# Patient Record
Sex: Male | Born: 1950 | Race: White | Hispanic: No | Marital: Married | Smoking: Never smoker
Health system: Southern US, Community
[De-identification: ages and names within clinical notes are randomized; demographics above are authoritative.]

## PROBLEM LIST (undated history)

## (undated) DIAGNOSIS — I214 Non-ST elevation (NSTEMI) myocardial infarction: Secondary | ICD-10-CM

## (undated) DIAGNOSIS — I639 Cerebral infarction, unspecified: Secondary | ICD-10-CM

## (undated) DIAGNOSIS — I10 Essential (primary) hypertension: Secondary | ICD-10-CM

## (undated) DIAGNOSIS — K8020 Calculus of gallbladder without cholecystitis without obstruction: Secondary | ICD-10-CM

## (undated) DIAGNOSIS — I2510 Atherosclerotic heart disease of native coronary artery without angina pectoris: Secondary | ICD-10-CM

## (undated) DIAGNOSIS — Z8711 Personal history of peptic ulcer disease: Secondary | ICD-10-CM

## (undated) DIAGNOSIS — E669 Obesity, unspecified: Secondary | ICD-10-CM

## (undated) DIAGNOSIS — Z951 Presence of aortocoronary bypass graft: Secondary | ICD-10-CM

## (undated) DIAGNOSIS — B351 Tinea unguium: Secondary | ICD-10-CM

## (undated) DIAGNOSIS — R609 Edema, unspecified: Secondary | ICD-10-CM

## (undated) DIAGNOSIS — E7800 Pure hypercholesterolemia, unspecified: Secondary | ICD-10-CM

## (undated) DIAGNOSIS — Z8719 Personal history of other diseases of the digestive system: Secondary | ICD-10-CM

## (undated) DIAGNOSIS — E119 Type 2 diabetes mellitus without complications: Secondary | ICD-10-CM

## (undated) DIAGNOSIS — N184 Chronic kidney disease, stage 4 (severe): Secondary | ICD-10-CM

## (undated) DIAGNOSIS — I509 Heart failure, unspecified: Secondary | ICD-10-CM

## (undated) DIAGNOSIS — M1990 Unspecified osteoarthritis, unspecified site: Secondary | ICD-10-CM

## (undated) HISTORY — PX: COLONOSCOPY: SHX174

## (undated) HISTORY — DX: Obesity, unspecified: E66.9

## (undated) HISTORY — PX: JOINT REPLACEMENT: SHX530

## (undated) HISTORY — DX: Pure hypercholesterolemia, unspecified: E78.00

## (undated) HISTORY — DX: Essential (primary) hypertension: I10

## (undated) HISTORY — PX: CATARACT EXTRACTION W/ INTRAOCULAR LENS  IMPLANT, BILATERAL: SHX1307

## (undated) HISTORY — PX: ESOPHAGOGASTRODUODENOSCOPY: SHX1529

## (undated) HISTORY — DX: Calculus of gallbladder without cholecystitis without obstruction: K80.20

## (undated) HISTORY — PX: FRACTURE SURGERY: SHX138

---

## 1980-02-06 HISTORY — PX: ANKLE FRACTURE SURGERY: SHX122

## 1993-02-05 HISTORY — PX: VASECTOMY: SHX75

## 1998-03-25 ENCOUNTER — Encounter: Admission: RE | Admit: 1998-03-25 | Discharge: 1998-06-23 | Payer: Self-pay | Admitting: Family Medicine

## 1998-11-19 ENCOUNTER — Encounter: Payer: Self-pay | Admitting: Emergency Medicine

## 1998-11-19 ENCOUNTER — Inpatient Hospital Stay: Admission: EM | Admit: 1998-11-19 | Discharge: 1998-11-23 | Payer: Self-pay | Admitting: Emergency Medicine

## 1998-11-20 ENCOUNTER — Encounter: Payer: Self-pay | Admitting: General Surgery

## 1998-11-21 ENCOUNTER — Encounter: Payer: Self-pay | Admitting: General Surgery

## 1998-11-21 ENCOUNTER — Encounter: Payer: Self-pay | Admitting: Orthopedic Surgery

## 1998-11-22 ENCOUNTER — Encounter: Payer: Self-pay | Admitting: Orthopedic Surgery

## 1998-12-07 HISTORY — PX: PATELLA FRACTURE SURGERY: SHX735

## 1999-02-06 DIAGNOSIS — Z951 Presence of aortocoronary bypass graft: Secondary | ICD-10-CM

## 1999-02-06 DIAGNOSIS — I2510 Atherosclerotic heart disease of native coronary artery without angina pectoris: Secondary | ICD-10-CM

## 1999-02-06 HISTORY — DX: Presence of aortocoronary bypass graft: Z95.1

## 1999-02-06 HISTORY — DX: Atherosclerotic heart disease of native coronary artery without angina pectoris: I25.10

## 1999-02-06 HISTORY — PX: CORONARY ARTERY BYPASS GRAFT: SHX141

## 1999-02-09 ENCOUNTER — Ambulatory Visit (HOSPITAL_COMMUNITY): Admission: RE | Admit: 1999-02-09 | Discharge: 1999-02-09 | Payer: Self-pay | Admitting: Cardiology

## 1999-02-09 HISTORY — PX: CARDIAC CATHETERIZATION: SHX172

## 1999-02-17 ENCOUNTER — Encounter: Payer: Self-pay | Admitting: Cardiothoracic Surgery

## 1999-02-20 ENCOUNTER — Encounter: Payer: Self-pay | Admitting: Cardiothoracic Surgery

## 1999-02-21 ENCOUNTER — Inpatient Hospital Stay (HOSPITAL_COMMUNITY): Admission: RE | Admit: 1999-02-21 | Discharge: 1999-02-26 | Payer: Self-pay | Admitting: Cardiothoracic Surgery

## 1999-02-21 ENCOUNTER — Encounter: Payer: Self-pay | Admitting: Thoracic Surgery (Cardiothoracic Vascular Surgery)

## 1999-02-22 ENCOUNTER — Encounter: Payer: Self-pay | Admitting: Thoracic Surgery (Cardiothoracic Vascular Surgery)

## 1999-02-23 ENCOUNTER — Encounter: Payer: Self-pay | Admitting: Cardiothoracic Surgery

## 1999-02-24 ENCOUNTER — Encounter: Payer: Self-pay | Admitting: Cardiothoracic Surgery

## 1999-02-26 ENCOUNTER — Encounter: Payer: Self-pay | Admitting: Surgery

## 1999-04-17 ENCOUNTER — Encounter: Payer: Self-pay | Admitting: Orthopedic Surgery

## 1999-04-20 ENCOUNTER — Ambulatory Visit (HOSPITAL_COMMUNITY): Admission: RE | Admit: 1999-04-20 | Discharge: 1999-04-21 | Payer: Self-pay | Admitting: Orthopedic Surgery

## 2003-08-06 HISTORY — PX: INCISION AND DRAINAGE ABSCESS: SHX5864

## 2003-08-18 ENCOUNTER — Inpatient Hospital Stay (HOSPITAL_COMMUNITY): Admission: AD | Admit: 2003-08-18 | Discharge: 2003-08-20 | Payer: Self-pay

## 2003-10-16 ENCOUNTER — Inpatient Hospital Stay (HOSPITAL_COMMUNITY): Admission: EM | Admit: 2003-10-16 | Discharge: 2003-10-19 | Payer: Self-pay | Admitting: Emergency Medicine

## 2003-10-18 ENCOUNTER — Encounter (INDEPENDENT_AMBULATORY_CARE_PROVIDER_SITE_OTHER): Payer: Self-pay | Admitting: *Deleted

## 2004-02-06 DIAGNOSIS — I639 Cerebral infarction, unspecified: Secondary | ICD-10-CM

## 2004-02-06 HISTORY — DX: Cerebral infarction, unspecified: I63.9

## 2004-04-20 ENCOUNTER — Ambulatory Visit (HOSPITAL_COMMUNITY): Admission: RE | Admit: 2004-04-20 | Discharge: 2004-04-20 | Payer: Self-pay | Admitting: Gastroenterology

## 2007-06-02 ENCOUNTER — Ambulatory Visit (HOSPITAL_COMMUNITY): Admission: RE | Admit: 2007-06-02 | Discharge: 2007-06-02 | Payer: Self-pay | Admitting: Ophthalmology

## 2007-09-22 ENCOUNTER — Ambulatory Visit (HOSPITAL_COMMUNITY): Admission: RE | Admit: 2007-09-22 | Discharge: 2007-09-22 | Payer: Self-pay | Admitting: Ophthalmology

## 2008-06-05 HISTORY — PX: CARDIOVASCULAR STRESS TEST: SHX262

## 2009-12-07 ENCOUNTER — Ambulatory Visit: Payer: Self-pay | Admitting: Cardiology

## 2009-12-14 ENCOUNTER — Ambulatory Visit: Payer: Self-pay

## 2009-12-14 ENCOUNTER — Encounter: Payer: Self-pay | Admitting: Cardiology

## 2009-12-14 ENCOUNTER — Ambulatory Visit: Payer: Self-pay | Admitting: Cardiology

## 2009-12-14 ENCOUNTER — Ambulatory Visit (HOSPITAL_COMMUNITY): Admission: RE | Admit: 2009-12-14 | Discharge: 2009-12-14 | Payer: Self-pay | Admitting: Cardiology

## 2009-12-15 ENCOUNTER — Encounter: Payer: Self-pay | Admitting: Cardiology

## 2010-06-20 NOTE — Op Note (Signed)
NAME:  Justin Bartlett, Justin Bartlett                ACCOUNT NO.:  [id number]   MEDICAL RECORD NO.:  [id number]          PATIENT TYPE:  AMB   LOCATION:  SDS                          FACILITY:  [facility]   PHYSICIAN:  Alford Highland. Rankin, M.D.   DATE OF BIRTH:  12/24/1950   DATE OF PROCEDURE:  06/02/2007  DATE OF DISCHARGE:                               OPERATIVE REPORT   PREOPERATIVE DIAGNOSES:  1. Nuclear sclerotic cataract of the left eye.  2. Posterior subcapsular cataract left eye.   POSTOPERATIVE DIAGNOSES:  1. Nuclear sclerotic cataract of the left eye.  2. Posterior subcapsular cataract left eye.   PROCEDURE:  Phacoemulsification extracapsular cataract extraction with  posterior chamber intraocular lens placed within the bag, left eye.  Model is Alcon SN60WF, power +21.0.   SURGEON:  Alford Highland. Rankin, MD   ANESTHESIA:  Local retrobulbar, monitored anesthesia control.   INDICATION FOR PROCEDURE:  The patient is a 60 year old man who has  impactive visual loss from dense posterior subcapsular cataract and  cataract symptoms that affect his activities of daily living as well as  monitoring of his posterior fundus diabetic retinopathy of the left eye.  The patient understands this is an attempt to remove the cataract and  place the new lens in place.  He understands the risks of anesthesia  including rare occurrence of death, loss of the eye, including but not  limited to hemorrhage, infection, scarring, need for further surgery, no  change in vision, loss of vision, or progressive disease despite  intervention.   Appropriate signed consent was obtained, the patient was taken to the  operating room.  In the operating room, appropriate monitors followed by  mild sedation. Xylocaine 2% injected, 5 mL retrobulbar with initial 3 mL  injected laterally in a fashion of modified Darel Hong.  The left  periocular region was sterilely prepped and draped in the usual  ophthalmic fashion.  Lid speculum applied.   Biplanar clear corneal  incision was then made with a diamond knife.  The anterior chamber was  deepened with Viscoat.  A bent cystotome needle was used to begin the  capsulorrhexis.  The capsulorrhexis needed to be redirected a couple of  times, but it was joined in the capsule and the anterior capsule was  removed without difficulty.  At this time, hydrodissection and  hydrodelineation in the bag was carried out.  Phaco fragmentation was  then carried out on the bag without difficulty and the nucleus was  rotated, so as to make the lens nucleus into a plate.  The plate was  then brought forward and was removed without difficulty. I/A cleanup was  then carried out without difficulty.  No complications occurred.  Lens  was inserted and rotated in horizontal position with excellent  centration and excellent snap support.  Most of the Viscoat, which had  been used deepen the anterior chamber prior to insertion of the lens,  was just now removed with an I/A.  A solitary 10-0 nylon suture was then  placed for security of the corneal wound.   At this time, subconjunctival  Decadron applied inferonasally.  Sterile  patch and Fox shield applied.  The patient taken to the short-stay area,  to be discharged home as an outpatient.      Alford Highland Rankin, M.D.  Electronically Signed     GAR/MEDQ  D:  06/02/2007  T:  06/03/2007  Job:  [id number]

## 2010-06-20 NOTE — Op Note (Signed)
NAME:  Justin Bartlett, Justin Bartlett                ACCOUNT NO.:  [id number]   MEDICAL RECORD NO.:  [id number]          PATIENT TYPE:  AMB   LOCATION:  SDS                          FACILITY:  [facility]   PHYSICIAN:  Alford Highland. Rankin, M.D.   DATE OF BIRTH:  11/20/1950   DATE OF PROCEDURE:  DATE OF DISCHARGE:  09/22/2007                               OPERATIVE REPORT   PREOPERATIVE DIAGNOSES:  1. Clinically significant macular edema, left eye, progressive.  2. Cystoid macular edema, left eye.  3. Progressive proliferative diabetic retinopathy, left eye.  4. Epiretinal membrane, left eye - internal limiting membrane striae.   POSTOPERATIVE DIAGNOSES:  1. Clinically significant macular edema, left eye, progressive.  2. Cystoid macular edema, left eye.  3. Progressive proliferative diabetic retinopathy, left eye.  4. Epiretinal membrane, left eye - internal limiting membrane striae.   PROCEDURE:  Posterior vitrectomy, membrane peel - internal limiting  membrane - 25 gauge.   SURGEON:  Alford Highland. Rankin, MD.   ANESTHESIA:  Local retrobulbar, monitored anesthesia control.   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  The patient __________ attempt to remove the  vitreous carefully as well as the vitreous detachment to the macular  region and any underlying epiretinal membranes so as to allow for  enhanced oxygenation and perfusion to macular region and potentially  visual field stabilization if not improvement.  He understands the risk  of anesthesia, including rate occurrence of death, loss of the eye  including, but not limited to hemorrhage, infection, scarring, need for  another surgery, no change in vision, loss of vision, and progressive  disease  despite intervention.  Appropriate signed consent was obtained.  The patient was taken to the operating room.  In the operating room,  appropriate monitors followed by mild sedation.  2 % Xylocaine and 5 mL  was injected retrobulbar to the left eye, with additional 5 mL laterally  in the fashion of modified Darel Hong.  A left periocular region was  sterilely prepped and draped in the usual sterile fashion.  Lid speculum  applied.  A 25-gauge trocar was placed on the inferotemporal quadrant.  A superior trocar was applied.  Core traction was then begun.  Vitreoretinal detachment was induced by the physician with suction nasal  to the optic nerve.  At this time, the vitreous skirt was then elevated  to 360 degrees to vitreous base.  Endolaser photocoagulation was placed  on the periphery.   At this time, the internal limiting membrane forceps were then used to  grasp the internal limiting membrane.  This was removed off the macular  region in the left eye without difficulty.   No complications occurred.  Instruments were removed from the eye.  The  superior trocar was removed.  The infusion was removed.  Subconjunctival  Decadron applied.  Sterile patch and Caryn Section  shield were applied.  The  patient tolerated the procedure well without complication.      Alford Highland Rankin, M.D.  Electronically Signed     GAR/MEDQ  D:  09/22/2007  T:  09/23/2007  Job:  [id number]

## 2010-06-23 NOTE — Procedures (Signed)
This is a 60 year old gentleman who has sustained a left brain stroke.  He  has had intermittent episodes of aphasia.  The patient is being evaluated  for possible seizure events.  This is a routine EEG.  No skull defects are  noted.  Medications include glipizide, metoprolol, Lasix, Altace, Lipitor  and heparin.   ELECTROENCEPHALOGRAPHY CLASSIFICATION:  Normal awake.   DESCRIPTION:  According to the background rhythm, this recording consists of  a fairly well-modulated, medium amplitude alpha rhythm of 9 Hz.  It is  reactive to eye opening and closure.  As the record progresses, the patient  appears to remain in a awaken state throughout the entirety of the  recording.  Photic stimulation is performed resulting in a bilateral and  symmetric photic drive response.  Hyperventilation is also performed  resulting in minimal build up of the background rhythm activity without  significant slowing seen.  At no time during the recording does there appear  to be evidence of spike, spike wave discharges or evidence of focal slowing.  The EKG monitor shows no evidence of cardiac arrhythmias with a heart rate  of 72.   IMPRESSION:  This is a normal EEG recording in the awaken state.  No  evidence of ictal or intraictal discharges were seen.    Marlan Palau, M.D.   KYH:CWCB  D:  10/18/2003 09:21:05  T:  10/18/2003 10:06:00  Job #:  [id number]

## 2010-06-23 NOTE — H&P (Signed)
NAME:  Justin Bartlett, Justin Bartlett                          ACCOUNT NO.:  [id number]   MEDICAL RECORD NO.:  [id number]                   PATIENT TYPE:  INP   LOCATION:  1825                                 FACILITY:  [facility]   PHYSICIAN:  Catherine A. Orlin Hilding, M.D.          DATE OF BIRTH:  July 19, 1950   DATE OF ADMISSION:  10/16/2003  DATE OF DISCHARGE:                                HISTORY & PHYSICAL   CHIEF COMPLAINT:  Confusion, language deficit.   HISTORY OF PRESENT ILLNESS:  Justin Bartlett is a 60 year old ambidextrous white  male with history of hypertension, diabetes, coronary artery disease, and  hyperlipidemia.  He was in his usual state of health until he awoke from a  routine nap at about 2 to 3 o'clock today.  He seemed confused to his  daughter.  His language was halting, and he had word finding errors, and he  also had trouble comprehending.  Symptoms lasted two to three ours and have  gradually resolved.   REVIEW OF SYSTEMS:  Positive for mild blurred vision, negative for headache,  weakness, numbness, chest pain, or shortness of breath.   PAST MEDICAL HISTORY:  1.  Recent excision of an abscess behind the left ear.  Just discharged two      days ago.  2.  Hypertension.  3.  Type 2 diabetes.  4.  Coronary artery disease status post five-vessel CABG in the past.  5.  Hyperlipidemia.  6.  Remote left knee surgery.  7.  Remote left ankle surgery.   MEDICATIONS:  1.  Glipizide 5 mg a day.  2.  Metoprolol 100 mg b.i.d.  3.  Furosemide 40 mg daily.  4.  Altace 10 mg daily.  5.  Lipitor 40 mg daily.  6.  Aspirin 325 mg daily.   ALLERGIES:  No known drug allergies.   SOCIAL HISTORY:  He is married.  He is employed.  No cigarette or alcohol  use.   FAMILY HISTORY:  Positive for stroke and hypertension.   OBJECTIVE:  VITAL SIGNS:  On physical examination, temperature 98.2, respirations 24,  blood pressure 169/75, pulse 89.   White blood cell count is normal.  Pro time 12.2, INR  0.9.  Hemoglobin,  hematocrit, platelets all normal.  PTT 27.  ISTAT is normal except for  glucose of 324.   HEAD AND NECK: Head is normocephalic and atraumatic.  Neck is supple.  There  is a quiet bruit on the left in ICA region.  He has a healing surgical wound  in the posterior auricular region.  CARDIAC:  Heart is regular rate and rhythm, no murmurs.  LUNGS:  Clear to auscultation.  ABDOMEN:  Benign.  EXTREMITIES:  Without edema.  NEUROLOGIC:  Mental status:  He is awake and alert and oriented.  He mild  language hesitancy with reading and describing a scene.  He is able to name  and repeat without difficulty.  He follows simple commands but has a little  bit of difficulty with complex commands.  Cranial nerves:  His pupils are  equal and reactive. Visual fields are full.  Extraocular movements are  intact.  Facial sensation is normal.  Facial motor activity is normal.  Hearing is intact.  Palate is symmetric, and tongue is midline. On motor  exam, he has no drift, no satellite, normal rapid fine movements.  Normal  bulk, tone, and strength throughout.  Reflexes are 2+ in the upper  extremities and knees, trace at the ankles, downgoing toes.  Coordination:  Finger-to-nose, heel-to-shin are intact.  Sensory exam is intact.   LABORATORY AND X-RAY DATA:  CT scan of the brain shows no definite acute  abnormalities but does show evidence of old small vessel ischemia.   IMPRESSION:  Transient aphasia, transient ischemic attack versus small  stroke.  Need to rule out left carotid disease or perhaps cardiac source.  As he was born left handed, it is also possible he could have something on  the right side.  He also has multiple risk factors for stroke and has been  on aspirin.   PLAN:  1.  Admit to the stroke service for evaluation.  2.  Will put him on Aggrenox.  3.  MRI of the brain.  4.  Extracranial MR angiography.  5.  2-D echocardiogram.  6.  Carotid ultrasound  7.   Homocysteine level.                                                Catherine A. Orlin Hilding, M.D.    CAW/MEDQ  D:  10/16/2003  T:  10/16/2003  Job:  [id number]   cc:   Bryan Lemma. Manus Gunning, M.D.  [address] [place]  [place] [address]  Fax: [phone]
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
## 2010-06-23 NOTE — Discharge Summary (Signed)
NAME:  Justin Bartlett, Justin Bartlett                          ACCOUNT NO.:  [id number]   MEDICAL RECORD NO.:  [id number]                   PATIENT TYPE:  INP   LOCATION:  3034                                 FACILITY:  [facility]   PHYSICIAN:  Pramod P. Pearlean Brownie, MD                 DATE OF BIRTH:  1950-03-28   DATE OF ADMISSION:  10/16/2003  DATE OF DISCHARGE:  10/19/2003                                 DISCHARGE SUMMARY   DISCHARGE DIAGNOSES:  1.  Acute left posterior temporal infarct.  2.  Type 2 diabetes, poorly controlled.  3.  Hypertension.  4.  Coronary artery disease status post five vessel coronary artery bypass      graft in the past.  5.  Hyperlipidemia.  6.  Remote left knee surgery.  7.  Remote left ankle surgery.  8.  Recent excision of abscess behind left ear.   DISCHARGE MEDICATIONS:  1.  Glucotrol 5 mg daily.  2.  Lopressor 100 mg b.i.d.  3.  Altace 10 mg daily.  4.  Lasix 40 mg daily.  5.  Lipitor 40 mg daily.  6.  Aggrenox one p.o. daily x14 days, then increased to b.i.d.   STUDIES PERFORMED:  1.  Chest x-ray.  No active disease.  2.  CT of the head on admission shows chronic small vessel type changes, no      acute findings.  3.  MRI of the brain shows several small foci of acute to subacute      infarctions in the left posterior temporal lobe, chronic microvascular      changes elsewhere in the deep white matter and subcortical hemisphere      white matter.  4.  MRA of the neck shows no evidence of carotid bifurcation on either side.      __________ of the left common carotid artery origin from the arch,      question artifact or stenosis, right vertebral artery severely diseased      and nearly occluded, left vertebral artery shows an origin stenosis 30-      50%.  5.  MRA of the head shows no intracranial anterior circulation disease.      There is a thready distal right vertebral artery probably due to      acquired atherosclerotic disease.  6.  EEG is normal.  7.  EKG  shows normal sinus rhythm with age undetermined of a septal infarct,      T-wave abnormality.  Consider a lateral ischemia with no significant      change since last tracing per cardiology.  8.  2-D echocardiogram shows ejection fraction of 50-55% with left      ventricular mildly dilated, no obvious embolic source.  Poor acoustic      window does limit study.  9.  A transesophageal echocardiogram performed by Dr. Reyes Ivan with no      complications showed  no source of cardioembolic source.  The interatrial      septal wall was intact.  There was no shunting and there was no      vegetation.  10. Carotid Doppler shows left 40-60% in the low to mid range, right no      internal carotid artery stenosis with the right vertebral occluded and      the left vertebral antegrade.   LABORATORIES:  White blood cells up to 10.6 day of discharge up from 8.8,  otherwise CBC normal.  Differential normal.  Coagulation studies normal.  Chemistry normal except for elevated glucoses at 318 and 324.  Liver  function tests normal.  Hemoglobin A1C 11.5.  Homocysteine 8.95.  Cholesterol 143, triglycerides 177, HDL 34, LDL 74.  Urine drug screen was  negative.  UA showed greater than 1000 mg of glucose with a 1.040 specific  gravity, otherwise UA negative.   HISTORY OF PRESENT ILLNESS:  Justin Bartlett is a 60 year old ambidextrous white  male with history of hypertension, diabetes, coronary artery disease, and  hyperlipidemia.  He was in his usual state of health until he woke from his  routine nap on the day of admission some time between 2:00-3:00.  He seemed  confused to his daughter.  His language was halting and he had word finding  errors and also problems comprehending.  Symptoms lasted two to three hours  and have gradually resolved.  He was admitted to the hospital for further  work-up.  He was not a TPA or a [name] candidate secondary to lack of  severity of symptoms.   HOSPITAL COURSE:  MRI did  reveal acute infarcts in the left posterotemporal  area, embolic-appearing.  EKG and telemetry showed no abnormal rhythms.  A  carotid Doppler did show unrelated left ICA stenosis 40-60%.  A TEE and a 2-  D echocardiogram were both negative for embolic source.  As no embolic  source was found, patient was discharged on Aggrenox.  Will start at one a  day and increase to two a day for headache prevention.   Patient is a diabetic and had a hemoglobin A1C that was extremely high.  He  needs tight glucose control and monitoring as an outpatient.  This was  discussed with him and he verbalizes understanding, but finds it difficult  to do.  He will continue on statin for his elevated cholesterol and also  continue tight hypertension control.   CONDITION ON DISCHARGE:  Neurologically normal.  Last aphasia episode was  some time day prior to discharge for one to two minutes.  No focal deficit  otherwise.   PLAN:  1.  Discharged home with family.  2.  Aggrenox for secondary stroke prevention, dose as above.  3.  Follow up with Dr. Manus Bartlett within the next month.  Risk factor control      is imperative, especially as it relates to diabetes.  4.  Follow up with Dr. Delia Heady in two to three months, call for an      appointment.      Annie Main, N.P.                         Pramod P. Pearlean Brownie, MD    SB/MEDQ  D:  10/19/2003  T:  10/19/2003  Job:  [id number]   cc:   Justin Bartlett, M.D.  [address] [place]  [place] [address]  Fax: [phone]

## 2010-06-23 NOTE — Op Note (Signed)
NAME:  Justin Bartlett, Justin Bartlett                ACCOUNT NO.:  [id number]   MEDICAL RECORD NO.:  [id number]          PATIENT TYPE:  AMB   LOCATION:  ENDO                         FACILITY:  [facility]   PHYSICIAN:  James L. Malon Kindle., M.D.DATE OF BIRTH:  July 17, 1950   DATE OF PROCEDURE:  04/20/2004  DATE OF DISCHARGE:                                 OPERATIVE REPORT   PROCEDURE:  Colonoscopy.   MEDICATIONS:  Fentanyl 75 mcg, Versed 10.5 mg IV.   INDICATIONS FOR PROCEDURE:  Strong family history of colon cancer in a  parent.   DESCRIPTION OF PROCEDURE:  The procedure was explained to the patient and  consent obtained.  In the left lateral decubitus position, an Olympus  adjustable scope was inserted and advanced.  We were able to advance easily  to the cecum, ileocecal valve and appendiceal orifice were seen.  The scope  was withdrawn and the cecum, ascending colon, transverse colon, splenic  flexure, descending, and sigmoid colon were seen well.  No polyps or other  lesions were seen.  The scope was withdrawn in the rectum and the rectum was  free of polyps.  The patient tolerated the procedure well.   ASSESSMENT:  1.  Normal colonoscopy in a gentleman with a strong family history of colon      cancer in a parent.  V16.0   PLAN:  Will recommend repeating procedure in 5 years and recommend yearly  hemoccults.      JLE/MEDQ  D:  04/20/2004  T:  04/20/2004  Job:  [id number]   cc:   Bryan Lemma. Manus Gunning, M.D.  [address] [place]  [place] [address]  Fax: [phone]
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
## 2010-06-23 NOTE — Op Note (Signed)
NAME:  Justin Bartlett, Justin Bartlett NO.:  [id number]   MEDICAL RECORD NO.:  [id number]                   PATIENT TYPE:  AMB   LOCATION:  DAY                                  FACILITY:  [facility]   PHYSICIAN:  Lorre Munroe., M.D.            DATE OF BIRTH:  [birth year]   DATE OF PROCEDURE:  08/18/2003  DATE OF DISCHARGE:                                 OPERATIVE REPORT   PREOPERATIVE DIAGNOSIS:  Large abscess on the left side of neck.   POSTOPERATIVE DIAGNOSIS:  Large abscess on the left side of neck.   OPERATION/PROCEDURE:  Incision and drainage of abscess.   SURGEON:  Lebron Conners, M.D.   ANESTHESIA:  General.   DESCRIPTION OF PROCEDURE:  After the patient was monitored and anesthetized  and positioned in the lateral position, I thoroughly explored the abscess.  Pus was draining at one point.  I took a culture.  I plunged the hemostat  into that area and much more pus came out.  I could feel severe induration  in several directions going anteriorly to about the level of the  sternocleidomastoid muscle, posteriorly near the midline, inferiorly near  the clavicle, and cephalad up near the base of the skull.  We made a total  of four incisions and using the hemostat, broke up ramifications and  loculations of the abscess, draining a large amount of pus.  I passed  Penrose drains through and secured them with safety pins.  I applied a large  bulky bandage.  The patient was stable through the procedure.                                               Lorre Munroe., M.D.    WB/MEDQ  D:  08/18/2003  T:  08/18/2003  Job:  [id number]

## 2010-07-06 ENCOUNTER — Ambulatory Visit: Payer: Self-pay | Admitting: Cardiology

## 2010-07-31 ENCOUNTER — Encounter: Payer: Self-pay | Admitting: Cardiology

## 2010-08-07 ENCOUNTER — Ambulatory Visit (INDEPENDENT_AMBULATORY_CARE_PROVIDER_SITE_OTHER): Payer: BC Managed Care – PPO | Admitting: Cardiology

## 2010-08-07 ENCOUNTER — Encounter: Payer: Self-pay | Admitting: Cardiology

## 2010-08-07 VITALS — BP 150/76 | HR 68 | Wt 280.0 lb

## 2010-08-07 DIAGNOSIS — E6609 Other obesity due to excess calories: Secondary | ICD-10-CM | POA: Insufficient documentation

## 2010-08-07 DIAGNOSIS — E119 Type 2 diabetes mellitus without complications: Secondary | ICD-10-CM

## 2010-08-07 DIAGNOSIS — I259 Chronic ischemic heart disease, unspecified: Secondary | ICD-10-CM | POA: Insufficient documentation

## 2010-08-07 DIAGNOSIS — E669 Obesity, unspecified: Secondary | ICD-10-CM

## 2010-08-07 DIAGNOSIS — I119 Hypertensive heart disease without heart failure: Secondary | ICD-10-CM

## 2010-08-07 DIAGNOSIS — E7800 Pure hypercholesterolemia, unspecified: Secondary | ICD-10-CM | POA: Insufficient documentation

## 2010-08-07 DIAGNOSIS — I519 Heart disease, unspecified: Secondary | ICD-10-CM

## 2010-08-07 NOTE — Assessment & Plan Note (Signed)
The patient is now being followed by Dr. Sharl Ma for his diabetes.  The patient is on insulin.  He's not having any hypoglycemic episodes.

## 2010-08-07 NOTE — Assessment & Plan Note (Signed)
The patient unfortunately has gained back a lot of the weight that he was able to lose last year.  When he was on a Nutrisystem diet he lost considerable weight but now his weight is back up 33 pounds in 6 months.

## 2010-08-07 NOTE — Progress Notes (Signed)
Justin Bartlett Date of Birth:  02/07/1950 [facility] [address]   [address] [place], [place]  [address] [phone]           Fax   [phone]  History of Present Illness: This pleasant 60 year old gentleman is seen for a six-month followup office visit.  He is very discouraged that he gained back 33 pounds when he went off a Nutrisystem diet program.  He went off it because they were no longer able to afford it.  He will be speaking with his diabetologist about going on a weight reduction diabetic diet patient has not been expressing any chest pain or shortness of breath.  He's not been aware of any palpitations or arrhythmia.  Since we last saw him he had successful cataract surgery on the left eye by Dr. Alden Hipp per the patient continues to exercise regularly using his treadmill at home.  He does have a known left carotid bruit but has had no TIA symptoms and has had several prior ultrasounds of his carotid by Dr. Pearlean Brownie.  Current Outpatient Prescriptions  Medication Sig Dispense Refill  . diltiazem (CARDIZEM CD) 240 MG 24 hr capsule Take 240 mg by mouth daily.        Marland Kitchen dipyridamole-aspirin (AGGRENOX) 25-200 MG per 12 hr capsule Take 1 capsule by mouth 2 (two) times daily.        Marland Kitchen ezetimibe-simvastatin (VYTORIN) 10-80 MG per tablet Take 1 tablet by mouth at bedtime.        . insulin regular (HUMULIN R,NOVOLIN R) 100 units/mL injection Inject into the skin 3 (three) times daily before meals. 14units tid Dr. Jamison Oka       . metoprolol (LOPRESSOR) 100 MG tablet Take 100 mg by mouth 2 (two) times daily.        . ramipril (ALTACE) 10 MG tablet Take 10 mg by mouth daily.        Marland Kitchen DISCONTD: chlorthalidone (HYGROTON) 25 MG tablet Take 25 mg by mouth daily.        Marland Kitchen DISCONTD: insulin detemir (LEVEMIR) 100 UNIT/ML injection Inject into the skin at bedtime.          No Known Allergies  Patient Active Problem List  Diagnoses  . Ischemic heart disease  . Benign  hypertensive heart disease without heart failure  . Diabetes mellitus  . Exogenous obesity  . Hypercholesterolemia    History  Smoking status  . Never Smoker   Smokeless tobacco  . Not on file    History  Alcohol Use No    Family History  Problem Relation Age of Onset  . Colon cancer Mother   . Heart attack Father   . Hypertension Father   . Hypertension Sister   . Diabetes Sister     Review of Systems: Constitutional: no fever chills diaphoresis or fatigue or change in weight.  Head and neck: no hearing loss, no epistaxis, no photophobia or visual disturbance. Respiratory: No cough, shortness of breath or wheezing. Cardiovascular: No chest pain peripheral edema, palpitations. Gastrointestinal: No abdominal distention, no abdominal pain, no change in bowel habits hematochezia or melena. Genitourinary: No dysuria, no frequency, no urgency, no nocturia. Musculoskeletal:No arthralgias, no back pain, no gait disturbance or myalgias. Neurological: No dizziness, no headaches, no numbness, no seizures, no syncope, no weakness, no tremors. Hematologic: No lymphadenopathy, no easy bruising. Psychiatric: No confusion, no hallucinations, no sleep disturbance.    Physical Exam: Filed Vitals:   08/07/10 1357  BP:  150/76  Pulse: 68  The general appearance reveals a large gentleman in no acute distress.Pupils equal and reactive.   Extraocular Movements are full.  There is no scleral icterus.  The mouth and pharynx are normal.  The neck is supple.  The carotids reveal no bruits.  The jugular venous pressure is normal.  The thyroid is not enlarged.  There is no lymphadenopathy.The chest is clear to percussion and auscultation. There are no rales or rhonchi. Expansion of the chest is symmetrical.The precordium is quiet.  The first heart sound is normal.  The second heart sound is physiologically split.  There is no murmur gallop rub or click.  There is no abnormal lift or heave.  The  abdomen is soft and nontender. Bowel sounds are normal. The liver and spleen are not enlarged. There Are no abdominal masses. There are no bruits.The pedal pulses are good.  There is no phlebitis or edema.  There is no cyanosis or clubbing.Strength is normal and symmetrical in all extremities.  There is no lateralizing weakness.  There are no sensory deficits.The skin is warm and dry.  There is no rash.  His electrogram shows normal sinus rhythm and a pattern of left ventricular strain and an old anteroseptal myocardial infarction.  His been no change since may of 2011. Assessment / Plan:  Continue same medication recheck in 6 months for followup office visit.  He will determined to work harder on a low-calorie Mediterranean type diet

## 2010-08-07 NOTE — Assessment & Plan Note (Signed)
The patient has a past history of known ischemic heart disease.  He had a anteroseptal myocardial infarction in 2001 and underwent coronary artery bypass graft surgery.  His last nuclear stress test was 06/23/08 and showed an old and recent apical infarct with minimal reversible peri-infarct ischemia and he had LV systolic dysfunction with an ejection fraction of 41%.  The patient has not been expressing any recurrent chest pain.  He exercises regularly.

## 2010-08-08 ENCOUNTER — Encounter: Payer: Self-pay | Admitting: Cardiology

## 2010-10-31 LAB — BASIC METABOLIC PANEL
CO2: 27
Calcium: 9.6
GFR calc Af Amer: 48 — ABNORMAL LOW
Glucose, Bld: 161 — ABNORMAL HIGH

## 2010-10-31 LAB — CBC
HCT: 39.8
Hemoglobin: 13.1
MCHC: 32.9
RDW: 13.8

## 2010-11-03 LAB — BASIC METABOLIC PANEL
BUN: 25 — ABNORMAL HIGH
CO2: 25
Chloride: 108
Glucose, Bld: 187 — ABNORMAL HIGH
Potassium: 5.2 — ABNORMAL HIGH

## 2010-11-03 LAB — CBC
HCT: 39.8
MCV: 90.6
Platelets: 200
RDW: 14.1

## 2011-03-05 ENCOUNTER — Encounter: Payer: Self-pay | Admitting: Cardiology

## 2011-03-05 ENCOUNTER — Ambulatory Visit (INDEPENDENT_AMBULATORY_CARE_PROVIDER_SITE_OTHER): Payer: BC Managed Care – PPO | Admitting: Cardiology

## 2011-03-05 VITALS — BP 118/70 | HR 60 | Ht 72.0 in | Wt 283.0 lb

## 2011-03-05 DIAGNOSIS — I259 Chronic ischemic heart disease, unspecified: Secondary | ICD-10-CM

## 2011-03-05 DIAGNOSIS — E7800 Pure hypercholesterolemia, unspecified: Secondary | ICD-10-CM

## 2011-03-05 DIAGNOSIS — E119 Type 2 diabetes mellitus without complications: Secondary | ICD-10-CM

## 2011-03-05 DIAGNOSIS — I119 Hypertensive heart disease without heart failure: Secondary | ICD-10-CM

## 2011-03-05 NOTE — Assessment & Plan Note (Signed)
The patient has a past history of hypercholesterolemia.  He is on Vytorin 10/40/80 one daily.  He is not having any myalgias or side effects from the Vytorin.

## 2011-03-05 NOTE — Assessment & Plan Note (Signed)
The patient denies any hypoglycemic episodes.  He reports that his last A1c was 8.3.

## 2011-03-05 NOTE — Progress Notes (Signed)
Justin Bartlett Date of Birth:  1950-10-22 [facility] [address] [place], [place]  [address] [phone]         Fax   [phone]  History of Present Illness: This pleasant 61 year old gentleman is seen for a six-month followup office visit.  He has a past history of ischemic heart disease.  He had an anteroseptal myocardial infarction in 2001 and underwent coronary artery bypass graft surgery at that time.  His last nuclear stress test was 06/23/08 and showed an old apical infarct with minimal reversible.  Infarct ischemia.  His ejection fraction was 41%.  Patient has a history of diabetes mellitus and is followed by Dr. Sharl Ma and is on insulin.  Patient has had a difficult time with inability to lose weight.  Since we last saw him he has had some problems with retinal disease and has surgery with Dr. Luciana Axe scheduled for next week on his right eye.  Current Outpatient Prescriptions  Medication Sig Dispense Refill  . diltiazem (CARDIZEM CD) 240 MG 24 hr capsule Take 240 mg by mouth daily.        Marland Kitchen dipyridamole-aspirin (AGGRENOX) 25-200 MG per 12 hr capsule Take 1 capsule by mouth 2 (two) times daily.        Marland Kitchen ezetimibe-simvastatin (VYTORIN) 10-80 MG per tablet Take 1 tablet by mouth at bedtime.        . insulin regular (HUMULIN R,NOVOLIN R) 100 units/mL injection Inject into the skin 3 (three) times daily before meals. 14units tid Dr. Jamison Oka       . itraconazole (SPORANOX) 10 MG/ML solution Take 200 mg by mouth daily. As directed      . metoprolol (LOPRESSOR) 100 MG tablet Take 100 mg by mouth 2 (two) times daily.        . ramipril (ALTACE) 10 MG tablet Take 10 mg by mouth daily.        . Insulin Syringe-Needle U-100 (INSULIN SYRINGE .3CC/29GX1/2") 29G X 1/2" 0.3 ML MISC Inject as directed as directed.        No Known Allergies  Patient Active Problem List  Diagnoses  . Ischemic heart disease  . Benign hypertensive heart disease without heart failure  . Diabetes  mellitus  . Exogenous obesity  . Hypercholesterolemia    History  Smoking status  . Never Smoker   Smokeless tobacco  . Not on file    History  Alcohol Use No    Family History  Problem Relation Age of Onset  . Colon cancer Mother   . Heart attack Father   . Hypertension Father   . Hypertension Sister   . Diabetes Sister     Review of Systems: Constitutional: no fever chills diaphoresis or fatigue or change in weight.  Head and neck: no hearing loss, no epistaxis, no photophobia or visual disturbance. Respiratory: No cough, shortness of breath or wheezing. Cardiovascular: No chest pain peripheral edema, palpitations. Gastrointestinal: No abdominal distention, no abdominal pain, no change in bowel habits hematochezia or melena. Genitourinary: No dysuria, no frequency, no urgency, no nocturia. Musculoskeletal:No arthralgias, no back pain, no gait disturbance or myalgias. Neurological: No dizziness, no headaches, no numbness, no seizures, no syncope, no weakness, no tremors. Hematologic: No lymphadenopathy, no easy bruising. Psychiatric: No confusion, no hallucinations, no sleep disturbance.    Physical Exam: Filed Vitals:   03/05/11 0916  BP: 118/70  Pulse: 60   The general appearance reveals an overweight gentleman in no distress.  His weight is up  3 pounds since last visit.Pupils equal and reactive.   Extraocular Movements are full.  There is no scleral icterus.  The mouth and pharynx are normal.  The neck is supple.  The carotids reveal no bruits.  The jugular venous pressure is normal.  The thyroid is not enlarged.  There is no lymphadenopathy.  The chest is clear to percussion and auscultation. There are no rales or rhonchi. Expansion of the chest is symmetrical.  The precordium is quiet.  The first heart sound is normal.  The second heart sound is physiologically split.  There is no murmur gallop rub or click.  There is no abnormal lift or heave.  The abdomen is  soft and nontender. Bowel sounds are normal. The liver and spleen are not enlarged. There Are no abdominal masses. There are no bruits.  Extremities show 1+ ankle edema.Strength is normal and symmetrical in all extremities.  There is no lateralizing weakness.  There are no sensory deficits.  The skin is warm and dry.  There is no rash.   Assessment / Plan: Continue same medication.  Recheck in 6 months for followup office visit and EKG.  Work harder on weight loss.  He will be meeting with Dr. Reyes Ivan nurse educator regarding his diet in several weeks.  He is on a strict low-salt diet and is trying to use a lot of fresh fruits and vegetables.

## 2011-03-05 NOTE — Assessment & Plan Note (Signed)
Patient has a past history of high blood pressure.  He has not been having any dizziness or syncope.

## 2011-03-05 NOTE — Assessment & Plan Note (Signed)
The patient has not been having any recurrent angina pectoris.  He does try to exercise on a regular basis.  He has a treadmill and his urologist and he also has some light weights that he uses.

## 2011-03-05 NOTE — Patient Instructions (Signed)
Your physician recommends that you continue on your current medications as directed. Please refer to the Current Medication list given to you today.  Your physician wants you to follow-up in: 6 months. You will receive a reminder letter in the mail two months in advance. If you don't receive a letter, please call our office to schedule the follow-up appointment.  

## 2011-04-03 ENCOUNTER — Other Ambulatory Visit: Payer: Self-pay | Admitting: Internal Medicine

## 2011-04-06 ENCOUNTER — Ambulatory Visit
Admission: RE | Admit: 2011-04-06 | Discharge: 2011-04-06 | Disposition: A | Discharge status: Home / Self Care | Payer: BC Managed Care – PPO | Source: Ambulatory Visit | Attending: Internal Medicine | Admitting: Internal Medicine

## 2011-05-02 ENCOUNTER — Ambulatory Visit (INDEPENDENT_AMBULATORY_CARE_PROVIDER_SITE_OTHER): Payer: BC Managed Care – PPO | Admitting: General Surgery

## 2011-05-02 ENCOUNTER — Encounter (INDEPENDENT_AMBULATORY_CARE_PROVIDER_SITE_OTHER): Payer: Self-pay | Admitting: General Surgery

## 2011-05-02 VITALS — BP 138/90 | HR 68 | Temp 97.1°F | Resp 16 | Ht 71.0 in | Wt 265.6 lb

## 2011-05-02 DIAGNOSIS — R7989 Other specified abnormal findings of blood chemistry: Secondary | ICD-10-CM | POA: Insufficient documentation

## 2011-05-02 NOTE — Progress Notes (Signed)
Patient ID: Justin Bartlett, male   DOB: 11/23/1950, 61 y.o.   MRN: [id number]  Chief Complaint  Patient presents with  . Abdominal Pain    new pt- ebal gallstones    HPI Justin Bartlett is a 61 y.o. male.   HPI 61 year old obese Caucasian male referred by Dr. Manus Gunning for evaluation of gallstones. The patient had routine lab work done in end of February which revealed some elevated liver function tests. This prompted additional workup. He had an abdominal ultrasound which showed a gallstone. He denies any abdominal pain. He denies any epigastric or right upper quadrant pain. He denies any bloating or indigestion. He denies any postprandial abdominal pain. He denies any colicky abdominal pain. He denies any weight loss. He states the only thing that he has noticed recently is that he is having more episodes of loose stool. He states he also has been having some itchy skin. His mother has had colon cancer. He reports having several colonoscopies in the past which have been normal. He denies any reflux.  Past Medical History  Diagnosis Date  . Hypertension   . Diabetes mellitus     insulin dependent  . Coronary artery disease   . Gallstones   . Hypercholesterolemia   . Chronic kidney disease     Past Surgical History  Procedure Date  . Cardiac catheterization 02/09/1999    LARGE AREA OF ANTERIOR AND APICAL  HYPOKINESIS PRESENT. EF 35%  . Cardiovascular stress test 06/2008    EF 41%  . Coronary artery bypass graft 2001  . Ankle surgery     Family History  Problem Relation Age of Onset  . Colon cancer Mother   . Cancer Mother     colon  . Heart attack Father   . Hypertension Father   . Hypertension Sister   . Diabetes Sister     Social History History  Substance Use Topics  . Smoking status: Never Smoker   . Smokeless tobacco: Not on file  . Alcohol Use: No    No Known Allergies  Current Outpatient Prescriptions  Medication Sig Dispense Refill  . diltiazem (CARDIZEM CD)  240 MG 24 hr capsule Take 240 mg by mouth daily.        Marland Kitchen dipyridamole-aspirin (AGGRENOX) 25-200 MG per 12 hr capsule Take 1 capsule by mouth 2 (two) times daily.        Marland Kitchen ezetimibe-simvastatin (VYTORIN) 10-80 MG per tablet Take 1 tablet by mouth at bedtime.        . insulin regular (HUMULIN R,NOVOLIN R) 100 units/mL injection Inject into the skin 3 (three) times daily before meals. 14units tid Dr. Jamison Oka       . Insulin Syringe-Needle U-100 (INSULIN SYRINGE .3CC/29GX1/2") 29G X 1/2" 0.3 ML MISC Inject as directed as directed.      . itraconazole (SPORANOX) 10 MG/ML solution 200 mg daily. As directed      . metoprolol (LOPRESSOR) 100 MG tablet Take 100 mg by mouth 2 (two) times daily.        . ramipril (ALTACE) 10 MG tablet Take 10 mg by mouth daily.          Review of Systems Review of Systems  Constitutional: Negative for fever, chills, appetite change and unexpected weight change.  HENT: Negative for congestion and trouble swallowing.   Eyes: Negative for visual disturbance.  Respiratory: Negative for chest tightness and shortness of breath.   Cardiovascular: Negative for chest pain and leg swelling.  No PND, no orthopnea, no DOE  Gastrointestinal:       See HPI  Genitourinary: Negative for dysuria and hematuria.  Musculoskeletal: Negative.   Skin: Negative for rash.       Itchy skin  Neurological: Negative for seizures and speech difficulty.  Hematological: Does not bruise/bleed easily.       Has had more hypoglycemic episodes recently. States his blood sugars are sometimes in the 40s when he gets up in the morning  Psychiatric/Behavioral: Negative for behavioral problems and confusion.    Blood pressure 138/90, pulse 68, temperature 97.1 F (36.2 C), temperature source Temporal, resp. rate 16, height 5\' 11"  (1.803 m), weight 265 lb 9.6 oz (120.475 kg).  Physical Exam Physical Exam  Vitals reviewed. Constitutional: He is oriented to person, place, and time. He appears  well-developed and well-nourished. No distress.       Obese, central truncal obesity  HENT:  Head: Normocephalic and atraumatic.  Right Ear: External ear normal.  Left Ear: External ear normal.  Eyes: EOM are normal. Scleral icterus (possible mild scleral icterus) is present.  Neck: Neck supple. No tracheal deviation present. No thyromegaly present.  Cardiovascular: Normal rate, regular rhythm and normal heart sounds.   Pulmonary/Chest: Effort normal and breath sounds normal. No respiratory distress. He has no wheezes.    Abdominal: Soft. Bowel sounds are normal. He exhibits no distension. There is no tenderness.       obese  Musculoskeletal: Normal range of motion. He exhibits no edema and no tenderness.  Neurological: He is alert and oriented to person, place, and time.  Skin: Skin is dry. He is not diaphoretic.       ?bronze skin  Psychiatric: He has a normal mood and affect. His behavior is normal. Judgment and thought content normal.    Data Reviewed Dr Randel Books note from 2/22 Dr Daune Perch note from 1/22 Normal LFTs 09/27/10 LFTs 03/30/11:   tbili 1.1; alkphos 414, AST 98, ALT 154 Reportedly hep A, hep B, hep C were negative-although I do not have a copy of these  Assessment    Cholelithiasis Elevated LFTs    Plan    I believe he has asymptomatic cholelithiasis. I do not believe his elevated LFTs are due to gallbladder disease. He has no symptoms which I can elicit that are related to gallbladder disease. I believe his elevated LFTs may be due to fatty liver infiltration due to his obesity or possibly secondary to a medication. I will refer him to Dr Vilinda Boehringer for evaluation.   F/u prn [name]. [name], MD, FACS General, Bariatric, & Minimally Invasive Surgery [facility], [place]        [facility] M 05/02/2011, 10:15 AM

## 2011-05-02 NOTE — Patient Instructions (Signed)
I do not believe your gallbladder is the source of your elevated LFTs. We will make an appt to see Dr Randa Evens

## 2011-06-01 ENCOUNTER — Other Ambulatory Visit: Payer: Self-pay | Admitting: Gastroenterology

## 2011-06-01 DIAGNOSIS — R945 Abnormal results of liver function studies: Secondary | ICD-10-CM

## 2011-06-07 ENCOUNTER — Ambulatory Visit
Admission: RE | Admit: 2011-06-07 | Discharge: 2011-06-07 | Disposition: A | Discharge status: Home / Self Care | Payer: BC Managed Care – PPO | Source: Ambulatory Visit | Attending: Gastroenterology | Admitting: Gastroenterology

## 2011-06-07 DIAGNOSIS — R945 Abnormal results of liver function studies: Secondary | ICD-10-CM

## 2011-06-07 MED ORDER — GADOBENATE DIMEGLUMINE 529 MG/ML IV SOLN
10.0000 mL | Freq: Once | INTRAVENOUS | Status: AC | PRN
  Administered 2011-06-07: 10 mL via INTRAVENOUS

## 2011-06-13 ENCOUNTER — Encounter (HOSPITAL_COMMUNITY)
Admission: RE | Disposition: A | Discharge status: Home / Self Care | Payer: Self-pay | Source: Ambulatory Visit | Attending: Gastroenterology

## 2011-06-13 ENCOUNTER — Encounter (HOSPITAL_COMMUNITY): Payer: Self-pay | Admitting: Anesthesiology

## 2011-06-13 ENCOUNTER — Ambulatory Visit (HOSPITAL_COMMUNITY): Payer: BC Managed Care – PPO | Admitting: Anesthesiology

## 2011-06-13 ENCOUNTER — Encounter (HOSPITAL_COMMUNITY): Payer: Self-pay | Admitting: *Deleted

## 2011-06-13 ENCOUNTER — Ambulatory Visit (HOSPITAL_COMMUNITY): Payer: BC Managed Care – PPO

## 2011-06-13 ENCOUNTER — Ambulatory Visit (HOSPITAL_COMMUNITY)
Admission: RE | Admit: 2011-06-13 | Discharge: 2011-06-13 | Disposition: A | Discharge status: Home / Self Care | Payer: BC Managed Care – PPO | Source: Ambulatory Visit | Attending: Gastroenterology | Admitting: Gastroenterology

## 2011-06-13 DIAGNOSIS — K8050 Calculus of bile duct without cholangitis or cholecystitis without obstruction: Secondary | ICD-10-CM | POA: Insufficient documentation

## 2011-06-13 DIAGNOSIS — I10 Essential (primary) hypertension: Secondary | ICD-10-CM | POA: Insufficient documentation

## 2011-06-13 DIAGNOSIS — R7989 Other specified abnormal findings of blood chemistry: Secondary | ICD-10-CM | POA: Insufficient documentation

## 2011-06-13 DIAGNOSIS — I2510 Atherosclerotic heart disease of native coronary artery without angina pectoris: Secondary | ICD-10-CM | POA: Insufficient documentation

## 2011-06-13 DIAGNOSIS — E119 Type 2 diabetes mellitus without complications: Secondary | ICD-10-CM | POA: Insufficient documentation

## 2011-06-13 DIAGNOSIS — Z794 Long term (current) use of insulin: Secondary | ICD-10-CM | POA: Insufficient documentation

## 2011-06-13 HISTORY — DX: Personal history of other diseases of the digestive system: Z87.19

## 2011-06-13 HISTORY — DX: Personal history of peptic ulcer disease: Z87.11

## 2011-06-13 HISTORY — PX: BILIARY STENT PLACEMENT: SHX5538

## 2011-06-13 HISTORY — PX: ERCP: SHX5425

## 2011-06-13 HISTORY — PX: SPHINCTEROTOMY: SHX5544

## 2011-06-13 HISTORY — DX: Cerebral infarction, unspecified: I63.9

## 2011-06-13 LAB — GLUCOSE, CAPILLARY
Glucose-Capillary: 153 mg/dL — ABNORMAL HIGH (ref 70–99)
Glucose-Capillary: 212 mg/dL — ABNORMAL HIGH (ref 70–99)
Glucose-Capillary: 227 mg/dL — ABNORMAL HIGH (ref 70–99)

## 2011-06-13 SURGERY — ERCP, WITH INTERVENTION IF INDICATED
Anesthesia: Monitor Anesthesia Care

## 2011-06-13 MED ORDER — LACTATED RINGERS IV SOLN
INTRAVENOUS | Status: DC
  Administered 2011-06-13: 10:00:00 via INTRAVENOUS

## 2011-06-13 MED ORDER — GLUCAGON HCL (RDNA) 1 MG IJ SOLR
INTRAMUSCULAR | Status: AC
  Filled 2011-06-13: qty 2

## 2011-06-13 MED ORDER — SODIUM CHLORIDE 0.9 % IV SOLN
1.5000 g | INTRAVENOUS | Status: AC
  Administered 2011-06-13: 1.5 g via INTRAVENOUS
  Filled 2011-06-13: qty 1.5

## 2011-06-13 MED ORDER — KETAMINE HCL 10 MG/ML IJ SOLN
INTRAMUSCULAR | Status: DC | PRN
  Administered 2011-06-13: 40 mg via INTRAVENOUS
  Administered 2011-06-13: 20 mg via INTRAVENOUS

## 2011-06-13 MED ORDER — SODIUM CHLORIDE 0.9 % IV SOLN
INTRAVENOUS | Status: AC
  Filled 2011-06-13: qty 1.5

## 2011-06-13 MED ORDER — BUTAMBEN-TETRACAINE-BENZOCAINE 2-2-14 % EX AERO
INHALATION_SPRAY | CUTANEOUS | Status: DC | PRN
  Administered 2011-06-13: 2 via TOPICAL

## 2011-06-13 MED ORDER — INSULIN ASPART 100 UNIT/ML ~~LOC~~ SOLN
0.0000 [IU] | SUBCUTANEOUS | Status: DC
  Administered 2011-06-13: 2 [IU] via SUBCUTANEOUS
  Filled 2011-06-13: qty 3

## 2011-06-13 MED ORDER — LIDOCAINE HCL (CARDIAC) 20 MG/ML IV SOLN
INTRAVENOUS | Status: DC | PRN
  Administered 2011-06-13: 100 mg via INTRAVENOUS

## 2011-06-13 MED ORDER — PROPOFOL 10 MG/ML IV EMUL
INTRAVENOUS | Status: DC | PRN
  Administered 2011-06-13: 140 ug/kg/min via INTRAVENOUS

## 2011-06-13 MED ORDER — SODIUM CHLORIDE 0.9 % IV SOLN
INTRAVENOUS | Status: DC | PRN
  Administered 2011-06-13: 14:00:00 via INTRAVENOUS

## 2011-06-13 MED ORDER — FENTANYL CITRATE 0.05 MG/ML IJ SOLN: 25.0000 ug | INTRAMUSCULAR | Status: DC | PRN

## 2011-06-13 MED ORDER — EPINEPHRINE HCL 0.1 MG/ML IJ SOLN
INTRAMUSCULAR | Status: AC
  Filled 2011-06-13: qty 10

## 2011-06-13 MED ORDER — ASPIRIN-DIPYRIDAMOLE ER 25-200 MG PO CP12: 1.0000 | ORAL_CAPSULE | Freq: Two times a day (BID) | ORAL | Status: DC

## 2011-06-13 MED ORDER — GLYCOPYRROLATE 0.2 MG/ML IJ SOLN
INTRAMUSCULAR | Status: DC | PRN
  Administered 2011-06-13: 0.2 mg via INTRAVENOUS

## 2011-06-13 MED ORDER — GLUCAGON HCL (RDNA) 1 MG IJ SOLR
INTRAMUSCULAR | Status: DC | PRN
  Administered 2011-06-13 (×2): .5 mg via INTRAVENOUS
  Administered 2011-06-13: 1 mg via INTRAVENOUS

## 2011-06-13 MED ORDER — SODIUM CHLORIDE 0.9 % IJ SOLN
INTRAMUSCULAR | Status: DC | PRN
  Administered 2011-06-13: 13:00:00

## 2011-06-13 MED ORDER — MIDAZOLAM HCL 5 MG/5ML IJ SOLN
INTRAMUSCULAR | Status: DC | PRN
  Administered 2011-06-13 (×2): 1 mg via INTRAVENOUS

## 2011-06-13 MED ORDER — FENTANYL CITRATE 0.05 MG/ML IJ SOLN
INTRAMUSCULAR | Status: DC | PRN
  Administered 2011-06-13 (×3): 25 ug via INTRAVENOUS
  Administered 2011-06-13 (×2): 50 ug via INTRAVENOUS
  Administered 2011-06-13: 25 ug via INTRAVENOUS

## 2011-06-13 NOTE — H&P (Signed)
Patient interval history reviewed.  Patient examined again.  There has been no change from documented H/P dated 05/29/11 (scanned into chart from our office) except as documented above.  Assessment: 1.  Elevated LFTs. 2.  Cystic duct stone. 3.  Bile duct stones.  Plan: 1.  Endoscopic retrograde pancreatography with anticipated biliary sphincterotomy and stone extraction.  Patient is aware of slight increase risk of post-sphincterotomy bleeding, as he has been off Aggrenox (off x 48 hours). 2.  Endoscopic Retrograde Cholangiopancreatography (ERCP) 3.  Risks (up to and including bleeding, infection, perforation, pancreatitis that can be complicated by infected necrosis and death), benefits (removal of stones, alleviating blockage, decreasing risk of cholangitis or choledocholithiasis-related pancreatitis), and alternatives (watchful waiting, percutaneous transhepatic cholangiography) of ERCP were explained to patient in detail and patient elects to proceed.

## 2011-06-13 NOTE — Discharge Instructions (Signed)
Endoscopic Retrograde Cholangiopancreatography (ERCP) °ERCP stands for endoscopic retrograde cholangiopancreatography. In this procedure, a thin, lighted tube (endoscope) is used. It is passed through the mouth and down the back of the throat into the upper part of the intestine, called the duodenum. A small, plastic tube (cannula) is then passed through the endoscope. It is directed into the bile duct or pancreatic duct. Dye is then injected through the tube. X-rays are taken to study the biliary and pancreatic passageways. This procedure is used to diagnosis many diseases of the pancreas, bile ducts, liver, and gallbladder. °LET YOUR CAREGIVER KNOW ABOUT:  °· Allergies to food or medicine.  °· Medicines taken, including vitamins, herbs, eyedrops, over-the-counter medicines, and creams.  °· Use of steroids (by mouth or creams).  °· Previous problems with anesthetics or numbing medicines.  °· History of bleeding problems or blood clots.  °· Previous surgery.  °· Other health problems, including diabetes and kidney problems.  °· Possibility of pregnancy, if this applies.  °· Any barium X-rays during the past week.  °BEFORE THE PROCEDURE  °· Do not eat or drink anything, including water, for at least 6 hours before the procedure.  °· Ask your caregiver whether you should stop taking certain medicines prior to your procedure.  °· Arrange for someone to drive you home. You will not be allowed to drive for several hours after the procedure.  °· Arrive at least 60 minutes before the procedure or as directed. This will give you time to check in and fill out any necessary paperwork.  °PROCEDURE  °· You will be given medicine through a vein (intravenously) to make you relaxed and sleepy.  °· You might have a breathing tube placed to give you medicine that makes you sleep (general anesthetic).  °· Your throat may be sprayed with medicine that numbs the area (local anesthetic).  °· You will lie on your left side. The endoscope  will be inserted through your mouth and into the duodenum. The tube will not interfere with your breathing. Gagging is prevented by the anesthesia.  °· While X-rays are being taken, you may be positioned on your stomach.  °During the ERCP, the person performing the procedure may identify a blockage or narrowed opening to the bile duct. A muscular portion of the main bile duct may be partially cut (sphincterotomy). A thin, plastic tube (stent) will be positioned inside your bile duct. This is to allow fluid secreted by the liver (bile) to flow more easily through the narrowed opening. °AFTER THE PROCEDURE  °· You will rest in bed until you are fully conscious.  °· When you first wake up, your throat may feel slightly sore.  °· You will not be allowed to eat or drink until numbness subsides.  °· Once you are able to drink, urinate, and sit on the edge of the bed without feeling sick to your stomach (nauseous) or dizzy, you may be allowed to go home.  °· Have a friend or family member with you for the first 24 hours after your procedure.  °SEEK MEDICAL CARE IF:  °· You have an oral temperature above 102° F (38.9° C).  °· You develop other signs of infection, including chills or feeling unwell.  °· You have abdominal pain.  °· You have questions or concerns.  °MAKE SURE YOU:  °· Understand these instructions.  °· Will watch your condition.  °· Will get help right away if you are not doing well or get worse.  °  Document Released: 10/17/2000 Document Revised: 10/04/2010 Document Reviewed: 05/10/2009 °[employer]® Patient Information ©2012 [employer]. ° °

## 2011-06-13 NOTE — Transfer of Care (Signed)
Immediate Anesthesia Transfer of Care Note  Patient: Justin Bartlett  Procedure(s) Performed: Procedure(s) (LRB): ENDOSCOPIC RETROGRADE CHOLANGIOPANCREATOGRAPHY (ERCP) (N/A) SPHINCTEROTOMY () BILIARY STENT PLACEMENT (N/A)  Patient Location: PACU  Anesthesia Type: MAC  Level of Consciousness: sedated  Airway & Oxygen Therapy: Patient Spontanous Breathing and Patient connected to nasal cannula oxygen  Post-op Assessment: Report given to PACU RN and Post -op Vital signs reviewed and stable  Post vital signs: Reviewed and stable  Complications: No apparent anesthesia complications

## 2011-06-13 NOTE — Op Note (Signed)
[facility] [address] [place], [place]  [address]  ERCP PROCEDURE REPORT  PATIENT:  Justin Bartlett, Justin Bartlett  MR#:  [id number] BIRTHDATE:  December 03, 1950  GENDER:  male  ENDOSCOPIST:  Willis Modena, MD ASSISTANT:  Rolanda Lundborg, Jimmey Ralph, RN, CGRN, Dwain Sarna, RN CGRN  PROCEDURE DATE:  06/13/2011 PROCEDURE:  ERCP with stent placement, ERCP with sphincterotomy ASA CLASS:  Class III  INDICATIONS:  elevated LFTs, choledocholithiasis (on MRCP)  MEDICATIONS:  MAC sedation, administered by CRNA, Unasyn 1.5 g iv, Cetacaine spray x 2  DESCRIPTION OF PROCEDURE:   After the risks benefits and alternatives of the procedure were thoroughly explained, informed consent was obtained.  The Pentax ERCP [id number] endoscope was introduced through the mouth and advanced to the second portion of the duodenum.  The examination was normal with no endoscopic findings.  The scope was then completely withdrawn from the patient and the procedure terminated.  <<PROCEDUREIMAGES>>  FINDINGS:  J-shaped stomach, requiring placement of patient on left side in order to intubate the duodenum.  Patulous ampulla, with scant biliary drainage, near a small periampullary diverticulum.  Guidewire preferentially went into pancreatic duct; after small amount of contrast injection to confirm location in the pancreatic duct, which showed normal pancreatogram, 5Fr 3cm double flanged (internal + external) pancreatic duct stent was placed with appropriate positioning confirmed endoscopically and fluoroscopically.  With assistance from my partner Dr. Vida Rigger, the bile duct was ultimately cannulated.  CBD   10mm with small mid-cbd filling defect as well as impacted distal bile duct stone of approximately 10 x 10 mm in size.  Generous biliary sphincterotomy was performed with blended current.  Despite multiple attempts with both balloon and basket cathethers, I was unable to remove the stone.  8.5Fr 5cm  biliary stent was placed to complete the procedure.  There was good bile flow through stent post-procedure.  ENDOSCOPIC IMPRESSION:    1.  Impacted distal bile duct stone, biliary sphincterotomy and biliary + pancreatic duct stent placement as above.  RECOMMENDATIONS:      1.  Watch for potential complications of procedure. 2.  Ursodiol x 4-6 weeks, with subsequent repeat ERCP (with availability of SpyGlass, if needed). 3.  Hold Aggrenox (and other NSAIDs) for another 5 days, post-sphincterotomy. 4.  Follow-up with [place] in 2-3 weeks.  ______________________________ Willis Modena  CC:  n. eSIGNEDWillis Modena at 06/13/2011 01:44 PM  Chauncey Fischer, [phone]

## 2011-06-13 NOTE — Anesthesia Preprocedure Evaluation (Signed)
Anesthesia Evaluation  Patient identified by MRN, date of birth, ID band Patient awake    Reviewed: Allergy & Precautions, H&P , NPO status , Patient's Chart, lab work & pertinent test results, reviewed documented beta blocker date and time   Airway Mallampati: II TM Distance: >3 FB Neck ROM: Full    Dental  (+) Upper Dentures and Partial Lower   Pulmonary neg pulmonary ROS,  breath sounds clear to auscultation        Cardiovascular hypertension, Pt. on medications + CAD Rhythm:Regular Rate:Normal  CAD, s/p CABG 2001 Currently asymptomatic, good exercise tolerance Cards office visit 2 months ago-stable CAD   Neuro/Psych negative neurological ROS  negative psych ROS   GI/Hepatic Neg liver ROS, Common duct stone   Endo/Other  Diabetes mellitus-, Type 2, Insulin DependentMorbid obesityFair glucose control  Renal/GU negative Renal ROS  negative genitourinary   Musculoskeletal negative musculoskeletal ROS (+)   Abdominal   Peds negative pediatric ROS (+)  Hematology negative hematology ROS (+)   Anesthesia Other Findings   Reproductive/Obstetrics negative OB ROS                           Anesthesia Physical Anesthesia Plan  ASA: III  Anesthesia Plan: MAC   Post-op Pain Management:    Induction: Intravenous  Airway Management Planned: Mask  Additional Equipment:   Intra-op Plan:   Post-operative Plan:   Informed Consent: I have reviewed the patients History and Physical, chart, labs and discussed the procedure including the risks, benefits and alternatives for the proposed anesthesia with the patient or authorized representative who has indicated his/her understanding and acceptance.   Dental advisory given  Plan Discussed with: CRNA and Surgeon  Anesthesia Plan Comments:         Anesthesia Quick Evaluation

## 2011-06-13 NOTE — Preoperative (Signed)
Beta Blockers   Reason not to administer Beta Blockers:Not Applicable 

## 2011-06-13 NOTE — Anesthesia Postprocedure Evaluation (Signed)
  Anesthesia Post-op Note  Patient: Justin Bartlett  Procedure(s) Performed: Procedure(s) (LRB): ENDOSCOPIC RETROGRADE CHOLANGIOPANCREATOGRAPHY (ERCP) (N/A) SPHINCTEROTOMY () BILIARY STENT PLACEMENT (N/A)  Patient Location: PACU  Anesthesia Type: General  Level of Consciousness: oriented and sedated  Airway and Oxygen Therapy: Patient Spontanous Breathing and Patient connected to nasal cannula oxygen  Post-op Pain: mild  Post-op Assessment: Post-op Vital signs reviewed, Patient's Cardiovascular Status Stable, Respiratory Function Stable and Patent Airway  Post-op Vital Signs: stable  Complications: No apparent anesthesia complications

## 2011-06-13 NOTE — OR Nursing (Signed)
Pt CBG is 148 pre-procedure for ERCP, Dr. Shireen Quan requested SSI.  Verified with Dr. Shireen Quan that pt is to receive 2 units of Novolog before ERCP, since pt states he "drops easily".  Stated to give pt 2 units Novolog as ordered.  Will continue to monitor pt and recheck CBG after procedure and as needed. Angelique Blonder, RN

## 2011-06-14 ENCOUNTER — Encounter (HOSPITAL_COMMUNITY): Payer: Self-pay | Admitting: Gastroenterology

## 2011-06-21 ENCOUNTER — Other Ambulatory Visit: Payer: Self-pay | Admitting: Gastroenterology

## 2011-06-21 ENCOUNTER — Ambulatory Visit
Admission: RE | Admit: 2011-06-21 | Discharge: 2011-06-21 | Disposition: A | Discharge status: Home / Self Care | Payer: BC Managed Care – PPO | Source: Ambulatory Visit | Attending: Gastroenterology | Admitting: Gastroenterology

## 2011-07-26 ENCOUNTER — Encounter (INDEPENDENT_AMBULATORY_CARE_PROVIDER_SITE_OTHER): Payer: Self-pay

## 2011-08-21 ENCOUNTER — Encounter (HOSPITAL_COMMUNITY): Payer: Self-pay

## 2011-08-21 ENCOUNTER — Encounter (HOSPITAL_COMMUNITY)
Admission: RE | Admit: 2011-08-21 | Discharge: 2011-08-21 | Disposition: A | Discharge status: Home / Self Care | Payer: BC Managed Care – PPO | Source: Ambulatory Visit | Attending: Gastroenterology | Admitting: Gastroenterology

## 2011-08-21 HISTORY — DX: Tinea unguium: B35.1

## 2011-08-22 ENCOUNTER — Ambulatory Visit (HOSPITAL_COMMUNITY): Payer: BC Managed Care – PPO | Admitting: *Deleted

## 2011-08-22 ENCOUNTER — Encounter (HOSPITAL_COMMUNITY): Payer: Self-pay | Admitting: *Deleted

## 2011-08-22 ENCOUNTER — Ambulatory Visit (HOSPITAL_COMMUNITY): Payer: BC Managed Care – PPO

## 2011-08-22 ENCOUNTER — Ambulatory Visit (HOSPITAL_COMMUNITY)
Admission: RE | Admit: 2011-08-22 | Discharge: 2011-08-22 | Disposition: A | Discharge status: Home Health | Payer: BC Managed Care – PPO | Source: Ambulatory Visit | Attending: Gastroenterology | Admitting: Gastroenterology

## 2011-08-22 ENCOUNTER — Encounter (HOSPITAL_COMMUNITY)
Admission: RE | Disposition: A | Discharge status: Home Health | Payer: Self-pay | Source: Ambulatory Visit | Attending: Gastroenterology

## 2011-08-22 DIAGNOSIS — I2510 Atherosclerotic heart disease of native coronary artery without angina pectoris: Secondary | ICD-10-CM | POA: Insufficient documentation

## 2011-08-22 DIAGNOSIS — K219 Gastro-esophageal reflux disease without esophagitis: Secondary | ICD-10-CM | POA: Insufficient documentation

## 2011-08-22 DIAGNOSIS — Z794 Long term (current) use of insulin: Secondary | ICD-10-CM | POA: Insufficient documentation

## 2011-08-22 DIAGNOSIS — I129 Hypertensive chronic kidney disease with stage 1 through stage 4 chronic kidney disease, or unspecified chronic kidney disease: Secondary | ICD-10-CM | POA: Insufficient documentation

## 2011-08-22 DIAGNOSIS — K8050 Calculus of bile duct without cholangitis or cholecystitis without obstruction: Secondary | ICD-10-CM | POA: Insufficient documentation

## 2011-08-22 DIAGNOSIS — E119 Type 2 diabetes mellitus without complications: Secondary | ICD-10-CM | POA: Insufficient documentation

## 2011-08-22 DIAGNOSIS — E7800 Pure hypercholesterolemia, unspecified: Secondary | ICD-10-CM | POA: Insufficient documentation

## 2011-08-22 DIAGNOSIS — Z79899 Other long term (current) drug therapy: Secondary | ICD-10-CM | POA: Insufficient documentation

## 2011-08-22 DIAGNOSIS — Z01812 Encounter for preprocedural laboratory examination: Secondary | ICD-10-CM | POA: Insufficient documentation

## 2011-08-22 DIAGNOSIS — N189 Chronic kidney disease, unspecified: Secondary | ICD-10-CM | POA: Insufficient documentation

## 2011-08-22 HISTORY — PX: ERCP: SHX5425

## 2011-08-22 LAB — GLUCOSE, CAPILLARY: Glucose-Capillary: 181 mg/dL — ABNORMAL HIGH (ref 70–99)

## 2011-08-22 SURGERY — ERCP, WITH INTERVENTION IF INDICATED
Anesthesia: General

## 2011-08-22 MED ORDER — ASPIRIN-DIPYRIDAMOLE ER 25-200 MG PO CP12: 1.0000 | ORAL_CAPSULE | Freq: Two times a day (BID) | ORAL | Status: DC

## 2011-08-22 MED ORDER — GLUCAGON HCL (RDNA) 1 MG IJ SOLR
INTRAMUSCULAR | Status: DC | PRN
  Administered 2011-08-22 (×2): .5 mg via INTRAVENOUS

## 2011-08-22 MED ORDER — GLUCAGON HCL (RDNA) 1 MG IJ SOLR
INTRAMUSCULAR | Status: AC
  Filled 2011-08-22: qty 1

## 2011-08-22 MED ORDER — NEOSTIGMINE METHYLSULFATE 1 MG/ML IJ SOLN
INTRAMUSCULAR | Status: DC | PRN
  Administered 2011-08-22: 5 mg via INTRAVENOUS

## 2011-08-22 MED ORDER — GLYCOPYRROLATE 0.2 MG/ML IJ SOLN
INTRAMUSCULAR | Status: DC | PRN
  Administered 2011-08-22: .8 mg via INTRAVENOUS

## 2011-08-22 MED ORDER — LACTATED RINGERS IV SOLN
INTRAVENOUS | Status: DC
Start: 2011-08-22 — End: 2011-08-22

## 2011-08-22 MED ORDER — LIDOCAINE HCL (CARDIAC) 20 MG/ML IV SOLN
INTRAVENOUS | Status: DC | PRN
  Administered 2011-08-22: 100 mg via INTRAVENOUS

## 2011-08-22 MED ORDER — ONDANSETRON HCL 4 MG/2ML IJ SOLN
INTRAMUSCULAR | Status: DC | PRN
  Administered 2011-08-22: 4 mg via INTRAVENOUS

## 2011-08-22 MED ORDER — FENTANYL CITRATE 0.05 MG/ML IJ SOLN: 25.0000 ug | INTRAMUSCULAR | Status: DC | PRN

## 2011-08-22 MED ORDER — EPHEDRINE SULFATE 50 MG/ML IJ SOLN
INTRAMUSCULAR | Status: DC | PRN
  Administered 2011-08-22: 5 mg via INTRAVENOUS

## 2011-08-22 MED ORDER — PROPOFOL 10 MG/ML IV BOLUS
INTRAVENOUS | Status: DC | PRN
  Administered 2011-08-22: 200 mg via INTRAVENOUS

## 2011-08-22 MED ORDER — FENTANYL CITRATE 0.05 MG/ML IJ SOLN
INTRAMUSCULAR | Status: DC | PRN
  Administered 2011-08-22: 50 ug via INTRAVENOUS
  Administered 2011-08-22: 25 ug via INTRAVENOUS
  Administered 2011-08-22: 50 ug via INTRAVENOUS

## 2011-08-22 MED ORDER — SODIUM CHLORIDE 0.9 % IV SOLN
1.5000 g | INTRAVENOUS | Status: AC
  Administered 2011-08-22: 1.5 g via INTRAVENOUS
  Filled 2011-08-22 (×2): qty 1.5

## 2011-08-22 MED ORDER — URSODIOL 300 MG PO CAPS: 300.0000 mg | ORAL_CAPSULE | Freq: Three times a day (TID) | ORAL | Status: DC

## 2011-08-22 MED ORDER — LACTATED RINGERS IV SOLN
INTRAVENOUS | Status: DC
  Administered 2011-08-22: 10:00:00 via INTRAVENOUS
  Administered 2011-08-22: 1000 mL via INTRAVENOUS

## 2011-08-22 MED ORDER — ROCURONIUM BROMIDE 100 MG/10ML IV SOLN
INTRAVENOUS | Status: DC | PRN
  Administered 2011-08-22: 20 mg via INTRAVENOUS

## 2011-08-22 MED ORDER — IOHEXOL 350 MG/ML SOLN
INTRAVENOUS | Status: DC | PRN
  Administered 2011-08-22: 10:00:00

## 2011-08-22 NOTE — Anesthesia Procedure Notes (Signed)
Procedure Name: Intubation Date/Time: 08/22/2011 8:53 AM Performed by: Randon Goldsmith CATHERINE PAYNE Pre-anesthesia Checklist: Patient identified, Emergency Drugs available, Suction available and Patient being monitored Patient Re-evaluated:Patient Re-evaluated prior to inductionOxygen Delivery Method: Circle system utilized Preoxygenation: Pre-oxygenation with 100% oxygen Intubation Type: IV induction Ventilation: Mask ventilation without difficulty Laryngoscope Size: Miller and 3 Grade View: Grade I Tube type: Oral Tube size: 7.0 mm Number of attempts: 1 Airway Equipment and Method: Stylet Placement Confirmation: ETT inserted through vocal cords under direct vision,  positive ETCO2,  CO2 detector and breath sounds checked- equal and bilateral Secured at: 22 cm Tube secured with: Tape Dental Injury: Teeth and Oropharynx as per pre-operative assessment  Comments: Intubated by Gerlene Fee

## 2011-08-22 NOTE — Op Note (Signed)
[facility] [address] [place], [place]  [address]  ERCP PROCEDURE REPORT  PATIENT:  Justin Bartlett, Justin Bartlett  MR#:  [id number] BIRTHDATE:  May 04, 1950  GENDER:  male  ENDOSCOPIST:  Willis Modena, MD ASSISTANT:  Angelique Blonder, Jola Schmidt, RN REFERRING:  Gaynelle Adu, MD (CCS); Blair Heys  PROCEDURE DATE:  08/22/2011 PROCEDURE:  ERCP with stent change, ERCP with sphincterotomy, ERCP with removal of stones ASA CLASS:  Class III  INDICATIONS:  cbd stone  MEDICATIONS:  General endotracheal anesthesia; Unasyn 1.5 g IV  DESCRIPTION OF PROCEDURE:   After the risks benefits and alternatives of the procedure were thoroughly explained, informed consent was obtained.  The Pentax ERCP [id number] endoscope was introduced through the mouth and advanced to the second portion of the duodenum.  <<PROCEDUREIMAGES>>  FINDINGS:  Some retained food in stomach.  Again J-shaped stomach seen, requiring placement of patient in left lateral position to facilitate traversing the pylorus.  Previously placed pancreatic stent had spontaneously passed.  Biliary stent remained and was removed with snare.  Small periampullary diverticulum. Cholangiogram obtained.  Bile duct about 10 mm in maximal diameter, tapering within intrapancreatic portion.  Distal filling defect, approximately 8mm in diameter   The patient's bilary sphincterotomy was extended.  Despite multiple attempts, employing multiple baskets and balloon catheters, I was unable to remove the ston.  I could grasp the stone with the lithotripsy basket, but could not get it in good enough position for effective mechanical lithotripsy. Few stone fragments were extracted, but the majority of the patient's stone remains.  8.5 Fr 5 cm plastic biliary stent was placed to complete the procdure.  ENDOSCOPIC IMPRESSION:    1.  Persistently impacted distal bile duct stone.  Partially extracted as above.  CBD stent  replaced.  RECOMMENDATIONS:      1.  Watch for potential complications of procedure. 2.  Hold aggrenox 5 days post-procedure. 3.  Restart Ursodiol, increase to 300 mg tid (was on 300 mg bid previously). 4.  Repeat ERCP in 8 weeks, this time with use of cholangioscopy with electrohydraulic lithotripsy. 5.  Follow-up in office in 2-3 weeks.  If still unsuccessful despite trial of EHL, would need to consider surgical bile duct exploration.  ______________________________ Willis Modena  CC:  n. eSIGNEDWillis Modena at 08/22/2011 10:42 AM  Chauncey Fischer, [phone]

## 2011-08-22 NOTE — Preoperative (Signed)
Beta Blockers   Reason not to administer Beta Blockers:Not Applicable, took home BB this am 

## 2011-08-22 NOTE — H&P (Signed)
[facility] Admission Note  Chief Complaint: bile duct stone  HPI: Justin Bartlett is an 61 y.o. male presenting for ERCP with history bile duct stone.  Has been on ursodiol for about two months after his last ERCP in May.  He has no abdominal pain.    Past Medical History  Diagnosis Date  . Hypertension   . Diabetes mellitus     insulin dependent  . Coronary artery disease   . Gallstones   . Hypercholesterolemia   . Chronic kidney disease   . Stroke 2006  . History of stomach ulcers     upon EGD with Dr. Randa Evens  . GERD (gastroesophageal reflux disease)   . Toenail fungus     Past Surgical History  Procedure Date  . Cardiac catheterization 02/09/1999    LARGE AREA OF ANTERIOR AND APICAL  HYPOKINESIS PRESENT. EF 35%  . Cardiovascular stress test 06/2008    EF 41%  . Ankle surgery 1982    stainless steel rod and screws, LT ankle  . Coronary artery bypass graft 2001  . Ercp 06/13/2011    Procedure: ENDOSCOPIC RETROGRADE CHOLANGIOPANCREATOGRAPHY (ERCP);  Surgeon: Willis Modena, MD;  Location: [facility];  Service: Endoscopy;  Laterality: N/A;  Joni Reining requested to move pt due to another pt cancel  . Sphincterotomy 06/13/2011    Procedure: SPHINCTEROTOMY;  Surgeon: Willis Modena, MD;  Location: [facility];  Service: Endoscopy;;  . Biliary stent placement 06/13/2011    Procedure: BILIARY STENT PLACEMENT;  Surgeon: Willis Modena, MD;  Location: [facility];  Service: Endoscopy;  Laterality: N/A;    Medications Prior to Admission  Medication Sig Dispense Refill  . diltiazem (CARDIZEM CD) 240 MG 24 hr capsule Take 240 mg by mouth 2 (two) times daily.       Marland Kitchen dipyridamole-aspirin (AGGRENOX) 25-200 MG per 12 hr capsule Take 1 capsule by mouth 2 (two) times daily.      Marland Kitchen ezetimibe-simvastatin (VYTORIN) 10-80 MG per tablet Take 1 tablet by mouth at bedtime.        . insulin regular (HUMULIN R,NOVOLIN R) 100 units/mL injection Inject into the skin 3 (three) times daily before  meals. 14units tid Dr. Jamison Oka       . Insulin Syringe-Needle U-100 (INSULIN SYRINGE .3CC/29GX1/2") 29G X 1/2" 0.3 ML MISC Inject as directed as directed.      . itraconazole (SPORANOX) 10 MG/ML solution 200 mg daily. As directed      . metoprolol (LOPRESSOR) 100 MG tablet Take 100 mg by mouth 2 (two) times daily.       Marland Kitchen omeprazole (PRILOSEC) 10 MG capsule Take 10 mg by mouth 2 (two) times daily.      . ramipril (ALTACE) 10 MG tablet Take 10 mg by mouth daily.        . ursodiol (ACTIGALL) 300 MG capsule Take 300 mg by mouth 2 (two) times daily.        Allergies:  Allergies  Allergen Reactions  . Actos (Pioglitazone)     Elevated kidney function  . Hctz (Hydrochlorothiazide)     Family History  Problem Relation Age of Onset  . Colon cancer Mother   . Cancer Mother     colon  . Heart attack Father   . Hypertension Father   . Hypertension Sister   . Diabetes Sister     Social History:  reports that he has never smoked. He does not have any smokeless tobacco history on file. He reports that he does not  drink alcohol or use illicit drugs.     Blood pressure 140/78, temperature 98.5 F (36.9 C), temperature source Oral, resp. rate 21, height 5\' 11"  (1.803 m), weight 116.121 kg (256 lb), SpO2 96.00%. GEN:  NAD, overweight ABD:  Protuberant, soft  Results for orders placed during the hospital encounter of 08/22/11 (from the past 48 hour(s))  GLUCOSE, CAPILLARY     Status: Abnormal   Collection Time   08/22/11  7:44 AM      Component Value Range Comment   Glucose-Capillary 181 (*) 70 - 99 mg/dL    No results found.  Assessment:  Bile duct stone.  Biliary stent in place, passage of pancreatic stent, per xray soon after his last procedure.  Plan:  1.  ERCP with stent removal and attempted stone extraction. 2.  Risks (up to and including bleeding, infection, perforation, pancreatitis that can be complicated by infected necrosis and death), benefits (removal of stones,  alleviating blockage, decreasing risk of cholangitis or choledocholithiasis-related pancreatitis), and alternatives (watchful waiting, percutaneous transhepatic cholangiography) of ERCP were explained to patient in detail and he elects to proceed.  Freddy Jaksch 08/22/2011, 8:34 AM

## 2011-08-22 NOTE — Anesthesia Postprocedure Evaluation (Signed)
  Anesthesia Post-op Note  Patient: Justin Bartlett  Procedure(s) Performed: Procedure(s) (LRB): ENDOSCOPIC RETROGRADE CHOLANGIOPANCREATOGRAPHY (ERCP) (N/A)  Patient Location: PACU  Anesthesia Type: General  Level of Consciousness: awake and alert   Airway and Oxygen Therapy: Patient Spontanous Breathing  Post-op Pain: mild  Post-op Assessment: Post-op Vital signs reviewed, Patient's Cardiovascular Status Stable, Respiratory Function Stable, Patent Airway and No signs of Nausea or vomiting  Post-op Vital Signs: stable  Complications: No apparent anesthesia complications

## 2011-08-22 NOTE — Transfer of Care (Signed)
Immediate Anesthesia Transfer of Care Note  Patient: Justin Bartlett  Procedure(s) Performed: Procedure(s) (LRB): ENDOSCOPIC RETROGRADE CHOLANGIOPANCREATOGRAPHY (ERCP) (N/A)  Patient Location: Endoscopy Unit  Anesthesia Type: General  Level of Consciousness: awake, alert  and oriented  Airway & Oxygen Therapy: Patient Spontanous Breathing and Patient connected to face mask oxygen  Post-op Assessment: Report given to PACU RN and Post -op Vital signs reviewed and stable  Post vital signs: Reviewed and stable  Complications: No apparent anesthesia complications

## 2011-08-22 NOTE — Anesthesia Preprocedure Evaluation (Addendum)
Anesthesia Evaluation  Patient identified by MRN, date of birth, ID band Patient awake    Reviewed: Allergy & Precautions, H&P , NPO status , Patient's Chart, lab work & pertinent test results, reviewed documented beta blocker date and time   Airway Mallampati: III TM Distance: >3 FB Neck ROM: full    Dental  (+) Edentulous Upper and Edentulous Lower   Pulmonary neg pulmonary ROS,  breath sounds clear to auscultation  Pulmonary exam normal       Cardiovascular Exercise Tolerance: Good hypertension, Pt. on home beta blockers and Pt. on medications + CAD negative cardio ROS  Rhythm:regular Rate:Normal     Neuro/Psych negative neurological ROS  negative psych ROS   GI/Hepatic negative GI ROS, Neg liver ROS,   Endo/Other  negative endocrine ROSWell Controlled, Type 2, Insulin Dependent  Renal/GU negative Renal ROS  negative genitourinary   Musculoskeletal   Abdominal   Peds  Hematology negative hematology ROS (+)   Anesthesia Other Findings   Reproductive/Obstetrics negative OB ROS                          Anesthesia Physical Anesthesia Plan  ASA: III  Anesthesia Plan: General   Post-op Pain Management:    Induction: Intravenous  Airway Management Planned: Oral ETT  Additional Equipment:   Intra-op Plan:   Post-operative Plan: Extubation in OR  Informed Consent: I have reviewed the patients History and Physical, chart, labs and discussed the procedure including the risks, benefits and alternatives for the proposed anesthesia with the patient or authorized representative who has indicated his/her understanding and acceptance.   Dental Advisory Given  Plan Discussed with: CRNA and Surgeon  Anesthesia Plan Comments:         Anesthesia Quick Evaluation

## 2011-08-23 LAB — POCT I-STAT EG7
O2 Saturation: 82 %
TCO2: 23 mmol/L (ref 0–100)
pCO2, Ven: 41.3 mmHg — ABNORMAL LOW (ref 45.0–50.0)
pO2, Ven: 49 mmHg — ABNORMAL HIGH (ref 30.0–45.0)

## 2011-08-27 ENCOUNTER — Encounter (HOSPITAL_COMMUNITY): Payer: Self-pay | Admitting: Gastroenterology

## 2011-09-14 ENCOUNTER — Encounter: Payer: Self-pay | Admitting: Cardiology

## 2011-09-14 ENCOUNTER — Ambulatory Visit (INDEPENDENT_AMBULATORY_CARE_PROVIDER_SITE_OTHER): Payer: BC Managed Care – PPO | Admitting: Cardiology

## 2011-09-14 VITALS — BP 140/70 | HR 60 | Ht 72.0 in | Wt 267.0 lb

## 2011-09-14 DIAGNOSIS — I259 Chronic ischemic heart disease, unspecified: Secondary | ICD-10-CM

## 2011-09-14 DIAGNOSIS — K8050 Calculus of bile duct without cholangitis or cholecystitis without obstruction: Secondary | ICD-10-CM

## 2011-09-14 NOTE — Assessment & Plan Note (Signed)
The patient is status post cholecystectomy.  He does have: No code lithiasis and Dr. Randa Evens and Dr. Dulce Sellar have been working with him about that.  He now has a stent in his common bile duct and is on ursodiol 3 times a day

## 2011-09-14 NOTE — Assessment & Plan Note (Signed)
The patient has not had any symptoms of recurrent angina or chest pain.  He exercises several times a week on a treadmill in his garage.

## 2011-09-14 NOTE — Patient Instructions (Addendum)
Your physician recommends that you continue on your current medications as directed. Please refer to the Current Medication list given to you today.  Your physician wants you to follow-up in: 6 months. You will receive a reminder letter in the mail two months in advance. If you don't receive a letter, please call our office to schedule the follow-up appointment.  

## 2011-09-14 NOTE — Assessment & Plan Note (Signed)
His diabetes is followed by Dr. Sharl Ma.  The patient reports that his most recent A1c was down to 6.1.

## 2011-09-14 NOTE — Progress Notes (Signed)
Justin Bartlett Date of Birth:  1950/12/26 [facility] [address] [place], [place]  [address] [phone]         Fax   [phone]  History of Present Illness: This pleasant 61 year old gentleman is seen for a six-month followup office visit.  He has a history of known ischemic heart disease.  He had an anteroseptal myocardial infarction in 2001 and underwent coronary artery bypass graft surgery at that time.  His last nuclear stress test 06/23/08 showed an old apical infarct with minimal reversible ischemia his ejection fraction was 41%.  The patient has a history of hypertension, diabetes, and exogenous obesity.  He is also followed by Dr. Luciana Axe for retinal disease. Current Outpatient Prescriptions  Medication Sig Dispense Refill  . diltiazem (CARDIZEM CD) 240 MG 24 hr capsule Take 240 mg by mouth 2 (two) times daily.       Marland Kitchen dipyridamole-aspirin (AGGRENOX) 200-25 MG per 12 hr capsule Take 1 capsule by mouth 2 (two) times daily.      Marland Kitchen ezetimibe-simvastatin (VYTORIN) 10-80 MG per tablet Take 1 tablet by mouth at bedtime.        . insulin regular (HUMULIN R,NOVOLIN R) 100 units/mL injection Inject into the skin 3 (three) times daily before meals. 14units tid Dr. Jamison Oka       . Insulin Syringe-Needle U-100 (INSULIN SYRINGE .3CC/29GX1/2") 29G X 1/2" 0.3 ML MISC Inject as directed as directed.      . itraconazole (SPORANOX) 10 MG/ML solution 200 mg daily. As directed      . metoprolol (LOPRESSOR) 100 MG tablet Take 100 mg by mouth 2 (two) times daily.       Marland Kitchen omeprazole (PRILOSEC) 10 MG capsule Take 10 mg by mouth 2 (two) times daily.      . ramipril (ALTACE) 10 MG tablet Take 10 mg by mouth daily.        . ursodiol (ACTIGALL) 300 MG capsule Take 300 mg by mouth 3 (three) times daily.       Marland Kitchen ACCU-CHEK AVIVA PLUS test strip as directed.        Allergies  Allergen Reactions  . Actos (Pioglitazone)     Elevated kidney function  . Hctz (Hydrochlorothiazide)      Patient Active Problem List  Diagnosis  . Ischemic heart disease  . Benign hypertensive heart disease without heart failure  . Diabetes mellitus  . Exogenous obesity  . Hypercholesterolemia  . Elevated liver function tests  . Choledocholithiasis    History  Smoking status  . Never Smoker   Smokeless tobacco  . Not on file    History  Alcohol Use No    Family History  Problem Relation Age of Onset  . Colon cancer Mother   . Cancer Mother     colon  . Heart attack Father   . Hypertension Father   . Hypertension Sister   . Diabetes Sister     Review of Systems: Constitutional: no fever chills diaphoresis or fatigue or change in weight.  Head and neck: no hearing loss, no epistaxis, no photophobia or visual disturbance. Respiratory: No cough, shortness of breath or wheezing. Cardiovascular: No chest pain peripheral edema, palpitations. Gastrointestinal: No abdominal distention, no abdominal pain, no change in bowel habits hematochezia or melena. Genitourinary: No dysuria, no frequency, no urgency, no nocturia. Musculoskeletal:No arthralgias, no back pain, no gait disturbance or myalgias. Neurological: No dizziness, no headaches, no numbness, no seizures, no syncope, no weakness, no tremors. Hematologic: No  lymphadenopathy, no easy bruising. Psychiatric: No confusion, no hallucinations, no sleep disturbance.    Physical Exam: Filed Vitals:   09/14/11 1054  BP: 140/70  Pulse: 60   the general appearance reveals a well-developed well-nourished gentleman in no distress.  Since we last saw him his weight has dropped 16 pounds intentionally.Pupils equal and reactive.   Extraocular Movements are full.  There is no scleral icterus.  The mouth and pharynx are normal.  The neck is supple.  The carotids reveal no bruits.  The jugular venous pressure is normal.  The thyroid is not enlarged.  There is no lymphadenopathy.  The chest is clear to percussion and auscultation.  There are no rales or rhonchi. Expansion of the chest is symmetrical.  The precordium is quiet.  The first heart sound is normal.  The second heart sound is physiologically split.  There is no murmur gallop rub or click.  There is no abnormal lift or heave.  The abdomen is soft and nontender. Bowel sounds are normal. The liver and spleen are not enlarged. There Are no abdominal masses. There are no bruits.  Extremities reveal 1+ pretibial and ankle edema  EKG today shows sinus rhythm with first degree heart block and a pattern of a prior anteroseptal myocardial infarction with secondary ST-T wave changes.  There has been no change in his electrocardiogram since the previous tracing of 08/07/10.  Assessment / Plan: Continue same medication.  Continue regular exercise and careful diet.  Recheck 6 months for followup office visit.  The patient is still working at [employer]  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

## 2011-10-24 ENCOUNTER — Ambulatory Visit (HOSPITAL_COMMUNITY): Payer: BC Managed Care – PPO | Admitting: Anesthesiology

## 2011-10-24 ENCOUNTER — Encounter (HOSPITAL_COMMUNITY)
Admission: RE | Disposition: A | Discharge status: Home / Self Care | Payer: Self-pay | Source: Ambulatory Visit | Attending: Gastroenterology

## 2011-10-24 ENCOUNTER — Ambulatory Visit (HOSPITAL_COMMUNITY)
Admission: RE | Admit: 2011-10-24 | Discharge: 2011-10-24 | Disposition: A | Discharge status: Home / Self Care | Payer: BC Managed Care – PPO | Source: Ambulatory Visit | Attending: Gastroenterology | Admitting: Gastroenterology

## 2011-10-24 ENCOUNTER — Encounter (HOSPITAL_COMMUNITY): Payer: Self-pay | Admitting: Anesthesiology

## 2011-10-24 ENCOUNTER — Ambulatory Visit (HOSPITAL_COMMUNITY): Payer: BC Managed Care – PPO

## 2011-10-24 DIAGNOSIS — K8050 Calculus of bile duct without cholangitis or cholecystitis without obstruction: Secondary | ICD-10-CM | POA: Insufficient documentation

## 2011-10-24 HISTORY — PX: SPYGLASS LITHOTRIPSY: SHX5537

## 2011-10-24 HISTORY — PX: ERCP: SHX5425

## 2011-10-24 LAB — GLUCOSE, CAPILLARY: Glucose-Capillary: 133 mg/dL — ABNORMAL HIGH (ref 70–99)

## 2011-10-24 SURGERY — ERCP, WITH INTERVENTION IF INDICATED
Anesthesia: General

## 2011-10-24 MED ORDER — CIPROFLOXACIN IN D5W 400 MG/200ML IV SOLN
400.0000 mg | Freq: Once | INTRAVENOUS | Status: AC
  Administered 2011-10-24: 400 mg via INTRAVENOUS

## 2011-10-24 MED ORDER — LIDOCAINE HCL (CARDIAC) 20 MG/ML IV SOLN
INTRAVENOUS | Status: DC | PRN
  Administered 2011-10-24: 75 mg via INTRAVENOUS

## 2011-10-24 MED ORDER — PROPOFOL 10 MG/ML IV EMUL
INTRAVENOUS | Status: DC | PRN
  Administered 2011-10-24: 200 mg via INTRAVENOUS

## 2011-10-24 MED ORDER — GLUCAGON HCL (RDNA) 1 MG IJ SOLR
INTRAMUSCULAR | Status: DC | PRN
  Administered 2011-10-24: .5 mg via INTRAVENOUS

## 2011-10-24 MED ORDER — GLYCOPYRROLATE 0.2 MG/ML IJ SOLN
INTRAMUSCULAR | Status: DC | PRN
  Administered 2011-10-24: .6 mg via INTRAVENOUS

## 2011-10-24 MED ORDER — NEOSTIGMINE METHYLSULFATE 1 MG/ML IJ SOLN
INTRAMUSCULAR | Status: DC | PRN
  Administered 2011-10-24: 2 mg via INTRAVENOUS

## 2011-10-24 MED ORDER — CIPROFLOXACIN IN D5W 400 MG/200ML IV SOLN
INTRAVENOUS | Status: AC
  Filled 2011-10-24: qty 200

## 2011-10-24 MED ORDER — ASPIRIN-DIPYRIDAMOLE ER 25-200 MG PO CP12: 1.0000 | ORAL_CAPSULE | Freq: Two times a day (BID) | ORAL | Status: AC

## 2011-10-24 MED ORDER — CISATRACURIUM BESYLATE (PF) 10 MG/5ML IV SOLN
INTRAVENOUS | Status: DC | PRN
  Administered 2011-10-24: 2 mg via INTRAVENOUS
  Administered 2011-10-24: 6 mg via INTRAVENOUS

## 2011-10-24 MED ORDER — MIDAZOLAM HCL 5 MG/5ML IJ SOLN
INTRAMUSCULAR | Status: DC | PRN
  Administered 2011-10-24: .5 mg via INTRAVENOUS
  Administered 2011-10-24: 1 mg via INTRAVENOUS

## 2011-10-24 MED ORDER — SUCCINYLCHOLINE CHLORIDE 20 MG/ML IJ SOLN
INTRAMUSCULAR | Status: DC | PRN
  Administered 2011-10-24: 100 mg via INTRAVENOUS

## 2011-10-24 MED ORDER — MEPERIDINE HCL 100 MG/ML IJ SOLN: 6.2500 mg | INTRAMUSCULAR | Status: DC | PRN

## 2011-10-24 MED ORDER — GLUCAGON HCL (RDNA) 1 MG IJ SOLR
INTRAMUSCULAR | Status: AC
  Filled 2011-10-24: qty 2

## 2011-10-24 MED ORDER — LACTATED RINGERS IV SOLN
INTRAVENOUS | Status: DC
  Administered 2011-10-24: 1000 mL via INTRAVENOUS

## 2011-10-24 MED ORDER — SODIUM CHLORIDE 0.9 % IV SOLN
INTRAVENOUS | Status: DC | PRN
  Administered 2011-10-24: 13:00:00

## 2011-10-24 MED ORDER — PROMETHAZINE HCL 25 MG/ML IJ SOLN: 6.2500 mg | INTRAMUSCULAR | Status: DC | PRN

## 2011-10-24 MED ORDER — FENTANYL CITRATE 0.05 MG/ML IJ SOLN
INTRAMUSCULAR | Status: DC | PRN
  Administered 2011-10-24: 50 ug via INTRAVENOUS

## 2011-10-24 MED ORDER — LACTATED RINGERS IV SOLN
INTRAVENOUS | Status: DC | PRN
  Administered 2011-10-24 (×2): via INTRAVENOUS

## 2011-10-24 MED ORDER — SODIUM CHLORIDE 0.9 % IV SOLN: INTRAVENOUS | Status: DC

## 2011-10-24 NOTE — Anesthesia Preprocedure Evaluation (Addendum)
Anesthesia Evaluation  Patient identified by MRN, date of birth, ID band Patient awake    Reviewed: Allergy & Precautions, H&P , NPO status , Patient's Chart, lab work & pertinent test results, reviewed documented beta blocker date and time   Airway Mallampati: III TM Distance: >3 FB Neck ROM: full    Dental  (+) Edentulous Upper and Edentulous Lower   Pulmonary neg pulmonary ROS,  breath sounds clear to auscultation  Pulmonary exam normal       Cardiovascular Exercise Tolerance: Good hypertension, Pt. on home beta blockers and Pt. on medications + CAD and + CABG negative cardio ROS  Rhythm:regular Rate:Normal     Neuro/Psych CVA negative neurological ROS  negative psych ROS   GI/Hepatic negative GI ROS, Neg liver ROS,   Endo/Other  negative endocrine ROSdiabetes, Well Controlled, Type 2, Insulin Dependent  Renal/GU negative Renal ROS  negative genitourinary   Musculoskeletal   Abdominal   Peds  Hematology negative hematology ROS (+)   Anesthesia Other Findings   Reproductive/Obstetrics negative OB ROS                          Anesthesia Physical  Anesthesia Plan  ASA: III  Anesthesia Plan: General   Post-op Pain Management:    Induction: Intravenous  Airway Management Planned: Oral ETT  Additional Equipment:   Intra-op Plan:   Post-operative Plan: Extubation in OR  Informed Consent: I have reviewed the patients History and Physical, chart, labs and discussed the procedure including the risks, benefits and alternatives for the proposed anesthesia with the patient or authorized representative who has indicated his/her understanding and acceptance.   Dental Advisory Given  Plan Discussed with: CRNA and Surgeon  Anesthesia Plan Comments:         Anesthesia Quick Evaluation

## 2011-10-24 NOTE — Transfer of Care (Signed)
Immediate Anesthesia Transfer of Care Note  Patient: Justin Bartlett  Procedure(s) Performed: Procedure(s) (LRB) with comments: ENDOSCOPIC RETROGRADE CHOLANGIOPANCREATOGRAPHY (ERCP) (N/A) - Need Mccall Pera to help with case, Need to order Lithotripter,  10/03/11 lithotripter ordered by Ever[facility] office notified. , SPYGLASS LITHOTRIPSY (N/A)  Patient Location: PACU and Endoscopy Unit  Anesthesia Type: General  Level of Consciousness: awake, alert , oriented and patient cooperative  Airway & Oxygen Therapy: Patient Spontanous Breathing and Patient connected to face mask oxygen  Post-op Assessment: Report given to PACU RN, Post -op Vital signs reviewed and stable and Patient moving all extremities  Post vital signs: Reviewed and stable  Complications: No apparent anesthesia complications

## 2011-10-24 NOTE — H&P (Signed)
Patient interval history reviewed.  Patient examined again.  There has been no change from documented H/P dated 09/26/11 (scanned into chart from our office) except as documented above.  Assessment:  1.  Common bile duct stone.  Plan:  1.  ERCP with stent removal and possible bile duct stone removal.  Cholangioscopy with electrohydraulic lithotripsy available. 2.  Risks (up to and including bleeding, infection, perforation, pancreatitis that can be complicated by infected necrosis and death), benefits (removal of stones, alleviating blockage, decreasing risk of cholangitis or choledocholithiasis-related pancreatitis), and alternatives (watchful waiting, percutaneous transhepatic cholangiography) of ERCP were explained to patient in detail and patient elects to proceed.

## 2011-10-24 NOTE — Op Note (Signed)
[facility] [address] [place], [address]   ERCP PROCEDURE REPORT  PATIENT: Justin Bartlett, Justin Bartlett.  MR# :[id number] BIRTHDATE: 1951/01/11  GENDER: Male ENDOSCOPIST: Willis Modena, MD REFERRED BY: Blair Heys, M.D.  Gaynelle Adu, M.D. PROCEDURE DATE:  10/24/2011 PROCEDURE:   ERCP with removal of calculus/calculi , ERCP with biliary stent removal, and Endoscopic catheterization of biliary duct (cholangioscopy) ASA CLASS:    ASA-III INDICATIONS:  Common bile duct stone MEDICATIONS:    General endotracheal anesthesia (GETA), Cipro 400 mg IV  DESCRIPTION OF PROCEDURE:   After the risks benefits and alternatives of the procedure were thoroughly explained, informed consent was obtained.  The Pentax ERCP [id number]  endoscope was introduced through the mouth and advanced to the second portion of the duodenum .     FINDINGS:  J-shaped stomach, requiring placing patient temporarily in left lateral position to intubate the duodenum.  The ampulla was seen, and previously placed stent was removed with snare.   Deep biliary access obtained; distal filling defect seen, and was ultimately able to be removed with lithotripsy basket.  Subsequent trawls with balloon and basket catheters yielded no further stones. The bile duct was likewise during visualized with Spyglass cholangioscopy.  There was some erythema and ulceration, likely from device trauma.  However, no obvious residual bile duct stones were visualized.   Good bile flow per ampulla pre- and post-procedure.  ENDOSCOPIC IMPRESSION:  As above.  Successful removal of bile duct stone.  RECOMMENDATIONS:  1.  Watch for potential complications of procedure. 2.  Follow clinical response to stone extraction. 3.  OK to stop ursodiol. 4.  Resume aggrenox in 3 days. 5.  Follow-up with [place] in office in 4-6 weeks.  Will see how he does before reconsideration of  cholecystectomy.     _______________________________ Rosalie DoctorWillis Modena, MD 10/24/2011 12:48 PM   CC:

## 2011-10-24 NOTE — Preoperative (Signed)
Beta Blockers   Reason not to administer Beta Blockers:Not Applicable pt. Took beta blocker this a.m. 

## 2011-10-25 ENCOUNTER — Encounter (HOSPITAL_COMMUNITY): Payer: Self-pay | Admitting: Gastroenterology

## 2011-10-25 NOTE — Anesthesia Postprocedure Evaluation (Signed)
  Anesthesia Post-op Note  Patient: Justin Bartlett  Procedure(s) Performed: Procedure(s) (LRB): ENDOSCOPIC RETROGRADE CHOLANGIOPANCREATOGRAPHY (ERCP) (N/A) SPYGLASS LITHOTRIPSY (N/A)  Patient Location: PACU  Anesthesia Type: General  Level of Consciousness: awake and alert   Airway and Oxygen Therapy: Patient Spontanous Breathing  Post-op Pain: mild  Post-op Assessment: Post-op Vital signs reviewed, Patient's Cardiovascular Status Stable, Respiratory Function Stable, Patent Airway and No signs of Nausea or vomiting  Post-op Vital Signs: stable  Complications: No apparent anesthesia complications

## 2011-12-27 ENCOUNTER — Encounter (INDEPENDENT_AMBULATORY_CARE_PROVIDER_SITE_OTHER): Payer: Self-pay | Admitting: General Surgery

## 2011-12-27 ENCOUNTER — Ambulatory Visit (INDEPENDENT_AMBULATORY_CARE_PROVIDER_SITE_OTHER): Payer: BC Managed Care – PPO | Admitting: General Surgery

## 2011-12-27 VITALS — BP 140/86 | HR 76 | Temp 97.4°F | Resp 14 | Ht 70.0 in | Wt 273.6 lb

## 2011-12-27 DIAGNOSIS — K8020 Calculus of gallbladder without cholecystitis without obstruction: Secondary | ICD-10-CM

## 2011-12-27 NOTE — Patient Instructions (Signed)
Stop Aggrenox 5 days prior to gallbladder surgery

## 2011-12-27 NOTE — Progress Notes (Signed)
Patient ID: Justin Bartlett, male   DOB: 05/24/1950, 61 y.o.   MRN: [id number]  Chief Complaint  Patient presents with  . Abdominal Pain    HPI Justin Bartlett is a 61 y.o. male.   HPI 61-year-old Caucasian male comes back in to discuss gallbladder surgery. I initially saw him back in March of this year for elevated liver function test as well as a gallstone seen on his abdominal ultrasound. At that time he had absolutely no symptoms. I referred him to gastroenterology. He ultimately underwent an MRCP which showed a distal common bile duct stone with common bile duct dilatation as well as a 1 cm cystic duct stone and a distended gallbladder without any signs of inflammation. He underwent multiple ERCPs by Dr. Outlaw throughout the spring and summer. He had an impacted distal common bile duct stone that was unable to be extracted during the first 2 ERCPs. He underwent sphincterotomies as well as biliary and pancreatic duct stent placement. He was also placed on ursodiol during this time. On September 18 he underwent a repeat ERCP in the distal common bile duct stone was able to be extracted. Since that time he states that he has done well. He denies any abdominal pain, bloating, nausea, vomiting, indigestion, diarrhea, or constipation. He denies any acholic stools. He denies any icterus or jaundice. He denies any itching. Past Medical History  Diagnosis Date  . Hypertension   . Diabetes mellitus     insulin dependent  . Coronary artery disease   . Gallstones   . Hypercholesterolemia   . Chronic kidney disease   . Stroke 2006  . History of stomach ulcers     upon EGD with Dr. Edwards  . GERD (gastroesophageal reflux disease)   . Toenail fungus     Past Surgical History  Procedure Date  . Cardiac catheterization 02/09/1999    LARGE AREA OF ANTERIOR AND APICAL  HYPOKINESIS PRESENT. EF 35%  . Cardiovascular stress test 06/2008    EF 41%  . Ankle surgery 1982    stainless steel rod and screws, LT  ankle  . Coronary artery bypass graft 2001  . Ercp 06/13/2011    Procedure: ENDOSCOPIC RETROGRADE CHOLANGIOPANCREATOGRAPHY (ERCP);  Surgeon: William Outlaw, MD;  Location: [facility];  Service: Endoscopy;  Laterality: N/A;  Nicole requested to move pt due to another pt cancel  . Sphincterotomy 06/13/2011    Procedure: SPHINCTEROTOMY;  Surgeon: William Outlaw, MD;  Location: [facility];  Service: Endoscopy;;  . Biliary stent placement 06/13/2011    Procedure: BILIARY STENT PLACEMENT;  Surgeon: William Outlaw, MD;  Location: [facility];  Service: Endoscopy;  Laterality: N/A;  . Ercp 08/22/2011    Procedure: ENDOSCOPIC RETROGRADE CHOLANGIOPANCREATOGRAPHY (ERCP);  Surgeon: William Outlaw, MD;  Location: [facility];  Service: Endoscopy;  Laterality: N/A;  . Ercp 10/24/2011    Procedure: ENDOSCOPIC RETROGRADE CHOLANGIOPANCREATOGRAPHY (ERCP);  Surgeon: William Outlaw, MD;  Location: [facility];  Service: Endoscopy;  Laterality: N/A;  Need Mccall Pera to help with case, Need to order Lithotripter,  10/03/11 lithotripter ordered by Shannon Hicks office notified. ,  . Spyglass lithotripsy 10/24/2011    Procedure: SPYGLASS LITHOTRIPSY;  Surgeon: William Outlaw, MD;  Location: [facility];  Service: Endoscopy;  Laterality: N/A;    Family History  Problem Relation Age of Onset  . Colon cancer Mother   . Cancer Mother     colon  . Heart attack Father   . Hypertension Father   . Hypertension Sister   .   Diabetes Sister     Social History History  Substance Use Topics  . Smoking status: Never Smoker   . Smokeless tobacco: Not on file  . Alcohol Use: No    Allergies  Allergen Reactions  . Actos (Pioglitazone)     Elevated kidney function  . Hctz (Hydrochlorothiazide)     Current Outpatient Prescriptions  Medication Sig Dispense Refill  . ACCU-CHEK AVIVA PLUS test strip as directed.      . diltiazem (CARDIZEM CD) 240 MG 24 hr capsule Take 240 mg by mouth 2 (two) times daily.       .  dipyridamole-aspirin (AGGRENOX) 200-25 MG per 12 hr capsule Take 1 capsule by mouth 2 (two) times daily.      . ezetimibe-simvastatin (VYTORIN) 10-80 MG per tablet Take 1 tablet by mouth at bedtime.        . insulin regular (HUMULIN R,NOVOLIN R) 100 units/mL injection Inject into the skin 3 (three) times daily before meals. 14units tid Dr. Kerrr       . Insulin Syringe-Needle U-100 (INSULIN SYRINGE .3CC/29GX1/2") 29G X 1/2" 0.3 ML MISC Inject as directed as directed.      . itraconazole (SPORANOX) 10 MG/ML solution 200 mg daily. As directed      . metoprolol (LOPRESSOR) 100 MG tablet Take 100 mg by mouth 2 (two) times daily.       . omeprazole (PRILOSEC) 10 MG capsule Take 10 mg by mouth 2 (two) times daily.      . ramipril (ALTACE) 10 MG tablet Take 10 mg by mouth daily.          Review of Systems Review of Systems  Constitutional: Negative for fever, chills, appetite change and unexpected weight change.       Walks on a treadmill daily  HENT: Negative for congestion and trouble swallowing.   Eyes: Negative for visual disturbance.  Respiratory: Negative for chest tightness and shortness of breath.   Cardiovascular: Positive for leg swelling (some occassionally). Negative for chest pain.       No PND, no orthopnea, no DOE  Gastrointestinal:       See HPI  Genitourinary: Negative for dysuria and hematuria.  Musculoskeletal: Negative.   Skin: Negative for rash.  Neurological: Negative for seizures and speech difficulty.  Hematological: Does not bruise/bleed easily.  Psychiatric/Behavioral: Negative for behavioral problems and confusion.    Blood pressure 140/86, pulse 76, temperature 97.4 F (36.3 C), resp. rate 14, height 5' 10" (1.778 m), weight 273 lb 9.6 oz (124.104 kg).  Physical Exam Physical Exam  Vitals reviewed. Constitutional: He is oriented to person, place, and time. He appears well-developed and well-nourished. No distress.  HENT:  Head: Normocephalic and atraumatic.    Right Ear: External ear normal.  Left Ear: External ear normal.  Eyes: Conjunctivae normal are normal. No scleral icterus.  Neck: Neck supple. No tracheal deviation present. No thyromegaly present.  Cardiovascular: Normal rate, regular rhythm, normal heart sounds and intact distal pulses.   Pulmonary/Chest: Effort normal and breath sounds normal. No respiratory distress. He has no wheezes.    Abdominal: Soft. Bowel sounds are normal. He exhibits no distension. There is no tenderness. There is no rebound.  Musculoskeletal: He exhibits no edema and no tenderness.  Lymphadenopathy:    He has no cervical adenopathy.  Neurological: He is alert and oriented to person, place, and time.  Skin: Skin is warm and dry. No rash noted. He is not diaphoretic. No erythema. No pallor.  Psychiatric:   He has a normal mood and affect. His behavior is normal. Judgment and thought content normal.    Data Reviewed All ERCPs 5/8, 7/17, 9/18 Dr Brackbill's note MRCP report  Assessment    Cholelithiasis H/o choledocholithiasis requiring multiple ERCPs for CBD stone extraction    Plan    I believe the patient will benefit from cholecystectomy given his recent history and having a 1cm gallstone in the cystic duct.  We discussed gallbladder disease. The patient was given educational material. We discussed non-operative and operative management. We discussed the signs & symptoms of acute cholecystitis  I discussed laparoscopic cholecystectomy with IOC in detail.  The patient was given educational material as well as diagrams detailing the procedure.  We discussed the risks and benefits of a laparoscopic cholecystectomy including, but not limited to bleeding, infection, injury to surrounding structures such as the intestine or liver, bile leak, retained gallstones, need to convert to an open procedure, prolonged diarrhea, blood clots such as  DVT, common bile duct injury, anesthesia risks, and possible need for  additional procedures.  We discussed the typical post-operative recovery course. I explained that the likelihood of improvement of their symptoms is good.  We will hold his Aggrenox perioperatively.  Toniesha Zellner M. Rumaisa Schnetzer, MD, FACS General, Bariatric, & Minimally Invasive Surgery [facility]         Monnica Saltsman M 12/27/2011, 11:23 AM    

## 2012-01-15 ENCOUNTER — Encounter (HOSPITAL_COMMUNITY): Payer: Self-pay | Admitting: Pharmacy Technician

## 2012-01-15 NOTE — Pre-Procedure Instructions (Signed)
20 Justin Bartlett  01/15/2012   Your procedure is scheduled on:  Monday, December 16th  Report to [facility] at 1145 AM.  Call this number if you have problems the morning of surgery: [phone]   Remember:   Do not eat food or drink:After Midnight.   Take these medicines the morning of surgery with A SIP OF WATER: cardizem, lopressor, prilosec   Do not wear jewelry, make-up or nail polish.  Do not wear lotions, powders, or perfumes.  Do not shave 48 hours prior to surgery. Men may shave face and neck.  Do not bring valuables to the hospital.  Contacts, dentures or bridgework may not be worn into surgery.  Leave suitcase in the car. After surgery it may be brought to your room.  For patients admitted to the hospital, checkout time is 11:00 AM the day of discharge.   Patients discharged the day of surgery will not be allowed to drive home.    Special Instructions: Shower using CHG 2 nights before surgery and the night before surgery.  If you shower the day of surgery use CHG.  Use special wash - you have one bottle of CHG for all showers.  You should use approximately 1/3 of the bottle for each shower.   Please read over the following fact sheets that you were given: Pain Booklet, Coughing and Deep Breathing, MRSA Information and Surgical Site Infection Prevention

## 2012-01-16 ENCOUNTER — Telehealth (INDEPENDENT_AMBULATORY_CARE_PROVIDER_SITE_OTHER): Payer: Self-pay | Admitting: General Surgery

## 2012-01-16 ENCOUNTER — Encounter (HOSPITAL_COMMUNITY): Payer: Self-pay

## 2012-01-16 ENCOUNTER — Encounter (HOSPITAL_COMMUNITY)
Admission: RE | Admit: 2012-01-16 | Discharge: 2012-01-16 | Disposition: A | Discharge status: Home / Self Care | Payer: BC Managed Care – PPO | Source: Ambulatory Visit | Attending: Anesthesiology | Admitting: Anesthesiology

## 2012-01-16 ENCOUNTER — Encounter (HOSPITAL_COMMUNITY)
Admission: RE | Admit: 2012-01-16 | Discharge: 2012-01-16 | Disposition: A | Discharge status: Home / Self Care | Payer: BC Managed Care – PPO | Source: Ambulatory Visit | Attending: General Surgery | Admitting: General Surgery

## 2012-01-16 HISTORY — DX: Edema, unspecified: R60.9

## 2012-01-16 LAB — CBC WITH DIFFERENTIAL/PLATELET
Eosinophils Absolute: 0.5 10*3/uL (ref 0.0–0.7)
Eosinophils Relative: 4 % (ref 0–5)
HCT: 41 % (ref 39.0–52.0)
Hemoglobin: 13 g/dL (ref 13.0–17.0)
Lymphocytes Relative: 12 % (ref 12–46)
Lymphs Abs: 1.3 10*3/uL (ref 0.7–4.0)
MCH: 28.2 pg (ref 26.0–34.0)
MCV: 88.9 fL (ref 78.0–100.0)
Monocytes Absolute: 1.2 10*3/uL — ABNORMAL HIGH (ref 0.1–1.0)
Monocytes Relative: 11 % (ref 3–12)
Platelets: 224 10*3/uL (ref 150–400)
RBC: 4.61 MIL/uL (ref 4.22–5.81)

## 2012-01-16 LAB — COMPREHENSIVE METABOLIC PANEL
BUN: 23 mg/dL (ref 6–23)
CO2: 25 mEq/L (ref 19–32)
Calcium: 8.9 mg/dL (ref 8.4–10.5)
Creatinine, Ser: 1.78 mg/dL — ABNORMAL HIGH (ref 0.50–1.35)
GFR calc Af Amer: 46 mL/min — ABNORMAL LOW (ref >90)
GFR calc non Af Amer: 39 mL/min — ABNORMAL LOW (ref >90)
Glucose, Bld: 211 mg/dL — ABNORMAL HIGH (ref 70–99)
Total Protein: 6.9 g/dL (ref 6.0–8.3)

## 2012-01-16 LAB — SURGICAL PCR SCREEN
MRSA, PCR: NEGATIVE
Staphylococcus aureus: POSITIVE — AB

## 2012-01-16 LAB — PROTIME-INR: Prothrombin Time: 14.1 seconds (ref 11.6–15.2)

## 2012-01-16 NOTE — Progress Notes (Signed)
Fasting blood sugar on average runs about 125

## 2012-01-16 NOTE — Telephone Encounter (Signed)
Left message on machine for patient to call back and ask for me. TO make sure he has stopped aggrenox.

## 2012-01-16 NOTE — Telephone Encounter (Signed)
Message copied by Liliana Cline on Wed Jan 16, 2012  2:09 PM ------      Message from: [name], ERIC M      Created: Wed Jan 16, 2012  1:14 PM       Labs ok for surgery. Also pls call pt and make sure he stops aggrenox 5 days before surgery

## 2012-01-16 NOTE — Progress Notes (Signed)
Dr.Gary Rankin is eye specialist

## 2012-01-16 NOTE — Progress Notes (Addendum)
Cardiologist is Dr.Brackbill-last visit was about 4months ago-  Echo reports in epic from 2005/2011  Stress test done in 2012-to request from Dr.Brackbill  Heart cath in epic from 2001  Medical MD is Dr.Rober Ehinger  EKG in epic from 09/14/11  Denies CXR within past yr;last one was in 2001

## 2012-01-16 NOTE — Pre-Procedure Instructions (Signed)
20 NATTHEW MARLATT  01/16/2012   Your procedure is scheduled on:  Mon, Dec 16 @ 1:45 PM  Report to [facility] at 11:45 AM.  Call this number if you have problems the morning of surgery: [phone]   Remember:   Do not eat food:After Midnight.    Take these medicines the morning of surgery with A SIP OF WATER: Diltiazem(Cardizem),Metoprolol(Lopressor),and Omeprazole(Prilosec)               Discontinue Aspirin,Plavix,Blood Thinners,and Herbal Medications!!   Do not wear jewelry  Do not wear lotions, powders, or colognes. You may wear deodorant.  Men may shave face and neck.  Do not bring valuables to the hospital.  Contacts, dentures or bridgework may not be worn into surgery.  Leave suitcase in the car. After surgery it may be brought to your room.  For patients admitted to the hospital, checkout time is 11:00 AM the day of discharge.   Patients discharged the day of surgery will not be allowed to drive home.    Special Instructions: Shower using CHG 2 nights before surgery and the night before surgery.  If you shower the day of surgery use CHG.  Use special wash - you have one bottle of CHG for all showers.  You should use approximately 1/3 of the bottle for each shower.   Please read over the following fact sheets that you were given: Pain Booklet, Coughing and Deep Breathing, MRSA Information and Surgical Site Infection Prevention

## 2012-01-16 NOTE — Progress Notes (Signed)
01/16/12 0836  OBSTRUCTIVE SLEEP APNEA  Have you ever been diagnosed with sleep apnea through a sleep study? No  Do you snore loudly (loud enough to be heard through closed doors)?  0  Do you often feel tired, fatigued, or sleepy during the daytime? 0  Has anyone observed you stop breathing during your sleep? 0  Do you have, or are you being treated for high blood pressure? 1  BMI more than 35 kg/m2? 1  Age over 61 years old? 1  Neck circumference greater than 40 cm/18 inches? 1  Gender: 1  Obstructive Sleep Apnea Score 5   Score 4 or greater  Results sent to PCP

## 2012-01-16 NOTE — Progress Notes (Addendum)
Dr.Kerr is Endocrinologist  Dr.Outlaw is Solicitor

## 2012-01-17 ENCOUNTER — Telehealth (INDEPENDENT_AMBULATORY_CARE_PROVIDER_SITE_OTHER): Payer: Self-pay

## 2012-01-17 NOTE — Telephone Encounter (Signed)
Pt returning nurse's telephone call.  He confirmed that he had taken his last dose of Aggrenox on 01/15/12, which means he would be off of the medicine 5 days prior to his surgery.

## 2012-01-17 NOTE — Consult Note (Signed)
Anesthesia chart review: Patient is a 61 year old male scheduled for laparoscopic cholecystectomy by Dr. [name] on 01/21/2012. He was initially seen by [facility] in March 2013 for elevated transaminases. He was referred to gastroenterology and underwent an ERCP which showed a distal common bile duct stone with common bile duct dilatation as well as a 1 cm cystic duct stone and distended gallbladder without signs of inflammation. He has undergone multiple ERCPs stents and finally on 10/24/2011 the distal common bile duct stone was extracted.   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 Sharl Ma).   Cardiologist is Dr. Cassell Clement, last visit was 09/14/11. He was aware that patient was undergoing evaluation and treatment for choledocholithiasis.  EKG then showed sinus rhythm with first degree heart block and a pattern of a prior anteroseptal myocardial infarction with secondary ST-T wave changes, lateral T wave inversion. It was not felt changed since the previous tracing on 08/07/10.  By notes, patient was asymptomatic from his ischemic heart disease and exercising several times a week on a treadmill at that time.  Echocardiogram on 12/14/2009 showed: - Left ventricle: The cavity size was normal. Wall thickness was increased in a pattern of mild LVH. Systolic function was mildly to moderately reduced. The estimated ejection fraction was in the range of 40% to 45%. Diffuse hypokinesis. - Left atrium: The atrium was mildly dilated.  Nuclear stress test on 06/23/2008 showed old anteroapical infarct with minimal reversible peri-infarct ischemia, LV systolic dysfunction,EF 41%.  It was not felt significantly changed since his previous nuclear stress test on 06/19/2006.  His last  cardiac cath was on 02/09/99 prior to his CABG.  Chest x-ray on 01/16/2012 showed no acute cardiopulmonary abnormalities.   Preoperative labs noted. Creatinine 1.78, stable since at least 05/2007.  Glucose 211.  AST/ALT 46/59.  WBC 10.6.  H/H and PT/PTT WNL.  He will get a CBG on arrival.  Patient has had cardiology follow-up within the past 4 1/2 months.  He reported asymptomatic, regular treadmill use at that time.  Dr. Patty Sermons was aware of on-going follow-up for gallbladder disease (although his note indicates that patient was already s/p cholecystectomy).  If patient remains asymptomatic from a CV standpoint then anticipate he can proceed as planned.  Anesthesiologist Dr. Chaney Malling agrees with this plan.   Shonna Chock, PA-C 01/17/12 1705

## 2012-01-20 MED ORDER — DEXTROSE 5 % IV SOLN
2.0000 g | INTRAVENOUS | Status: AC
  Administered 2012-01-21: 2 g via INTRAVENOUS
  Filled 2012-01-20: qty 2

## 2012-01-20 MED ORDER — DEXTROSE 5 % IV SOLN
3.0000 g | INTRAVENOUS | Status: DC
  Filled 2012-01-20: qty 3

## 2012-01-21 ENCOUNTER — Ambulatory Visit (HOSPITAL_COMMUNITY): Payer: BC Managed Care – PPO

## 2012-01-21 ENCOUNTER — Encounter (HOSPITAL_COMMUNITY): Payer: Self-pay | Admitting: Vascular Surgery

## 2012-01-21 ENCOUNTER — Encounter (HOSPITAL_COMMUNITY)
Admission: RE | Disposition: A | Discharge status: Home / Self Care | Payer: Self-pay | Source: Ambulatory Visit | Attending: General Surgery

## 2012-01-21 ENCOUNTER — Encounter (HOSPITAL_COMMUNITY): Payer: Self-pay | Admitting: Surgery

## 2012-01-21 ENCOUNTER — Inpatient Hospital Stay (HOSPITAL_COMMUNITY)
Admission: RE | Admit: 2012-01-21 | Discharge: 2012-01-23 | DRG: 197 | Disposition: A | Discharge status: Home / Self Care | Payer: BC Managed Care – PPO | Source: Ambulatory Visit | Attending: General Surgery | Admitting: General Surgery

## 2012-01-21 ENCOUNTER — Ambulatory Visit (HOSPITAL_COMMUNITY): Payer: BC Managed Care – PPO | Admitting: Vascular Surgery

## 2012-01-21 DIAGNOSIS — E875 Hyperkalemia: Secondary | ICD-10-CM | POA: Diagnosis not present

## 2012-01-21 DIAGNOSIS — I129 Hypertensive chronic kidney disease with stage 1 through stage 4 chronic kidney disease, or unspecified chronic kidney disease: Secondary | ICD-10-CM | POA: Diagnosis present

## 2012-01-21 DIAGNOSIS — I2510 Atherosclerotic heart disease of native coronary artery without angina pectoris: Secondary | ICD-10-CM | POA: Diagnosis present

## 2012-01-21 DIAGNOSIS — E119 Type 2 diabetes mellitus without complications: Secondary | ICD-10-CM | POA: Diagnosis present

## 2012-01-21 DIAGNOSIS — Z6841 Body Mass Index (BMI) 40.0 and over, adult: Secondary | ICD-10-CM

## 2012-01-21 DIAGNOSIS — E6609 Other obesity due to excess calories: Secondary | ICD-10-CM | POA: Diagnosis present

## 2012-01-21 DIAGNOSIS — Z5331 Laparoscopic surgical procedure converted to open procedure: Secondary | ICD-10-CM

## 2012-01-21 DIAGNOSIS — K8010 Calculus of gallbladder with chronic cholecystitis without obstruction: Principal | ICD-10-CM | POA: Diagnosis present

## 2012-01-21 DIAGNOSIS — N189 Chronic kidney disease, unspecified: Secondary | ICD-10-CM | POA: Diagnosis present

## 2012-01-21 DIAGNOSIS — K219 Gastro-esophageal reflux disease without esophagitis: Secondary | ICD-10-CM | POA: Diagnosis present

## 2012-01-21 DIAGNOSIS — K8020 Calculus of gallbladder without cholecystitis without obstruction: Secondary | ICD-10-CM

## 2012-01-21 DIAGNOSIS — Z794 Long term (current) use of insulin: Secondary | ICD-10-CM

## 2012-01-21 DIAGNOSIS — E7800 Pure hypercholesterolemia, unspecified: Secondary | ICD-10-CM | POA: Diagnosis present

## 2012-01-21 DIAGNOSIS — Z01818 Encounter for other preprocedural examination: Secondary | ICD-10-CM

## 2012-01-21 DIAGNOSIS — Z23 Encounter for immunization: Secondary | ICD-10-CM

## 2012-01-21 DIAGNOSIS — K812 Acute cholecystitis with chronic cholecystitis: Secondary | ICD-10-CM

## 2012-01-21 DIAGNOSIS — I119 Hypertensive heart disease without heart failure: Secondary | ICD-10-CM | POA: Diagnosis present

## 2012-01-21 DIAGNOSIS — Z01812 Encounter for preprocedural laboratory examination: Secondary | ICD-10-CM

## 2012-01-21 DIAGNOSIS — Z8673 Personal history of transient ischemic attack (TIA), and cerebral infarction without residual deficits: Secondary | ICD-10-CM

## 2012-01-21 HISTORY — PX: CHOLECYSTECTOMY: SHX55

## 2012-01-21 HISTORY — PX: INTRAOPERATIVE CHOLANGIOGRAM: SHX5230

## 2012-01-21 LAB — GLUCOSE, CAPILLARY

## 2012-01-21 SURGERY — CHOLECYSTECTOMY
Anesthesia: General | Site: Abdomen | Wound class: Clean Contaminated

## 2012-01-21 MED ORDER — VECURONIUM BROMIDE 10 MG IV SOLR
INTRAVENOUS | Status: DC | PRN
  Administered 2012-01-21: 1 mg via INTRAVENOUS
  Administered 2012-01-21: 2 mg via INTRAVENOUS

## 2012-01-21 MED ORDER — NALOXONE HCL 0.4 MG/ML IJ SOLN: 0.4000 mg | INTRAMUSCULAR | Status: DC | PRN

## 2012-01-21 MED ORDER — PHENYLEPHRINE HCL 10 MG/ML IJ SOLN
10.0000 mg | INTRAVENOUS | Status: DC | PRN
  Administered 2012-01-21: 10 ug/min via INTRAVENOUS

## 2012-01-21 MED ORDER — DILTIAZEM HCL ER COATED BEADS 240 MG PO CP24
240.0000 mg | ORAL_CAPSULE | Freq: Two times a day (BID) | ORAL | Status: DC
  Filled 2012-01-21: qty 1

## 2012-01-21 MED ORDER — RAMIPRIL 10 MG PO TABS: 10.0000 mg | ORAL_TABLET | Freq: Every day | ORAL | Status: DC

## 2012-01-21 MED ORDER — SODIUM CHLORIDE 0.9 % IR SOLN
Status: DC | PRN
  Administered 2012-01-21: 1

## 2012-01-21 MED ORDER — PANTOPRAZOLE SODIUM 40 MG IV SOLR
40.0000 mg | INTRAVENOUS | Status: DC
  Administered 2012-01-22: 40 mg via INTRAVENOUS
  Filled 2012-01-21 (×2): qty 40

## 2012-01-21 MED ORDER — LACTATED RINGERS IV SOLN
INTRAVENOUS | Status: DC | PRN
  Administered 2012-01-21 (×2): via INTRAVENOUS

## 2012-01-21 MED ORDER — FENTANYL CITRATE 0.05 MG/ML IJ SOLN
INTRAMUSCULAR | Status: DC | PRN
  Administered 2012-01-21 (×4): 50 ug via INTRAVENOUS
  Administered 2012-01-21: 100 ug via INTRAVENOUS

## 2012-01-21 MED ORDER — EPHEDRINE SULFATE 50 MG/ML IJ SOLN
INTRAMUSCULAR | Status: DC | PRN
  Administered 2012-01-21 (×4): 5 mg via INTRAVENOUS
  Administered 2012-01-21: 10 mg via INTRAVENOUS

## 2012-01-21 MED ORDER — SODIUM CHLORIDE 0.9 % IJ SOLN: 9.0000 mL | INTRAMUSCULAR | Status: DC | PRN

## 2012-01-21 MED ORDER — ACETAMINOPHEN 10 MG/ML IV SOLN
INTRAVENOUS | Status: AC
  Filled 2012-01-21: qty 100

## 2012-01-21 MED ORDER — SODIUM CHLORIDE 0.9 % IV SOLN
INTRAVENOUS | Status: DC | PRN
  Administered 2012-01-21: 14:00:00

## 2012-01-21 MED ORDER — DIPHENHYDRAMINE HCL 12.5 MG/5ML PO ELIX
12.5000 mg | ORAL_SOLUTION | Freq: Four times a day (QID) | ORAL | Status: DC | PRN
  Filled 2012-01-21: qty 5

## 2012-01-21 MED ORDER — ROCURONIUM BROMIDE 100 MG/10ML IV SOLN
INTRAVENOUS | Status: DC | PRN
  Administered 2012-01-21: 20 mg via INTRAVENOUS
  Administered 2012-01-21 (×2): 10 mg via INTRAVENOUS
  Administered 2012-01-21: 30 mg via INTRAVENOUS
  Administered 2012-01-21 (×3): 10 mg via INTRAVENOUS

## 2012-01-21 MED ORDER — DILTIAZEM HCL ER COATED BEADS 240 MG PO CP24
240.0000 mg | ORAL_CAPSULE | Freq: Every day | ORAL | Status: DC
  Administered 2012-01-22: 240 mg via ORAL
  Filled 2012-01-21 (×3): qty 1

## 2012-01-21 MED ORDER — BUPIVACAINE-EPINEPHRINE PF 0.25-1:200000 % IJ SOLN
INTRAMUSCULAR | Status: AC
  Filled 2012-01-21: qty 30

## 2012-01-21 MED ORDER — LACTATED RINGERS IV SOLN
INTRAVENOUS | Status: DC
  Administered 2012-01-21: 13:00:00 via INTRAVENOUS

## 2012-01-21 MED ORDER — GLYCOPYRROLATE 0.2 MG/ML IJ SOLN
INTRAMUSCULAR | Status: DC | PRN
  Administered 2012-01-21: .8 mg via INTRAVENOUS

## 2012-01-21 MED ORDER — ONDANSETRON HCL 4 MG/2ML IJ SOLN: 4.0000 mg | Freq: Once | INTRAMUSCULAR | Status: DC | PRN

## 2012-01-21 MED ORDER — HYDROMORPHONE 0.3 MG/ML IV SOLN
INTRAVENOUS | Status: DC
  Administered 2012-01-22 (×2): 0.6 mg via INTRAVENOUS
  Administered 2012-01-22: 0.3 mg via INTRAVENOUS

## 2012-01-21 MED ORDER — SUCCINYLCHOLINE CHLORIDE 20 MG/ML IJ SOLN
INTRAMUSCULAR | Status: DC | PRN
  Administered 2012-01-21: 120 mg via INTRAVENOUS

## 2012-01-21 MED ORDER — ARTIFICIAL TEARS OP OINT
TOPICAL_OINTMENT | OPHTHALMIC | Status: DC | PRN
  Administered 2012-01-21: 1 via OPHTHALMIC

## 2012-01-21 MED ORDER — POTASSIUM CHLORIDE IN NACL 20-0.45 MEQ/L-% IV SOLN
INTRAVENOUS | Status: DC
  Administered 2012-01-21 – 2012-01-22 (×2): via INTRAVENOUS
  Filled 2012-01-21 (×4): qty 1000

## 2012-01-21 MED ORDER — METOPROLOL TARTRATE 100 MG PO TABS
100.0000 mg | ORAL_TABLET | Freq: Two times a day (BID) | ORAL | Status: DC
  Administered 2012-01-22 (×2): 100 mg via ORAL
  Filled 2012-01-21 (×5): qty 1

## 2012-01-21 MED ORDER — NEOSTIGMINE METHYLSULFATE 1 MG/ML IJ SOLN
INTRAMUSCULAR | Status: DC | PRN
  Administered 2012-01-21: 5 mg via INTRAVENOUS

## 2012-01-21 MED ORDER — INSULIN ASPART 100 UNIT/ML ~~LOC~~ SOLN
0.0000 [IU] | SUBCUTANEOUS | Status: DC
  Administered 2012-01-21 – 2012-01-22 (×2): 7 [IU] via SUBCUTANEOUS
  Administered 2012-01-22: 11 [IU] via SUBCUTANEOUS
  Administered 2012-01-22: 7 [IU] via SUBCUTANEOUS

## 2012-01-21 MED ORDER — ONDANSETRON HCL 4 MG/2ML IJ SOLN
INTRAMUSCULAR | Status: AC
  Filled 2012-01-21: qty 2

## 2012-01-21 MED ORDER — BUPIVACAINE-EPINEPHRINE 0.25% -1:200000 IJ SOLN
INTRAMUSCULAR | Status: DC | PRN
  Administered 2012-01-21: 14 mL

## 2012-01-21 MED ORDER — DIPHENHYDRAMINE HCL 50 MG/ML IJ SOLN: 12.5000 mg | Freq: Four times a day (QID) | INTRAMUSCULAR | Status: DC | PRN

## 2012-01-21 MED ORDER — ACETAMINOPHEN 10 MG/ML IV SOLN
1000.0000 mg | Freq: Once | INTRAVENOUS | Status: AC | PRN
  Administered 2012-01-21: 1000 mg via INTRAVENOUS

## 2012-01-21 MED ORDER — ONDANSETRON HCL 4 MG/2ML IJ SOLN
4.0000 mg | Freq: Four times a day (QID) | INTRAMUSCULAR | Status: DC | PRN
  Administered 2012-01-21: 4 mg via INTRAVENOUS

## 2012-01-21 MED ORDER — MIDAZOLAM HCL 5 MG/5ML IJ SOLN
INTRAMUSCULAR | Status: DC | PRN
  Administered 2012-01-21 (×2): 0.5 mg via INTRAVENOUS
  Administered 2012-01-21: 1 mg via INTRAVENOUS

## 2012-01-21 MED ORDER — HYDROMORPHONE HCL PF 1 MG/ML IJ SOLN
0.2500 mg | INTRAMUSCULAR | Status: DC | PRN
  Administered 2012-01-21 (×2): 0.5 mg via INTRAVENOUS

## 2012-01-21 MED ORDER — ENOXAPARIN SODIUM 40 MG/0.4ML ~~LOC~~ SOLN
40.0000 mg | SUBCUTANEOUS | Status: DC
  Administered 2012-01-22: 40 mg via SUBCUTANEOUS
  Filled 2012-01-21 (×3): qty 0.4

## 2012-01-21 MED ORDER — DEXTROSE 5 % IV SOLN
INTRAVENOUS | Status: DC | PRN
  Administered 2012-01-21: 14:00:00 via INTRAVENOUS

## 2012-01-21 MED ORDER — HYDROMORPHONE 0.3 MG/ML IV SOLN
INTRAVENOUS | Status: AC
  Administered 2012-01-21: 19:00:00
  Filled 2012-01-21: qty 25

## 2012-01-21 MED ORDER — LIDOCAINE HCL (CARDIAC) 20 MG/ML IV SOLN
INTRAVENOUS | Status: DC | PRN
  Administered 2012-01-21: 80 mg via INTRAVENOUS

## 2012-01-21 MED ORDER — RAMIPRIL 5 MG PO CAPS
10.0000 mg | ORAL_CAPSULE | Freq: Every day | ORAL | Status: DC
  Administered 2012-01-22: 10 mg via ORAL
  Filled 2012-01-21: qty 2

## 2012-01-21 MED ORDER — PROPOFOL 10 MG/ML IV BOLUS
INTRAVENOUS | Status: DC | PRN
  Administered 2012-01-21: 200 mg via INTRAVENOUS

## 2012-01-21 MED ORDER — HYDROMORPHONE HCL PF 1 MG/ML IJ SOLN
INTRAMUSCULAR | Status: AC
  Administered 2012-01-21: 0.5 mg via INTRAVENOUS
  Filled 2012-01-21: qty 1

## 2012-01-21 MED ORDER — ONDANSETRON HCL 4 MG/2ML IJ SOLN
INTRAMUSCULAR | Status: DC | PRN
  Administered 2012-01-21: 4 mg via INTRAVENOUS

## 2012-01-21 MED ORDER — HEMOSTATIC AGENTS (NO CHARGE) OPTIME
TOPICAL | Status: DC | PRN
  Administered 2012-01-21: 1 via TOPICAL

## 2012-01-21 SURGICAL SUPPLY — 62 items
ADH SKN CLS APL DERMABOND .7 (GAUZE/BANDAGES/DRESSINGS)
APPLIER CLIP 5 13 M/L LIGAMAX5 (MISCELLANEOUS) ×3
APR CLP MED LRG 5 ANG JAW (MISCELLANEOUS) ×1
BANDAGE ADHESIVE 1X3 (GAUZE/BANDAGES/DRESSINGS) ×9 IMPLANT
BENZOIN TINCTURE PRP APPL 2/3 (GAUZE/BANDAGES/DRESSINGS) ×3 IMPLANT
BLADE SURG ROTATE 9660 (MISCELLANEOUS) ×3 IMPLANT
CANISTER SUCTION 2500CC (MISCELLANEOUS) ×3 IMPLANT
CATH REDDICK CHOLANGI 4FR 50CM (CATHETERS) ×3 IMPLANT
CHLORAPREP W/TINT 26ML (MISCELLANEOUS) ×3 IMPLANT
CLIP APPLIE 5 13 M/L LIGAMAX5 (MISCELLANEOUS) ×2 IMPLANT
CLOTH BEACON ORANGE TIMEOUT ST (SAFETY) ×3 IMPLANT
COVER MAYO STAND STRL (DRAPES) ×3 IMPLANT
COVER SURGICAL LIGHT HANDLE (MISCELLANEOUS) ×3 IMPLANT
DECANTER SPIKE VIAL GLASS SM (MISCELLANEOUS) ×3 IMPLANT
DERMABOND ADVANCED (GAUZE/BANDAGES/DRESSINGS)
DERMABOND ADVANCED .7 DNX12 (GAUZE/BANDAGES/DRESSINGS) IMPLANT
DRAIN CHANNEL 19F RND (DRAIN) ×3 IMPLANT
DRAPE C-ARM 42X72 X-RAY (DRAPES) ×3 IMPLANT
DRAPE UTILITY 15X26 W/TAPE STR (DRAPE) ×6 IMPLANT
DRESSING TELFA 8X10 (GAUZE/BANDAGES/DRESSINGS) ×3 IMPLANT
DRESSING TELFA 8X3 (GAUZE/BANDAGES/DRESSINGS) ×3 IMPLANT
DRSG TEGADERM 4X4.75 (GAUZE/BANDAGES/DRESSINGS) ×3 IMPLANT
ELECT REM PT RETURN 9FT ADLT (ELECTROSURGICAL) ×3
ELECTRODE REM PT RTRN 9FT ADLT (ELECTROSURGICAL) ×2 IMPLANT
EVACUATOR SILICONE 100CC (DRAIN) ×3 IMPLANT
GAUZE SPONGE 2X2 8PLY STRL LF (GAUZE/BANDAGES/DRESSINGS) ×2 IMPLANT
GLOVE BIOGEL M STRL SZ7.5 (GLOVE) ×9 IMPLANT
GLOVE BIOGEL PI IND STRL 8 (GLOVE) ×6 IMPLANT
GLOVE BIOGEL PI INDICATOR 8 (GLOVE) ×3
GLOVE SURG SIGNA 7.5 PF LTX (GLOVE) ×6 IMPLANT
GOWN PREVENTION PLUS XLARGE (GOWN DISPOSABLE) ×6 IMPLANT
GOWN PREVENTION PLUS XXLARGE (GOWN DISPOSABLE) ×3 IMPLANT
GOWN STRL NON-REIN LRG LVL3 (GOWN DISPOSABLE) ×6 IMPLANT
HEMOSTAT SNOW SURGICEL 2X4 (HEMOSTASIS) ×3 IMPLANT
KIT BASIN OR (CUSTOM PROCEDURE TRAY) ×3 IMPLANT
KIT ROOM TURNOVER OR (KITS) ×3 IMPLANT
NS IRRIG 1000ML POUR BTL (IV SOLUTION) ×3 IMPLANT
PAD ARMBOARD 7.5X6 YLW CONV (MISCELLANEOUS) ×3 IMPLANT
PENCIL BUTTON HOLSTER BLD 10FT (ELECTRODE) ×3 IMPLANT
POUCH SPECIMEN RETRIEVAL 10MM (ENDOMECHANICALS) ×3 IMPLANT
SCISSORS LAP 5X35 DISP (ENDOMECHANICALS) ×3 IMPLANT
SET CHOLANGIOGRAPH 5 50 .035 (SET/KITS/TRAYS/PACK) ×3 IMPLANT
SET IRRIG TUBING LAPAROSCOPIC (IRRIGATION / IRRIGATOR) ×3 IMPLANT
SLEEVE ENDOPATH XCEL 5M (ENDOMECHANICALS) ×9 IMPLANT
SPECIMEN JAR SMALL (MISCELLANEOUS) ×3 IMPLANT
SPONGE GAUZE 2X2 STER 10/PKG (GAUZE/BANDAGES/DRESSINGS) ×1
SPONGE GAUZE 4X4 12PLY (GAUZE/BANDAGES/DRESSINGS) ×3 IMPLANT
SPONGE LAP 18X18 X RAY DECT (DISPOSABLE) ×9 IMPLANT
STAPLER VISISTAT 35W (STAPLE) ×3 IMPLANT
SUCTION POOLE TIP (SUCTIONS) ×3 IMPLANT
SUT ETHILON 2 0 FS 18 (SUTURE) ×3 IMPLANT
SUT MNCRL AB 4-0 PS2 18 (SUTURE) ×3 IMPLANT
SUT PDS AB 1 TP1 96 (SUTURE) ×12 IMPLANT
SUT PROLENE 4 0 RB 1 (SUTURE) ×4
SUT PROLENE 4-0 RB1 .5 CRCL 36 (SUTURE) ×8 IMPLANT
TOWEL OR 17X24 6PK STRL BLUE (TOWEL DISPOSABLE) ×3 IMPLANT
TOWEL OR 17X26 10 PK STRL BLUE (TOWEL DISPOSABLE) ×3 IMPLANT
TRAY LAPAROSCOPIC (CUSTOM PROCEDURE TRAY) ×3 IMPLANT
TROCAR XCEL BLUNT TIP 100MML (ENDOMECHANICALS) ×3 IMPLANT
TROCAR XCEL NON-BLD 5MMX100MML (ENDOMECHANICALS) ×3 IMPLANT
TUBE CONNECTING 12X1/4 (SUCTIONS) ×3 IMPLANT
YANKAUER SUCT BULB TIP NO VENT (SUCTIONS) ×3 IMPLANT

## 2012-01-21 NOTE — Brief Op Note (Signed)
01/21/2012  5:57 PM  PATIENT:  [name]  61 y.o. male  PRE-OPERATIVE DIAGNOSIS:  cholelithiasis, history of CBD stone  POST-OPERATIVE DIAGNOSIS:  cholelithiasis, history of CBD stone  PROCEDURE:  Procedure(s) (LRB) with comments: CHOLECYSTECTOMY (N/A) LAPAROSCOPIC CHOLECYSTECTOMY (N/A) - attempted laparoscopic cholecystectomy INTRAOPERATIVE CHOLANGIOGRAM (N/A)  SURGEON:  Surgeon(s) and Role:    * Atilano Ina, MD,FACS - Primary    * Shelly Rubenstein, MD - Assisting    * Lodema Pilot, DO - Assisting  PHYSICIAN ASSISTANT: none  ASSISTANTS: see above   ANESTHESIA:   general  EBL:  Total I/O In: 1750 [I.V.:1750] Out: 300 [Blood:300]  BLOOD ADMINISTERED:none  DRAINS: (19Fr) Jackson-Pratt drain(s) with closed bulb suction in the morrison's pouch   LOCAL MEDICATIONS USED:  MARCAINE     SPECIMEN:  Source of Specimen:  gallbladder  DISPOSITION OF SPECIMEN:  PATHOLOGY  COUNTS:  YES  TOURNIQUET:  * No tourniquets in log *  DICTATION: .Other Dictation: Dictation Number [id number]  PLAN OF CARE: Admit to inpatient   PATIENT DISPOSITION:  PACU - hemodynamically stable.   Delay start of Pharmacological VTE agent (>24hrs) due to surgical blood loss or risk of bleeding: no [name]. [name], MD, FACS General, Bariatric, & Minimally Invasive Surgery [facility], [place]

## 2012-01-21 NOTE — Transfer of Care (Signed)
Immediate Anesthesia Transfer of Care Note  Patient: Justin Bartlett  Procedure(s) Performed: Procedure(s) (LRB) with comments: CHOLECYSTECTOMY (N/A) LAPAROSCOPIC CHOLECYSTECTOMY (N/A) - attempted laparoscopic cholecystectomy INTRAOPERATIVE CHOLANGIOGRAM (N/A)  Patient Location: PACU  Anesthesia Type:General  Level of Consciousness: awake, alert , oriented and patient cooperative  Airway & Oxygen Therapy: Patient Spontanous Breathing and Patient connected to face mask oxygen  Post-op Assessment: Post -op Vital signs reviewed and stable and Patient moving all extremities  Post vital signs: Reviewed and stable  Complications: No apparent anesthesia complications

## 2012-01-21 NOTE — Preoperative (Signed)
Beta Blockers   Reason not to administer Beta Blockers:Not Applicable 

## 2012-01-21 NOTE — Anesthesia Procedure Notes (Signed)
Procedure Name: Intubation Date/Time: 01/21/2012 2:11 PM Performed by: Julianne Rice K Pre-anesthesia Checklist: Emergency Drugs available, Patient identified, Timeout performed, Suction available and Patient being monitored Patient Re-evaluated:Patient Re-evaluated prior to inductionOxygen Delivery Method: Circle system utilized Preoxygenation: Pre-oxygenation with 100% oxygen Intubation Type: IV induction and Rapid sequence Ventilation: Mask ventilation without difficulty Laryngoscope Size: Mac and 3 Grade View: Grade II Tube type: Oral Tube size: 8.0 mm Number of attempts: 1 Airway Equipment and Method: Stylet and LTA kit utilized Placement Confirmation: ETT inserted through vocal cords under direct vision,  breath sounds checked- equal and bilateral and positive ETCO2 Secured at: 24 cm Tube secured with: Tape Dental Injury: Teeth and Oropharynx as per pre-operative assessment

## 2012-01-21 NOTE — Anesthesia Postprocedure Evaluation (Signed)
Anesthesia Post Note  Patient: Justin Bartlett  Procedure(s) Performed: Procedure(s) (LRB): CHOLECYSTECTOMY (N/A) LAPAROSCOPIC CHOLECYSTECTOMY (N/A) INTRAOPERATIVE CHOLANGIOGRAM (N/A)  Anesthesia type: general  Patient location: PACU  Post pain: Pain level controlled  Post assessment: Patient's Cardiovascular Status Stable  Last Vitals:  Filed Vitals:   01/21/12 1830  BP:   Pulse: 79  Temp:   Resp: 28    Post vital signs: Reviewed and stable  Level of consciousness: sedated  Complications: No apparent anesthesia complications

## 2012-01-21 NOTE — Anesthesia Preprocedure Evaluation (Addendum)
Anesthesia Evaluation  Patient identified by MRN, date of birth, ID band Patient awake    Reviewed: Allergy & Precautions, H&P , NPO status , Patient's Chart, lab work & pertinent test results, reviewed documented beta blocker date and time   Airway       Dental  (+) Edentulous Upper and Partial Lower   Pulmonary neg pulmonary ROS,  breath sounds clear to auscultation        Cardiovascular hypertension, Pt. on medications and Pt. on home beta blockers + CAD and + CABG Rhythm:Regular Rate:Normal     Neuro/Psych CVA, No Residual Symptoms negative psych ROS   GI/Hepatic Neg liver ROS, GERD-  Medicated and Controlled,  Endo/Other  diabetes, Well Controlled, Type 2, Insulin Dependent  Renal/GU      Musculoskeletal negative musculoskeletal ROS (+)   Abdominal (+) + obese,   Peds  Hematology negative hematology ROS (+)   Anesthesia Other Findings   Reproductive/Obstetrics                          Anesthesia Physical Anesthesia Plan  ASA: III  Anesthesia Plan: General   Post-op Pain Management:    Induction: Intravenous  Airway Management Planned: Oral ETT  Additional Equipment:   Intra-op Plan:   Post-operative Plan: Extubation in OR  Informed Consent: I have reviewed the patients History and Physical, chart, labs and discussed the procedure including the risks, benefits and alternatives for the proposed anesthesia with the patient or authorized representative who has indicated his/her understanding and acceptance.   Dental advisory given  Plan Discussed with: CRNA and Surgeon  Anesthesia Plan Comments: (Cholelithiasis with h/o CBD stone Type 2 DM glucose 134 Renal insuff Cr 1.78 CAD S/P CABG 2001, mild peri-infarct ischemia by myoview 06/22/2008 walks on treadmill everyday. No angina Obesity  Plan GA with oral ETT  Kipp Brood, MD)       Anesthesia Quick Evaluation

## 2012-01-21 NOTE — Interval H&P Note (Signed)
History and Physical Interval Note:  01/21/2012 12:58 PM  Justin Bartlett  has presented today for surgery, with the diagnosis of cholelithiasis, history of CBD stone  The various methods of treatment have been discussed with the patient and family. After consideration of risks, benefits and other options for treatment, the patient has consented to  Procedure(s) (LRB) with comments: LAPAROSCOPIC CHOLECYSTECTOMY WITH INTRAOPERATIVE CHOLANGIOGRAM (N/A) as a surgical intervention .  The patient's history has been reviewed, patient examined, no change in status, stable for surgery.  I have reviewed the patient's chart and labs.  Questions were answered to the patient's satisfaction.    [name]. [name], MD, FACS General, Bariatric, & Minimally Invasive Surgery [facility], [place]   [facility] M

## 2012-01-21 NOTE — H&P (View-Only) (Signed)
Patient ID: Justin Bartlett, male   DOB: 1950-07-13, 61 y.o.   MRN: [id number]  Chief Complaint  Patient presents with  . Abdominal Pain    HPI Justin Bartlett is a 61 y.o. male.   HPI 61 year old Caucasian male comes back in to discuss gallbladder surgery. I initially saw him back in March of this year for elevated liver function test as well as a gallstone seen on his abdominal ultrasound. At that time he had absolutely no symptoms. I referred him to gastroenterology. He ultimately underwent an MRCP which showed a distal common bile duct stone with common bile duct dilatation as well as a 1 cm cystic duct stone and a distended gallbladder without any signs of inflammation. He underwent multiple ERCPs by Dr. Dulce Sellar throughout the spring and summer. He had an impacted distal common bile duct stone that was unable to be extracted during the first 2 ERCPs. He underwent sphincterotomies as well as biliary and pancreatic duct stent placement. He was also placed on ursodiol during this time. On September 18 he underwent a repeat ERCP in the distal common bile duct stone was able to be extracted. Since that time he states that he has done well. He denies any abdominal pain, bloating, nausea, vomiting, indigestion, diarrhea, or constipation. He denies any acholic stools. He denies any icterus or jaundice. He denies any itching. Past Medical History  Diagnosis Date  . Hypertension   . Diabetes mellitus     insulin dependent  . Coronary artery disease   . Gallstones   . Hypercholesterolemia   . Chronic kidney disease   . Stroke 2006  . History of stomach ulcers     upon EGD with Dr. Randa Evens  . GERD (gastroesophageal reflux disease)   . Toenail fungus     Past Surgical History  Procedure Date  . Cardiac catheterization 02/09/1999    LARGE AREA OF ANTERIOR AND APICAL  HYPOKINESIS PRESENT. EF 35%  . Cardiovascular stress test 06/2008    EF 41%  . Ankle surgery 1982    stainless steel rod and screws, LT  ankle  . Coronary artery bypass graft 2001  . Ercp 06/13/2011    Procedure: ENDOSCOPIC RETROGRADE CHOLANGIOPANCREATOGRAPHY (ERCP);  Surgeon: Willis Modena, MD;  Location: [facility];  Service: Endoscopy;  Laterality: N/A;  Joni Reining requested to move pt due to another pt cancel  . Sphincterotomy 06/13/2011    Procedure: SPHINCTEROTOMY;  Surgeon: Willis Modena, MD;  Location: [facility];  Service: Endoscopy;;  . Biliary stent placement 06/13/2011    Procedure: BILIARY STENT PLACEMENT;  Surgeon: Willis Modena, MD;  Location: [facility];  Service: Endoscopy;  Laterality: N/A;  . Ercp 08/22/2011    Procedure: ENDOSCOPIC RETROGRADE CHOLANGIOPANCREATOGRAPHY (ERCP);  Surgeon: Willis Modena, MD;  Location: [facility];  Service: Endoscopy;  Laterality: N/A;  . Ercp 10/24/2011    Procedure: ENDOSCOPIC RETROGRADE CHOLANGIOPANCREATOGRAPHY (ERCP);  Surgeon: Willis Modena, MD;  Location: [facility];  Service: Endoscopy;  Laterality: N/A;  Need Mccall Pera to help with case, Need to order Lithotripter,  10/03/11 lithotripter ordered by [facility] office notified. ,  . Spyglass lithotripsy 10/24/2011    Procedure: [id number] LITHOTRIPSY;  Surgeon: Willis Modena, MD;  Location: [facility];  Service: Endoscopy;  Laterality: N/A;    Family History  Problem Relation Age of Onset  . Colon cancer Mother   . Cancer Mother     colon  . Heart attack Father   . Hypertension Father   . Hypertension Sister   .  Diabetes Sister     Social History History  Substance Use Topics  . Smoking status: Never Smoker   . Smokeless tobacco: Not on file  . Alcohol Use: No    Allergies  Allergen Reactions  . Actos (Pioglitazone)     Elevated kidney function  . Hctz (Hydrochlorothiazide)     Current Outpatient Prescriptions  Medication Sig Dispense Refill  . ACCU-CHEK AVIVA PLUS test strip as directed.      . diltiazem (CARDIZEM CD) 240 MG 24 hr capsule Take 240 mg by mouth 2 (two) times daily.       Marland Kitchen  dipyridamole-aspirin (AGGRENOX) 200-25 MG per 12 hr capsule Take 1 capsule by mouth 2 (two) times daily.      Marland Kitchen ezetimibe-simvastatin (VYTORIN) 10-80 MG per tablet Take 1 tablet by mouth at bedtime.        . insulin regular (HUMULIN R,NOVOLIN R) 100 units/mL injection Inject into the skin 3 (three) times daily before meals. 14units tid Dr. Jamison Oka       . Insulin Syringe-Needle U-100 (INSULIN SYRINGE .3CC/29GX1/2") 29G X 1/2" 0.3 ML MISC Inject as directed as directed.      . itraconazole (SPORANOX) 10 MG/ML solution 200 mg daily. As directed      . metoprolol (LOPRESSOR) 100 MG tablet Take 100 mg by mouth 2 (two) times daily.       Marland Kitchen omeprazole (PRILOSEC) 10 MG capsule Take 10 mg by mouth 2 (two) times daily.      . ramipril (ALTACE) 10 MG tablet Take 10 mg by mouth daily.          Review of Systems Review of Systems  Constitutional: Negative for fever, chills, appetite change and unexpected weight change.       Walks on a treadmill daily  HENT: Negative for congestion and trouble swallowing.   Eyes: Negative for visual disturbance.  Respiratory: Negative for chest tightness and shortness of breath.   Cardiovascular: Positive for leg swelling (some occassionally). Negative for chest pain.       No PND, no orthopnea, no DOE  Gastrointestinal:       See HPI  Genitourinary: Negative for dysuria and hematuria.  Musculoskeletal: Negative.   Skin: Negative for rash.  Neurological: Negative for seizures and speech difficulty.  Hematological: Does not bruise/bleed easily.  Psychiatric/Behavioral: Negative for behavioral problems and confusion.    Blood pressure 140/86, pulse 76, temperature 97.4 F (36.3 C), resp. rate 14, height 5\' 10"  (1.778 m), weight 273 lb 9.6 oz (124.104 kg).  Physical Exam Physical Exam  Vitals reviewed. Constitutional: He is oriented to person, place, and time. He appears well-developed and well-nourished. No distress.  HENT:  Head: Normocephalic and atraumatic.    Right Ear: External ear normal.  Left Ear: External ear normal.  Eyes: Conjunctivae normal are normal. No scleral icterus.  Neck: Neck supple. No tracheal deviation present. No thyromegaly present.  Cardiovascular: Normal rate, regular rhythm, normal heart sounds and intact distal pulses.   Pulmonary/Chest: Effort normal and breath sounds normal. No respiratory distress. He has no wheezes.    Abdominal: Soft. Bowel sounds are normal. He exhibits no distension. There is no tenderness. There is no rebound.  Musculoskeletal: He exhibits no edema and no tenderness.  Lymphadenopathy:    He has no cervical adenopathy.  Neurological: He is alert and oriented to person, place, and time.  Skin: Skin is warm and dry. No rash noted. He is not diaphoretic. No erythema. No pallor.  Psychiatric:  He has a normal mood and affect. His behavior is normal. Judgment and thought content normal.    Data Reviewed All ERCPs 5/8, 7/17, 9/18 Dr Yevonne Pax note MRCP report  Assessment    Cholelithiasis H/o choledocholithiasis requiring multiple ERCPs for CBD stone extraction    Plan    I believe the patient will benefit from cholecystectomy given his recent history and having a 1cm gallstone in the cystic duct.  We discussed gallbladder disease. The patient was given Agricultural engineer. We discussed non-operative and operative management. We discussed the signs & symptoms of acute cholecystitis  I discussed laparoscopic cholecystectomy with IOC in detail.  The patient was given educational material as well as diagrams detailing the procedure.  We discussed the risks and benefits of a laparoscopic cholecystectomy including, but not limited to bleeding, infection, injury to surrounding structures such as the intestine or liver, bile leak, retained gallstones, need to convert to an open procedure, prolonged diarrhea, blood clots such as  DVT, common bile duct injury, anesthesia risks, and possible need for  additional procedures.  We discussed the typical post-operative recovery course. I explained that the likelihood of improvement of their symptoms is good.  We will hold his Aggrenox perioperatively.  [name]. [name], MD, FACS General, Bariatric, & Minimally Invasive Surgery [facility] Surgery, [place]         [facility] M 12/27/2011, 11:23 AM

## 2012-01-22 ENCOUNTER — Encounter (HOSPITAL_COMMUNITY): Payer: Self-pay | Admitting: *Deleted

## 2012-01-22 LAB — BASIC METABOLIC PANEL
BUN: 30 mg/dL — ABNORMAL HIGH (ref 6–23)
CO2: 23 mEq/L (ref 19–32)
GFR calc non Af Amer: 33 mL/min — ABNORMAL LOW (ref >90)
Glucose, Bld: 218 mg/dL — ABNORMAL HIGH (ref 70–99)
Potassium: 6.3 mEq/L (ref 3.5–5.1)

## 2012-01-22 LAB — CBC
HCT: 40.8 % (ref 39.0–52.0)
Hemoglobin: 12.9 g/dL — ABNORMAL LOW (ref 13.0–17.0)
MCHC: 31.6 g/dL (ref 30.0–36.0)
RBC: 4.63 MIL/uL (ref 4.22–5.81)

## 2012-01-22 LAB — POTASSIUM: Potassium: 5.7 mEq/L — ABNORMAL HIGH (ref 3.5–5.1)

## 2012-01-22 LAB — HEPATIC FUNCTION PANEL
ALT: 76 U/L — ABNORMAL HIGH (ref 0–53)
AST: 74 U/L — ABNORMAL HIGH (ref 0–37)
Alkaline Phosphatase: 103 U/L (ref 39–117)
Bilirubin, Direct: 0.1 mg/dL (ref 0.0–0.3)
Total Bilirubin: 0.3 mg/dL (ref 0.3–1.2)

## 2012-01-22 LAB — GLUCOSE, CAPILLARY
Glucose-Capillary: 240 mg/dL — ABNORMAL HIGH (ref 70–99)
Glucose-Capillary: 220 mg/dL — ABNORMAL HIGH (ref 70–99)

## 2012-01-22 MED ORDER — INSULIN ASPART 100 UNIT/ML ~~LOC~~ SOLN
0.0000 [IU] | Freq: Three times a day (TID) | SUBCUTANEOUS | Status: DC
  Administered 2012-01-22: 11 [IU] via SUBCUTANEOUS
  Administered 2012-01-22: 7 [IU] via SUBCUTANEOUS
  Administered 2012-01-23: 11 [IU] via SUBCUTANEOUS

## 2012-01-22 MED ORDER — OXYCODONE HCL 5 MG PO TABS
5.0000 mg | ORAL_TABLET | ORAL | Status: DC | PRN
  Administered 2012-01-22: 5 mg via ORAL
  Administered 2012-01-22: 10 mg via ORAL
  Filled 2012-01-22: qty 1
  Filled 2012-01-22 (×2): qty 2

## 2012-01-22 MED ORDER — SODIUM CHLORIDE 0.45 % IV SOLN
INTRAVENOUS | Status: DC
  Administered 2012-01-22 – 2012-01-23 (×3): via INTRAVENOUS

## 2012-01-22 MED ORDER — PNEUMOCOCCAL VAC POLYVALENT 25 MCG/0.5ML IJ INJ
0.5000 mL | INJECTION | INTRAMUSCULAR | Status: DC
  Filled 2012-01-22: qty 0.5

## 2012-01-22 MED ORDER — SODIUM POLYSTYRENE SULFONATE 15 GM/60ML PO SUSP
30.0000 g | Freq: Once | ORAL | Status: AC
  Administered 2012-01-22: 30 g via RECTAL
  Filled 2012-01-22: qty 120

## 2012-01-22 MED ORDER — INSULIN GLARGINE 100 UNIT/ML ~~LOC~~ SOLN
5.0000 [IU] | Freq: Every day | SUBCUTANEOUS | Status: DC
  Administered 2012-01-22: 5 [IU] via SUBCUTANEOUS

## 2012-01-22 MED ORDER — MORPHINE SULFATE 2 MG/ML IJ SOLN: 1.0000 mg | INTRAMUSCULAR | Status: DC | PRN

## 2012-01-22 NOTE — Progress Notes (Signed)
Inpatient Diabetes Program Recommendations  AACE/ADA: New Consensus Statement on Inpatient Glycemic Control (2013)  Target Ranges:  Prepandial:   less than 140 mg/dL      Peak postprandial:   less than 180 mg/dL (1-2 hours)      Critically ill patients:  140 - 180 mg/dL   Reason for Visit: Hyperglycemia  Inpatient Diabetes Program Recommendations Insulin - Basal: Since last night, CBG's have been consistently in the 200's despite 7 to 11 units units coverage with CBG checks.  Please consider starting Lantus 20 to 25 units at HS with titration based on before breakfast CBG to help improve glycemic control while in the hospital setting. Insulin - Meal Coverage: Home insulin regimen was Regular insulin 10 units tid HgbA1C: No known A1C.  Request MD order. Diet: On clear liquid diet.  Note: Potassium elevated.  Received kayexalate today.  Results for QUANTEL, MCINTURFF (MRN [id number]) as of 01/22/2012 13:26  Ref. Range 01/21/2012 18:21 01/21/2012 20:31 01/22/2012 00:01 01/22/2012 03:51 01/22/2012 07:52 01/22/2012 11:43  Glucose-Capillary Latest Range: 70-99 mg/dL 086 (H) 578 (H) 469 (H) 253 (H) 207 (H) 240 (H)   Thank you.  Latrese Carolan S. Elsie Lincoln, RN, CNS, CDE Inpatient Diabetes Program, team pager [phone]

## 2012-01-22 NOTE — Progress Notes (Signed)
CRITICAL VALUE ALERT  Critical value received:  K+ - 6.3  Date of notification:  01/22/2012  Time of notification:  0824  Critical value read back:yes  Nurse who received alert:  Jacqualine Code, RN  MD notified (1st page):  Dr. Johna Sheriff  Time of first page:  0828  MD notified (2nd page): Dr. [name]    Time of second page: [phone]  Responding MD:  Dr. Johna Sheriff  Time MD responded:  0830  Responding MD: Dr. [name]  Time MD responded: [phone]

## 2012-01-22 NOTE — Progress Notes (Signed)
1 Day Post-Op  Subjective: Had a little nausea this am but better. Tolerated liquids. Hasn't ambulate yet. Pain controlled. Has about 500cc urine in foley  Objective: Vital signs in last 24 hours: Temp:  [97.5 F (36.4 C)-98.4 F (36.9 C)] 98.2 F (36.8 C) (12/17 0944) Pulse Rate:  [59-92] 92  (12/17 0944) Resp:  [11-30] 25  (12/17 0944) BP: (122-175)/(50-69) 150/59 mmHg (12/17 0944) SpO2:  [90 %-98 %] 95 % (12/17 0944) Last BM Date: 01/21/12  Intake/Output from previous day: 12/16 0701 - 12/17 0700 In: 3284 [I.V.:3284] Out: 880 [Urine:450; Drains:130; Blood:300] Intake/Output this shift: Total I/O In: 240 [P.O.:240] Out: -   Alert, nad cta b/l Reg Obese, soft, +BS, dressing c/d/i. Drain - serosang No edema  Lab Results:   Basename 01/22/12 0635  WBC 22.7*  HGB 12.9*  HCT 40.8  PLT 239   BMET  Basename 01/22/12 0635  NA 134*  K 6.3*  CL 101  CO2 23  GLUCOSE 218*  BUN 30*  CREATININE 2.09*  CALCIUM 8.4   PT/INR No results found for this basename: LABPROT:2,INR:2 in the last 72 hours ABG No results found for this basename: PHART:2,PCO2:2,PO2:2,HCO3:2 in the last 72 hours  Studies/Results: Dg Cholangiogram Operative  01/21/2012  *RADIOLOGY REPORT*  Clinical Data:   61 year old male undergoing laparoscopic cholecystectomy.  INTRAOPERATIVE CHOLANGIOGRAM  Technique:  Cholangiographic images from the C-arm fluoroscopic device were submitted for interpretation post-operatively.  Please see the procedural report for the amount of contrast and the fluoroscopy time utilized.  Comparison:  ERCP images 10/24/2011.  Findings:  Two images following contrast injection of biliary tree. Surgical clips at the level of the cystic duct.  Mildly dilated intra and extrahepatic biliary tree.  Mild irregularity at the distal CBD, but small volume of contrast in the duodenum.  No filling defect identified.  IMPRESSION: Mild biliary ductal dilatation and irregularity at the distal  CBD, but contrast appears to reach the duodenum and no filling defect or extravasation is identified.   Original Report Authenticated By: Erskine Speed, M.D.     Anti-infectives: Anti-infectives     Start     Dose/Rate Route Frequency Ordered Stop   01/21/12 0600   cefOXitin (MEFOXIN) 3 g in dextrose 5 % 50 mL IVPB  Status:  Discontinued        3 g 100 mL/hr over 30 Minutes Intravenous On call to O.R. 01/20/12 1301 01/20/12 1304   01/21/12 0600   cefOXitin (MEFOXIN) 2 g in dextrose 5 % 50 mL IVPB        2 g 100 mL/hr over 30 Minutes Intravenous On call to O.R. 01/20/12 1307 01/21/12 1355          Assessment/Plan: s/p Procedure(s) (LRB) with comments: CHOLECYSTECTOMY (N/A) LAPAROSCOPIC CHOLECYSTECTOMY (N/A) - attempted laparoscopic cholecystectomy INTRAOPERATIVE CHOLANGIOGRAM (N/A)  Hyperkalemia - d/c KCL from ivf. Kayexalate; repeat potassium level later today VTE prophy - scds, lovenox Adv diet as tolerated Renal - good uop. Has baseline renal insufficiency. Will hold acei given small bump in Cr. D/c foley HTN - cont home medications DM - cont SSI, add basal insulin Drain teaching.   Justin Bartlett. Justin Campanile, MD, FACS General, Bariatric, & Minimally Invasive Surgery [facility] Surgery, [place]   LOS: 1 day    Justin Bartlett 01/22/2012

## 2012-01-22 NOTE — Op Note (Signed)
NAMEMERCED, HANNERS NO.:  [id number]  MEDICAL RECORD NO.:  [id number]  LOCATION:  6N29C                        FACILITY:  [facility]  PHYSICIAN:  [name]. [name], MD, FACSDATE OF BIRTH:  Aug 04, 1950  DATE OF PROCEDURE:  01/21/2012 DATE OF DISCHARGE:                              OPERATIVE REPORT   PREOPERATIVE DIAGNOSES:  Cholelithiasis, history of common bile duct stones.  POSTOPERATIVE DIAGNOSES:  Cholelithiasis, history of common bile duct stones.  PROCEDURE:  Laparoscopic cholecystectomy converted to open cholecystectomy with intraoperative cholangiogram.  SURGEON:  [name]. [name], MD, FACS  ASSISTANT SURGEON: 1. Lodema Pilot, MD 2. Abigail Miyamoto, M.D.  ANESTHESIA:  General.  SPECIMEN:  Part of gallbladder.  EBL:  300 mL.  DRAINS:  19-French Jackson-Pratt in Morison's pouch.  LOCAL:  0.25% Marcaine with epinephrine.  FINDINGS:  The 89 modifier should be applied to this case because of the length of time to do the procedure.  The patient is morbidly obese with a large body habitus, making it one challenging to get up to the gallbladder and then his anatomy near the infundibulum was unclear, requiring [place] to convert to an open procedure.  His body habitus and anatomy added an extra 2 hours of the case.  INDICATIONS FOR PROCEDURE:  The patient is a very pleasant 61 year old Caucasian male who had evidence of common bile duct stone early in the year.  He underwent three ERCPs over the summer and early fall in order to clear his common bile duct.  I saw him and did discuss elective cholecystectomy in order to prevent recurrence.  We discussed risks and benefits, including but not limited to, bleeding, infection, injury to surrounding structures, need to convert to an open procedure, prolonged diarrhea, injury to the common bile duct, blood clot formation, anesthesia risk, incisional hernia, bile leak as well as the typical postoperative course.  I  explained that he was slightly higher risk for complications given his morbid obesity, and the fact that was on an oral anticoagulant for his coronary artery disease.  He elects to proceed with surgery.  DESCRIPTION OF PROCEDURE:  After obtaining informed consent, he was taken to the operating room #2 at [facility].  He was placed supine on the operating table.  General endotracheal anesthesia was established.  Sequential compression devices were placed.  His abdomen was prepped and draped in usual standard surgical fashion with ChloraPrep.  A surgical time-out was performed.  He received 2 g of cefoxitin prior to skin incision.  A supraumbilical incision was made with 11-blade after local had been infiltrated.  The patient had a very obese abdomen and had a long torso.  Therefore, I went supraumbilically. The fascia was grasped and lifted anteriorly with Kochers.  The fascia was incised with #11 blade.  The abdominal cavity was entered.  A pursestring suture consisting of 0 Vicryl on a UR6 needle was placed around the fascial edges.  Next, a 12-mm Hasson trocar was placed,  pneumoperitoneum was smoothly established up to the patient pressure of 15 mmHg.  Laparoscope was advanced and the abdominal cavity was surveilled.  There was no evidence of injury to surrounding structures.  The patient had a very high riding liver.  It was several inches underneath his right rib cage.  He was then placed in reverse Trendelenburg and rotated him to the left.  Three 5-mm trocars were placed, one in the subxiphoid and two in the right hypochondrium, all under direct visualization after local had been infiltrated.  At this point, Dr. Magnus Ivan joined me in the operating room.  The gallbladder was grasped and retracted as much as we could, towards the right shoulder.  It was somewhat difficult to get good retraction because of his fatty liver and how high up his liver was.  There was a  lot of fat around the gallbladder taking care to stay high on the gallbladder.  We incised the fat both medially and laterally with hook electrocautery.  It became quite clear early on that the patient had a very intrahepatic gallbladder.  Once we had cleared off some of surrounding fat, I actually had to add another 5-mm trocar in the midline in order to aid with retraction of the transverse colon and bowel down out of the way while Dr. Magnus Ivan could use his other hand to retract the neck of the body and neck of the gallbladder.  We started taking down the overlying peritoneum with the blunt dissector as well as with the suction irrigator catheter.  At this point, I still had not been able to identify any structures such as the cystic duct or the cystic artery.  I was able to identify a small branch of what appeared to be the cystic artery entering the gallbladder.  This was isolated with the [facility].  We identified what appeared to be the neck of the gallbladder going into the infundibulum.  I tried to get around it with the [place] as well as with the right angle.  We attempted to get around it both medially and laterally, but I could just not get a good plane.  I went little bit higher up on the gallbladder and started dissecting again, but the gallbladder was entered and it was clear that I was still in the gallbladder as we could see the gallbladder lumen up in the body.  The neck of the gallbladder appeared to have split it in half after I dissected around this area with the [place] as well as with the right angle.  I attempted to thread a cholangiogram catheter into the infundibulum and neck of the gallbladder.  At this point, it was separated from the remaining part of the gallbladder.  I was unable to really secure the catheter without it leaking saline.  At this point, because of the unclear anatomy, I elected to convert to an open procedure. The three 5-mm trocars  in the subxiphoid and the right hypochondrium were removed and trocar incisions were connected with a single incision with the 10 blade.  The subcutaneous tissue was divided with electrocautery.  The abdominal muscles were split with Bovie electrocautery.  The peritoneum was incised and completely opened.  At this point, a Bookwalter retractor was placed, it was still even with placing Bookwalter and Bodywall retractors somewhat challenging visually.  We placed several lap pads above the right lobe of the liver, which brought down the liver.  Some of the Bodywall retractors just were not deep enough to adequately expose this area.  At this point, Dr. Biagio Quint joined me in the operating room and Dr. Magnus Ivan left.  We decided to do a dome down approach.  At this  point, I started mobilizing the dome of the gallbladder down from the liver.  However, it became quite evident that the posterior wall of the gallbladder was just fused intrahepatically.  Therefore, I left the posterior wall against the liver.  There was some bleeding from the liver bed and this was managed with electrocautery as well as a surgical snow.  We had left the cholangiogram catheter in the infundibulum of the gallbladder before we converted to an open procedure.  We left it in place.  The remaining part of the gallbladder was freed from the liver.  The cystic artery was identified and two clips were placed on it and then it was ligated distally as it entered the gallbladder.  Portion of the back wall of the gallbladder was left in with the patient and the remaining part of the gallbladder was removed.  We need up removing this Cook cholangiogram catheter and obtaining a Reddick catheter.  It took multiple attempts, but we are able to identify the direction of the cystic duct.  The Reddick catheter was threaded within it.  When we blew up the balloon, it popped out.  We deflated the balloon and re-advanced the catheter and  before blowing up the balloon, we placed a 2-0 silk suture above and below the catheter and I clipped to help secure the catheter, so it would not become dislodged. The balloon was gently inflated, and it blew up without any resistance and it flushed easily and there was no spillage of saline.  We then obtained the cholangiogram, which demonstrated a prompt opacification of the cystic duct, common hepatic, left and right ducts as well as the common bile duct that emptying into the duodenum.  It appeared that the catheter was within the cystic duct, which was still quite long.  It did not appear that I had entered the common bile duct.  C- arm was removed.  We reestablished our retraction.  The silk sutures were removed and the Reddick catheter was removed.  Even though there was still a long cystic duct left, I felt dissecting any further down would be not in the patient's best interest since I could not delineate between the cystic duct and the common bile duct intra-abdominally visually.  Therefore, we closed the cystic duct with interrupted 4-0 Prolene sutures.  It appeared to be watertight.  The gallbladder fossa was irrigated with saline copiously.  Hemostasis was achieved.  A 19-French Blake drain was placed and Morison's pouch and brought out through the right lower quadrant, secured to the skin with 2- 0 nylon.  The peritoneum with all laps were removed from the abdominal cavity.  We confirmed our lap count was correct.  I tied down the previously placed pursestring suture at the umbilicus and I confirmed closure intra-abdominally.  We then closed the peritoneum with two running looped PDS, one from medially and one from laterally.  The anterior fascia was then closed in a similar fashion with two looped PDS.  Subcutaneous tissue was irrigated.  The skin was reapproximated with surgical skin staples and Telfa wicks were placed.  Surgical staples were also placed at the two  remaining trocar sites.  Sterile dressing was applied.  The patient was in and out catheterized at the end of the procedure.  He was extubated and taken to the recovery room in stable condition.  All needle, instrument, and sponge counts were correct.     [name]. [name], MD, FACS     EMW/MEDQ  D:  01/21/2012  T:  01/22/2012  Job:  [id number]  cc:   Willis Modena, MD

## 2012-01-23 LAB — CBC
HCT: 37.9 % — ABNORMAL LOW (ref 39.0–52.0)
Hemoglobin: 12 g/dL — ABNORMAL LOW (ref 13.0–17.0)
MCV: 87.1 fL (ref 78.0–100.0)
Platelets: 192 10*3/uL (ref 150–400)
RBC: 4.35 MIL/uL (ref 4.22–5.81)
WBC: 19.7 10*3/uL — ABNORMAL HIGH (ref 4.0–10.5)

## 2012-01-23 LAB — GLUCOSE, CAPILLARY: Glucose-Capillary: 267 mg/dL — ABNORMAL HIGH (ref 70–99)

## 2012-01-23 LAB — COMPREHENSIVE METABOLIC PANEL
Albumin: 2.6 g/dL — ABNORMAL LOW (ref 3.5–5.2)
BUN: 26 mg/dL — ABNORMAL HIGH (ref 6–23)
Calcium: 8.7 mg/dL (ref 8.4–10.5)
Creatinine, Ser: 1.73 mg/dL — ABNORMAL HIGH (ref 0.50–1.35)
Total Protein: 6.3 g/dL (ref 6.0–8.3)

## 2012-01-23 MED ORDER — OXYCODONE HCL 5 MG PO TABS: 5.0000 mg | ORAL_TABLET | ORAL | Status: DC | PRN

## 2012-01-23 NOTE — Discharge Summary (Signed)
Physician Discharge Summary  Justin Bartlett JYN:[id number] DOB: 1950/03/10 DOA: 01/21/2012  PCP: Thora Lance, MD  Admit date: 01/21/2012 Discharge date: 01/23/2012  Recommendations for Outpatient Follow-up:   Follow-up Information    Follow up with [facility]. Schedule an appointment as soon as possible for a visit in 1 week. (for drain removal)       Follow up with Atilano Ina, MD,FACS. Schedule an appointment as soon as possible for a visit in 2 weeks.   Contact information:   [address] [place] [address] [phone]         Discharge Diagnoses:  1. Symptomatic cholelithiasis, chronic cholecystitis, h/o CBD stone 2. Morbid obesity 3. DM 4. HTN 5. Hyperkalemia - resolved 6. Chronic Kidney disease  Surgical Procedure: laparoscopic converted to open cholecystectomy with intraoperative cholangiogram  Discharge Condition: good Disposition: to home with surgical drain  Diet recommendation: diabetic diet  Filed Weights   01/22/12 1700  Weight: 279 lb 6.4 oz (126.735 kg)    Hospital Course:  61 year old Caucasian male admitted for planned laparoscopic cholecystectomy with cholangiogram for a history of symptomatic cholelithiasis as well as common bile duct stones requiring multiple ERCPs. He ended up having an open cholecystectomy with intraoperative cholangiogram. Please see operative note for further details. A surgical drain was left in the gallbladder fossa. His postoperative course was unremarkable. He was given a clear liquid diet which he tolerated. He was advanced to a solid ADA diet on postoperative day one at night. His vital signs remained stable. On day of discharge, he was tolerating a solid food diet. He had no nausea or vomiting. He was ambulating without difficulty. His vital signs are stable. His pain was well-controlled. He had been instructed on drain care. His creatinine had normalized to his baseline of around 1.7. The  patient had been instructed on drain care and to monitor for signs of a bile leak  BP 150/60  Pulse 71  Temp 98.2 F (36.8 C) (Oral)  Resp 18  Ht 5\' 10"  (1.778 m)  Wt 279 lb 6.4 oz (126.735 kg)  BMI 40.09 kg/m2  SpO2 90% Alert, no apparent stress Lungs are clear to auscultation Regular rate and rhythm Soft, obese, incision-clean dry and intact, drain-serous sanguinous. Mild incisional tenderness No edema   Discharge Instructions  Discharge Orders    Future Appointments: Provider: Department: Dept Phone: Center:   01/28/2012 9:30 AM [facility], [place] [phone] None   02/08/2012 1:30 PM Atilano Ina, MD,FACS [facility] Surgery, [place] [phone] None   03/26/2012 11:45 AM Cassell Clement, MD [facility] Main Office [place]) [phone] [id number]     Future Orders Please Complete By Expires   Diet Carb Modified      Increase activity slowly      Discharge instructions      Comments:   See [facility] discharge instructions       Medication List     As of 01/23/2012 11:38 AM    TAKE these medications         ACCU-CHEK AVIVA PLUS test strip   Generic drug: glucose blood   as directed.      diltiazem 240 MG 24 hr capsule   Commonly known as: CARDIZEM CD   Take 240 mg by mouth daily.      dipyridamole-aspirin 200-25 MG per 12 hr capsule   Commonly known as: AGGRENOX   Take 1 capsule by mouth 2 (two) times  daily.      ezetimibe-simvastatin 10-80 MG per tablet   Commonly known as: VYTORIN   Take 1 tablet by mouth at bedtime.      insulin regular 100 units/mL injection   Commonly known as: NOVOLIN R,HUMULIN R   Inject 10 Units into the skin 3 (three) times daily before meals.      INSULIN SYRINGE .3CC/29GX1/2" 29G X 1/2" 0.3 ML Misc   Inject as directed as directed.      itraconazole 10 MG/ML solution   Commonly known as: SPORANOX   Apply 200 mg topically daily. Apply to all toenails daily.      metoprolol 100 MG  tablet   Commonly known as: LOPRESSOR   Take 100 mg by mouth 2 (two) times daily.      omeprazole 20 MG capsule   Commonly known as: PRILOSEC   Take 20 mg by mouth daily.      oxyCODONE 5 MG immediate release tablet   Commonly known as: Oxy IR/ROXICODONE   Take 1-2 tablets (5-10 mg total) by mouth every 4 (four) hours as needed.      ramipril 10 MG tablet   Commonly known as: ALTACE   Take 10 mg by mouth daily.           Follow-up Information    Follow up with [facility]. Schedule an appointment as soon as possible for a visit in 1 week. (for drain removal)       Follow up with Atilano Ina, MD,FACS. Schedule an appointment as soon as possible for a visit in 2 weeks.   Contact information:   [address] [place] [address] [phone]           The results of significant diagnostics from this hospitalization (including imaging, microbiology, ancillary and laboratory) are listed below for reference.    Significant Diagnostic Studies: Dg Chest 2 View  01/16/2012  *RADIOLOGY REPORT*  Clinical Data: coronary artery disease and hypertension  CHEST - 2 VIEW  Comparison: 05/28/2007  Findings: Previous median sternotomy CABG procedure.  No pleural effusion or edema.  There is asymmetric elevation of the right hemidiaphragm.  This is similar to previous exam.  No airspace consolidation identified.  IMPRESSION:  1.  No acute cardiopulmonary abnormalities.   Original Report Authenticated By: Signa Kell, M.D.    Dg Cholangiogram Operative  01/21/2012  *RADIOLOGY REPORT*  Clinical Data:   61 year old male undergoing laparoscopic cholecystectomy.  INTRAOPERATIVE CHOLANGIOGRAM  Technique:  Cholangiographic images from the C-arm fluoroscopic device were submitted for interpretation post-operatively.  Please see the procedural report for the amount of contrast and the fluoroscopy time utilized.  Comparison:  ERCP images 10/24/2011.  Findings:  Two images  following contrast injection of biliary tree. Surgical clips at the level of the cystic duct.  Mildly dilated intra and extrahepatic biliary tree.  Mild irregularity at the distal CBD, but small volume of contrast in the duodenum.  No filling defect identified.  IMPRESSION: Mild biliary ductal dilatation and irregularity at the distal CBD, but contrast appears to reach the duodenum and no filling defect or extravasation is identified.   Original Report Authenticated By: Erskine Speed, M.D.     Microbiology: Recent Results (from the past 240 hour(s))  SURGICAL PCR SCREEN     Status: Abnormal   Collection Time   01/16/12  8:46 AM      Component Value Range Status Comment   MRSA, PCR NEGATIVE  NEGATIVE  Final    Staphylococcus aureus POSITIVE (*) NEGATIVE Final      Labs: Basic Metabolic Panel:  Lab 01/23/12 1610 01/22/12 1230 01/22/12 0635  NA 128* -- 134*  K 5.1 5.7* 6.3*  CL 95* -- 101  CO2 21 -- 23  GLUCOSE 255* -- 218*  BUN 26* -- 30*  CREATININE 1.73* -- 2.09*  CALCIUM 8.7 -- 8.4  MG -- -- --  PHOS -- -- --   Liver Function Tests:  Lab 01/23/12 0530 01/22/12 0635  AST 46* 74*  ALT 55* 76*  ALKPHOS 105 103  BILITOT 0.4 0.3  PROT 6.3 6.3  ALBUMIN 2.6* 2.8*   CBC:  Lab 01/23/12 0530 01/22/12 0635  WBC 19.7* 22.7*  NEUTROABS -- --  HGB 12.0* 12.9*  HCT 37.9* 40.8  MCV 87.1 88.1  PLT 192 239   CBG:  Lab 01/23/12 0805 01/22/12 2155 01/22/12 1712 01/22/12 1143 01/22/12 0752  GLUCAP 267* 220* 277* 240* 207*    Active Problems:  Benign hypertensive heart disease without heart failure  Diabetes mellitus  Exogenous obesity  Cholelithiases   Time coordinating discharge: 10 minutes  Signed:  Atilano Ina, MD [facility] [place] [facility] Surgery, [place] [phone] 01/23/2012, 11:38 AM

## 2012-01-23 NOTE — Progress Notes (Signed)
Patient discharged to home with family. Discharge teaching completed including follow up care, medications, signs and symptoms of infection.  Patient correctly demonstrated emptying and care of JP drain.  Verbalizes no further questions.  Vital signs stable, no complaints of pain.  Discharged per wheelchair with family.

## 2012-01-28 ENCOUNTER — Encounter (INDEPENDENT_AMBULATORY_CARE_PROVIDER_SITE_OTHER): Payer: Self-pay

## 2012-01-28 ENCOUNTER — Ambulatory Visit (INDEPENDENT_AMBULATORY_CARE_PROVIDER_SITE_OTHER): Payer: BC Managed Care – PPO | Admitting: General Surgery

## 2012-01-28 VITALS — BP 136/70 | HR 76 | Temp 97.8°F | Resp 18 | Ht 70.0 in | Wt 276.5 lb

## 2012-01-28 DIAGNOSIS — Z4802 Encounter for removal of sutures: Secondary | ICD-10-CM

## 2012-01-28 NOTE — Progress Notes (Unsigned)
Patient presented today for nurse only visit for staple removal and drain check. Based on the listing provided by the patient his drainage today was at 28 cc. Patient advised drainage will remain until next nurse visit on 02/04/12 at 9:30. The area around the drainage site shows slight redness. However the patient did not complain of any pain at the site. No signs of infection or drainage or odor at the drain or incision sites. 17 staples were removed and 11 steri strips applied. The incision sites appear well healed. Justin Nora, RN came into the room to check the patient and agreed his wounds are healing well. Patient advised to allow the steri strips to come off on their own and to keep the appointment to see Dr. [name] on 02/08/11. Patient to come back on 02/04/12 for drain check.

## 2012-01-28 NOTE — Patient Instructions (Signed)
Your incision sites appear to be healing well. The drain however will remain due to the amount of drainage as of today's nurse visit of 28 cc. Please make sure to continue recording how much drainage you have daily until it can be removed. You have been scheduled to come back to the office for a nurse visit on 02/04/12 at 9:30. The steri strips that have been applied will come off on their own. Please make sure to use antibacterial soap when you shower and do not rub the incision site.   Please keep your appointment to see Dr. [name] on 02/08/11.

## 2012-02-04 ENCOUNTER — Ambulatory Visit (INDEPENDENT_AMBULATORY_CARE_PROVIDER_SITE_OTHER): Payer: BC Managed Care – PPO | Admitting: General Surgery

## 2012-02-04 DIAGNOSIS — Z4803 Encounter for change or removal of drains: Secondary | ICD-10-CM

## 2012-02-04 DIAGNOSIS — Z4889 Encounter for other specified surgical aftercare: Secondary | ICD-10-CM

## 2012-02-04 NOTE — Patient Instructions (Signed)
Call if redness around incision spreads. Clean daily and cover with dry gauze.

## 2012-02-04 NOTE — Progress Notes (Signed)
Patient comes in today status post open chole on 01/21/12. He comes in for drain removal. His incision is weeping some yellow/serosanginous fluid and has a small amount of redness. Dr Abbey Chatters evaluated the wound and advised it is okay to watch. TO keep clean and dry and covered with dry gauze. Patient instructed of this and to call if redness spreads. He agrees. Patient's drain had drained approximately 25 cc over the last 37 hours and 25 cc the 34 hours prior to that. His drain is ready for removal. The stitch was clipped and suction taken off. Drain was removed without difficulty and drain site covered with gauze/triple antibiotic. Patient to keep appt with Dr [name] on Friday.

## 2012-02-08 ENCOUNTER — Ambulatory Visit (INDEPENDENT_AMBULATORY_CARE_PROVIDER_SITE_OTHER): Payer: BC Managed Care – PPO | Admitting: General Surgery

## 2012-02-08 ENCOUNTER — Encounter (INDEPENDENT_AMBULATORY_CARE_PROVIDER_SITE_OTHER): Payer: Self-pay | Admitting: General Surgery

## 2012-02-08 VITALS — BP 123/77 | HR 83 | Temp 98.1°F | Resp 12 | Ht 70.0 in | Wt 283.0 lb

## 2012-02-08 DIAGNOSIS — Z09 Encounter for follow-up examination after completed treatment for conditions other than malignant neoplasm: Secondary | ICD-10-CM

## 2012-02-08 NOTE — Patient Instructions (Signed)
Can resume full activities the first week of Feb

## 2012-02-08 NOTE — Progress Notes (Signed)
Subjective:     Patient ID: Justin Bartlett, male   DOB: 09/10/1950, 62 y.o.   MRN: [id number]  HPI 62 year old Caucasian male comes in for followup after undergoing laparoscopic converted to open cholecystectomy on December 16 for a history of choledocholithiasis requiring multiple ERCPs and cholelithiasis. Please see the operative note for further details. It was a rather challenging cholecystectomy. However we were able to do an interoperative cholangiogram which demonstrated no extravasation as well as no significant filling defect. He was seen in the office on December 30 and his surgical drain and surgical skin staples were removed. There was apparently one area of erythema along the midportion of the incision that was noticed. He denies any fever, chills, nausea, vomiting, diarrhea or constipation. He states he's had a little bit of drainage from his incision; however, the redness has resolved. He states he's already gone back to work. He states that he has some occasional soreness in his right upper quadrant but it is getting better. He reports a good appetite.  Review of Systems     Objective:   Physical Exam BP 123/77  Pulse 83  Temp 98.1 F (36.7 C) (Temporal)  Resp 12  Ht 5\' 10"  (1.778 m)  Wt 283 lb (128.368 kg)  BMI 40.61 kg/m2  Gen: alert, NAD, non-toxic appearing Pupils: equal, no scleral icterus, +injected Pulm: Lungs clear to auscultation, symmetric chest rise CV: regular rate and rhythm Abd: soft, nontender, nondistended. Obese; Well-healed trocar sites. No cellulitis. No incisional hernia. Right subcostal incision is for the most part healed. There is one small area of about 1/2 cm in length where the skin is separated. I cannot express any drainage. Ext: no edema, no calf tenderness Skin: no rash, no jaundice     Assessment:     S/p laparoscopic cholecystectomy converted to open cholecystectomy with IOC    Plan:     We reviewed his pathology report which showed  portions of gallbladder with chronic and active inflammation. He appears to be doing well from surgery. I would like to keep an eye on that one small area of his incision. I think it will close up by secondary intention. He was encouraged to keep a dry gauze over the area. He was reminded that he should not do any heavy lifting until the last week of January. I will see him in 8 weeks for a wound check  [name]. [name], MD, FACS General, Bariatric, & Minimally Invasive Surgery [facility] Surgery, [place]

## 2012-02-26 ENCOUNTER — Telehealth (INDEPENDENT_AMBULATORY_CARE_PROVIDER_SITE_OTHER): Payer: Self-pay | Admitting: General Surgery

## 2012-02-26 NOTE — Telephone Encounter (Signed)
Pt called to discuss the ongoing drainage from his surgical wound.  He describes serosanguinous fluid only.  It is odorless and the skin appears healthy.  Reassured the pt that his wound is healing from the inside out, which is not uncommon.  He will continue to clean the wound with soapy water and rinse well, pat dry and cover with absorbant bandage.  He will call back for new redness or odor or purulence from the wound.

## 2012-03-18 ENCOUNTER — Telehealth (INDEPENDENT_AMBULATORY_CARE_PROVIDER_SITE_OTHER): Payer: Self-pay | Admitting: General Surgery

## 2012-03-18 NOTE — Telephone Encounter (Signed)
Message copied by Liliana Cline on Tue Mar 18, 2012  1:33 PM ------      Message from: Marchia Bond      Created: Tue Mar 18, 2012 11:58 AM      Regarding: needs appt      Contact: [phone]       Dr [name] pt R/S due to snow and needs to r/s for sooner than march call back at [phone]       ------

## 2012-03-18 NOTE — Telephone Encounter (Signed)
Appt rescheduled

## 2012-03-20 ENCOUNTER — Encounter (INDEPENDENT_AMBULATORY_CARE_PROVIDER_SITE_OTHER): Payer: BC Managed Care – PPO | Admitting: General Surgery

## 2012-03-26 ENCOUNTER — Encounter: Payer: Self-pay | Admitting: Cardiology

## 2012-03-26 ENCOUNTER — Ambulatory Visit (INDEPENDENT_AMBULATORY_CARE_PROVIDER_SITE_OTHER): Payer: BC Managed Care – PPO | Admitting: Cardiology

## 2012-03-26 VITALS — BP 132/74 | HR 64 | Ht 70.5 in | Wt 273.4 lb

## 2012-03-26 DIAGNOSIS — I259 Chronic ischemic heart disease, unspecified: Secondary | ICD-10-CM

## 2012-03-26 DIAGNOSIS — R0989 Other specified symptoms and signs involving the circulatory and respiratory systems: Secondary | ICD-10-CM

## 2012-03-26 DIAGNOSIS — I119 Hypertensive heart disease without heart failure: Secondary | ICD-10-CM

## 2012-03-26 NOTE — Patient Instructions (Addendum)
Your physician has requested that you have a carotid duplex. This test is an ultrasound of the carotid arteries in your neck. It looks at blood flow through these arteries that supply the brain with blood. Allow one hour for this exam. There are no restrictions or special instructions.  Your physician recommends that you continue on your current medications as directed. Please refer to the Current Medication list given to you today.  Your physician wants you to follow-up in: 6 months. You will receive a reminder letter in the mail two months in advance. If you don't receive a letter, please call our office to schedule the follow-up appointment.  

## 2012-03-26 NOTE — Assessment & Plan Note (Signed)
On exam today the patient has a medium pitched left carotid bruit.  He has not been experiencing any TIA symptoms.  He gives a remote history of a mild stroke in 2005.  My previous notes do not indicate any bruit being present.  This carotid bruit may be originating at the takeoff of the common carotid from the aorta.  We will get carotid Dopplers to evaluate further.

## 2012-03-26 NOTE — Assessment & Plan Note (Signed)
The patient has had no recurrent angina pectoris or chest pains.

## 2012-03-26 NOTE — Assessment & Plan Note (Signed)
The patient has not had any symptoms of congestive heart failure.  He does have chronic lower extremity edema which has improved since he started wearing support stockings.  He is making a good effort to limit dietary salt.

## 2012-03-26 NOTE — Progress Notes (Signed)
Justin Bartlett Date of Birth:  09-Nov-1950 [facility] [address] [place], [place]  [address] [phone]         Fax   [phone]  History of Present Illness: This pleasant 62 year old gentleman is seen for a six-month followup office visit. He has a history of known ischemic heart disease. He had an anteroseptal myocardial infarction in 2001 and underwent coronary artery bypass graft surgery at that time. His last nuclear stress test 06/23/08 showed an old apical infarct with minimal reversible ischemia his ejection fraction was 41%. The patient has a history of hypertension, diabetes, and exogenous obesity. He is also followed by Dr. Luciana Axe for retinal disease.  Since we last saw him he has had more problems with his gallbladder and about 2 weeks ago underwent cholecystectomy by Dr. Gaynelle Adu.  Prior to that Dr. Dulce Sellar had done several endoscopic procedures to extract a common duct stone.  The patient returned to work in January but is on limited activity and has not yet been allowed to resume his physical workouts.  Since last visit he has gained 6 pounds.   Current Outpatient Prescriptions  Medication Sig Dispense Refill  . ACCU-CHEK AVIVA PLUS test strip as directed.      . diltiazem (CARDIZEM CD) 240 MG 24 hr capsule Take 240 mg by mouth daily.      Marland Kitchen dipyridamole-aspirin (AGGRENOX) 200-25 MG per 12 hr capsule Take 1 capsule by mouth 2 (two) times daily.      Marland Kitchen ezetimibe-simvastatin (VYTORIN) 10-80 MG per tablet Take 1 tablet by mouth at bedtime.        . insulin regular (HUMULIN R,NOVOLIN R) 100 units/mL injection Inject 10 Units into the skin 3 (three) times daily before meals.       . Insulin Syringe-Needle U-100 (INSULIN SYRINGE .3CC/29GX1/2") 29G X 1/2" 0.3 ML MISC Inject as directed as directed.      . itraconazole (SPORANOX) 10 MG/ML solution Apply 200 mg topically daily. Apply to all toenails daily.      . metoprolol (LOPRESSOR) 100 MG tablet Take 100 mg  by mouth 2 (two) times daily.       Marland Kitchen omeprazole (PRILOSEC) 20 MG capsule Take 20 mg by mouth 2 (two) times daily.       . ramipril (ALTACE) 10 MG tablet Take 10 mg by mouth daily.        No current facility-administered medications for this visit.    Allergies  Allergen Reactions  . Actos (Pioglitazone)     Elevated kidney function  . Hctz (Hydrochlorothiazide) Other (See Comments)    BP issues.     Patient Active Problem List  Diagnosis  . Ischemic heart disease  . Benign hypertensive heart disease without heart failure  . Diabetes mellitus  . Exogenous obesity  . Hypercholesterolemia  . Elevated liver function tests    History  Smoking status  . Never Smoker   Smokeless tobacco  . Not on file    History  Alcohol Use No    Family History  Problem Relation Age of Onset  . Colon cancer Mother   . Cancer Mother     colon  . Heart attack Father   . Hypertension Father   . Hypertension Sister   . Diabetes Sister     Review of Systems: Constitutional: no fever chills diaphoresis or fatigue or change in weight.  Head and neck: no hearing loss, no epistaxis, no photophobia or visual disturbance. Respiratory: No  cough, shortness of breath or wheezing. Cardiovascular: No chest pain peripheral edema, palpitations. Gastrointestinal: No abdominal distention, no abdominal pain, no change in bowel habits hematochezia or melena. Genitourinary: No dysuria, no frequency, no urgency, no nocturia. Musculoskeletal:No arthralgias, no back pain, no gait disturbance or myalgias. Neurological: No dizziness, no headaches, no numbness, no seizures, no syncope, no weakness, no tremors. Hematologic: No lymphadenopathy, no easy bruising. Psychiatric: No confusion, no hallucinations, no sleep disturbance.    Physical Exam: Filed Vitals:   03/26/12 1201  BP: 132/74  Pulse: 64   the general appearance reveals a well-developed well-nourished somewhat overweight gentleman in no acute  distress.The head and neck exam reveals pupils equal and reactive.  Extraocular movements are full.  There is no scleral icterus.  The mouth and pharynx are normal.  The neck is supple.  The carotids reveal a moderate pitched left carotid bruit. The jugular venous pressure is normal.  The  thyroid is not enlarged.  There is no lymphadenopathy.  The chest is clear to percussion and auscultation.  There are no rales or rhonchi.  Expansion of the chest is symmetrical.  The precordium is quiet.  The first heart sound is normal.  The second heart sound is physiologically split.  There is no murmur gallop rub or click.  There is no abnormal lift or heave.  The abdomen is soft and nontender.  The bowel sounds are normal.  The liver and spleen are not enlarged.  There are no abdominal masses.  There are no abdominal bruits.  Extremities reveal good pedal pulses.  There is bilateral soft pretibial and ankle edema. There is no cyanosis or clubbing.  Strength is normal and symmetrical in all extremities.  There is no lateralizing weakness.  There are no sensory deficits.  The skin is warm and dry.  There is no rash.     Assessment / Plan: Continue on same medication.  Obtain carotid Dopplers.  Recheck in 6 months for followup office visit.  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

## 2012-03-27 ENCOUNTER — Encounter (INDEPENDENT_AMBULATORY_CARE_PROVIDER_SITE_OTHER): Payer: Self-pay | Admitting: General Surgery

## 2012-03-27 ENCOUNTER — Ambulatory Visit (INDEPENDENT_AMBULATORY_CARE_PROVIDER_SITE_OTHER): Payer: BC Managed Care – PPO | Admitting: General Surgery

## 2012-03-27 VITALS — BP 118/64 | HR 80 | Temp 97.7°F | Resp 16 | Ht 70.0 in | Wt 273.0 lb

## 2012-03-27 DIAGNOSIS — Z09 Encounter for follow-up examination after completed treatment for conditions other than malignant neoplasm: Secondary | ICD-10-CM

## 2012-03-27 NOTE — Progress Notes (Signed)
Subjective:     Patient ID: Justin Bartlett, male   DOB: August 22, 1950, 62 y.o.   MRN: [id number]  HPI 62 year old Caucasian male comes in for followup after undergoing laparoscopic converted to open cholecystectomy on December 16 for a history of choledocholithiasis requiring multiple ERCPs and cholelithiasis. Please see the operative note for further details. It was a rather challenging cholecystectomy. However we were able to do an interoperative cholangiogram which demonstrated no extravasation as well as no significant filling defect. He was seen in the office on December 30 and his surgical drain and surgical skin staples were removed. There was apparently one area of erythema along the midportion of the incision that was noticed. He denies any fever, chills, nausea, vomiting, diarrhea or constipation. He states he's had a little bit of drainage from his incision; however, the redness has resolved. He states he's already gone back to work. He states that he has some occasional soreness in his right upper quadrant but it is getting better. He reports a good appetite. Last seen in office on 02/08/12  03/27/12 - still having some scant intermittent drainage from 2 area small areas of incision. No f/c/n/v/d/c/abd pain. Good appetite. Good energy. Retiring next Friday.   Review of Systems     Objective:   Physical Exam BP 118/64  Pulse 80  Temp(Src) 97.7 F (36.5 C) (Temporal)  Resp 16  Ht 5\' 10"  (1.778 m)  Wt 273 lb (123.832 kg)  BMI 39.17 kg/m2  Gen: alert, NAD, non-toxic appearing Pupils: equal, no scleral icterus,  Abd: soft, nontender, nondistended. Obese; Well-healed trocar sites. No cellulitis. No incisional hernia. Right subcostal incision is for the most part healed. 2 areas about 1-2 mm in size where skin separated. 1 area about 4mm in length with granulation tissue which was silver nitrated. Surrounding skin around incision looks irritated from bandages Skin: no rash, no jaundice      Assessment:     S/p laparoscopic cholecystectomy converted to open cholecystectomy with IOC    Plan:     Doing well. Can return to full activity.  He was encouraged to keep a dry gauze over the area and use minimal tape and stop using the current adhesive bandage he is using since it's causing skin changes.  I will see him in 8 weeks for a wound check  [name]. [name], MD, FACS General, Bariatric, & Minimally Invasive Surgery [facility] Surgery, [place]

## 2012-03-27 NOTE — Patient Instructions (Signed)
Try using a different bandage and tape (medipore)

## 2012-04-07 ENCOUNTER — Encounter (INDEPENDENT_AMBULATORY_CARE_PROVIDER_SITE_OTHER): Payer: BC Managed Care – PPO

## 2012-04-07 DIAGNOSIS — R0989 Other specified symptoms and signs involving the circulatory and respiratory systems: Secondary | ICD-10-CM

## 2012-04-07 DIAGNOSIS — I6529 Occlusion and stenosis of unspecified carotid artery: Secondary | ICD-10-CM

## 2012-04-25 ENCOUNTER — Telehealth (INDEPENDENT_AMBULATORY_CARE_PROVIDER_SITE_OTHER): Payer: Self-pay | Admitting: General Surgery

## 2012-04-25 NOTE — Telephone Encounter (Signed)
LMOM for patient making him aware I moved his appt from 05/15/2012 to 05/12/2012 due to MD bumping schedule. He is to call back if the new date and time do not work for him.

## 2012-05-12 ENCOUNTER — Encounter (INDEPENDENT_AMBULATORY_CARE_PROVIDER_SITE_OTHER): Payer: Self-pay | Admitting: General Surgery

## 2012-05-12 ENCOUNTER — Ambulatory Visit (INDEPENDENT_AMBULATORY_CARE_PROVIDER_SITE_OTHER): Payer: BC Managed Care – PPO | Admitting: General Surgery

## 2012-05-12 VITALS — BP 108/70 | HR 76 | Resp 16 | Ht 71.0 in | Wt 277.0 lb

## 2012-05-12 DIAGNOSIS — Z4889 Encounter for other specified surgical aftercare: Secondary | ICD-10-CM

## 2012-05-12 MED ORDER — ABDOMINAL BINDER/ELASTIC 3XL MISC: 1.0000 | Freq: Every morning | Status: DC

## 2012-05-12 NOTE — Patient Instructions (Signed)
Try wearing an abdominal binder with the gauze underneath it to prevent skin irritation.

## 2012-05-12 NOTE — Progress Notes (Signed)
Subjective:     Patient ID: Justin Bartlett, male   DOB: [birth year], 62 y.o.   MRN: [id number]  HPI 62 year old obese Caucasian male comes in for recheck after undergoing laparoscopic converted to open cholecystectomy in December 2013. I last saw him in the office on February 20. At that time he still had 2 areas that were draining from his right subcostal incision. He states there is now only one area that drains a very rare occasion. He denies any fever, chills, nausea, vomiting, diarrhea or constipation. He denies any poor appetite. He has some surrounding skin irritation from the tape.  PMHx, PSHx, SOCHx, FAMHx, ALL reviewed and unchanged  Review of Systems As above    Objective:   Physical Exam BP 108/70  Pulse 76  Resp 16  Ht 5\' 11"  (1.803 m)  Wt 277 lb (125.646 kg)  BMI 38.65 kg/m2 Alert, nad Nonfocal, MAE abd- obese, soft, nt, nd. Well healed RUQ subcostal incision except for about an area of about 0.5cm with granulation tissue. Has surrounding skin irritation from tape    Assessment:     Status post laparoscopic converted to open cholecystectomy     Plan:     I silver nitrated the one area with granulation tissue. I encouraged him To avoid using tape on the skin as this has caused some significant skin irritation. I gave him a prescription for an abdominal binder to wear in order to keep the gauze in place over the 1 area of his incision. Followup as needed  [name]. [name], MD, FACS General, Bariatric, & Minimally Invasive Surgery [facility], [place]

## 2012-05-15 ENCOUNTER — Encounter (INDEPENDENT_AMBULATORY_CARE_PROVIDER_SITE_OTHER): Payer: BC Managed Care – PPO | Admitting: General Surgery

## 2012-09-22 ENCOUNTER — Ambulatory Visit (INDEPENDENT_AMBULATORY_CARE_PROVIDER_SITE_OTHER): Payer: BC Managed Care – PPO | Admitting: Cardiology

## 2012-09-22 ENCOUNTER — Encounter: Payer: Self-pay | Admitting: Cardiology

## 2012-09-22 VITALS — BP 146/78 | HR 69 | Ht 71.0 in | Wt 280.8 lb

## 2012-09-22 DIAGNOSIS — I6522 Occlusion and stenosis of left carotid artery: Secondary | ICD-10-CM

## 2012-09-22 DIAGNOSIS — I259 Chronic ischemic heart disease, unspecified: Secondary | ICD-10-CM

## 2012-09-22 DIAGNOSIS — E7800 Pure hypercholesterolemia, unspecified: Secondary | ICD-10-CM

## 2012-09-22 DIAGNOSIS — E6609 Other obesity due to excess calories: Secondary | ICD-10-CM

## 2012-09-22 DIAGNOSIS — R0989 Other specified symptoms and signs involving the circulatory and respiratory systems: Secondary | ICD-10-CM

## 2012-09-22 DIAGNOSIS — I6529 Occlusion and stenosis of unspecified carotid artery: Secondary | ICD-10-CM

## 2012-09-22 DIAGNOSIS — E669 Obesity, unspecified: Secondary | ICD-10-CM

## 2012-09-22 NOTE — Assessment & Plan Note (Signed)
At his last visit we noted a left carotid bruit.  Subsequent duplex of the carotids on 04/07/12 showed a 40-59% right internal carotid artery stenosis and a 60-79% left internal carotid stenosis.  The recommendation was for a recheck in 6 months.  The patient has not been having any TIA symptoms.

## 2012-09-22 NOTE — Assessment & Plan Note (Signed)
The patient has not had any recurrent chest pain or angina.  He has a treadmill at home which he uses.

## 2012-09-22 NOTE — Assessment & Plan Note (Addendum)
Patient has a history of hypercholesterolemia.  His blood work is checked by Dr. Sharl Ma and by his PCP Dr. Manus Gunning.  He is on ezetimibe/simvastatin.  Is not having any myalgias.

## 2012-09-22 NOTE — Progress Notes (Signed)
Justin Bartlett Date of Birth:  1950-12-18 [facility] [address] [place], [place]  [address] [phone]         Fax   [phone]  History of Present Illness: This pleasant 62 year old gentleman is seen for a six-month followup office visit. He has a history of known ischemic heart disease. He had an anteroseptal myocardial infarction in 2001 and underwent coronary artery bypass graft surgery at that time. His last nuclear stress test 06/23/08 showed an old apical infarct with minimal reversible ischemia his ejection fraction was 41%. The patient has a history of hypertension, diabetes, and exogenous obesity. He is also followed by Dr. Luciana Axe for retinal disease. Since we last saw him he has had more problems with his gallbladder and previously underwent cholecystectomy by Dr. Gaynelle Adu. Prior to that Dr. Dulce Sellar had done several endoscopic procedures to extract a common duct stone.  The patient retired at the end of February 2014.  The next 3 months were spent driving back and forth to [place] to look after his  father-in-law who eventually died.   Current Outpatient Prescriptions  Medication Sig Dispense Refill  . ACCU-CHEK AVIVA PLUS test strip as directed.      . Canagliflozin (INVOKANA) 100 MG TABS Take 100 mg by mouth daily.      Marland Kitchen diltiazem (CARDIZEM CD) 240 MG 24 hr capsule Take 240 mg by mouth daily.      Marland Kitchen dipyridamole-aspirin (AGGRENOX) 200-25 MG per 12 hr capsule Take 1 capsule by mouth 2 (two) times daily.      . Elastic Bandages & Supports (ABDOMINAL BINDER/ELASTIC 3XL) MISC 1 Device by Does not apply route every morning.  1 each  2  . ezetimibe-simvastatin (VYTORIN) 10-80 MG per tablet Take 1 tablet by mouth at bedtime.        . insulin regular (HUMULIN R,NOVOLIN R) 100 units/mL injection Inject 10 Units into the skin 3 (three) times daily before meals.       . Insulin Syringe-Needle U-100 (INSULIN SYRINGE .3CC/29GX1/2") 29G X 1/2" 0.3 ML MISC  Inject as directed as directed.      . itraconazole (SPORANOX) 10 MG/ML solution Apply 200 mg topically daily. Apply to all toenails daily.      . metoprolol (LOPRESSOR) 100 MG tablet Take 100 mg by mouth 2 (two) times daily.       Marland Kitchen omeprazole (PRILOSEC) 20 MG capsule Take 20 mg by mouth 2 (two) times daily.       . ramipril (ALTACE) 10 MG tablet Take 10 mg by mouth daily.        No current facility-administered medications for this visit.    Allergies  Allergen Reactions  . Actos [Pioglitazone]     Elevated kidney function  . Hctz [Hydrochlorothiazide] Other (See Comments)    BP issues.     Patient Active Problem List   Diagnosis Date Noted  . Left carotid bruit 03/26/2012  . Ischemic heart disease 08/07/2010  . Benign hypertensive heart disease without heart failure 08/07/2010  . Diabetes mellitus 08/07/2010  . Exogenous obesity 08/07/2010  . Hypercholesterolemia 08/07/2010    History  Smoking status  . Never Smoker   Smokeless tobacco  . Not on file    History  Alcohol Use No    Family History  Problem Relation Age of Onset  . Colon cancer Mother   . Cancer Mother     colon  . Heart attack Father   . Hypertension Father   .  Hypertension Sister   . Diabetes Sister     Review of Systems: Constitutional: no fever chills diaphoresis or fatigue or change in weight.  Head and neck: no hearing loss, no epistaxis, no photophobia or visual disturbance. Respiratory: No cough, shortness of breath or wheezing. Cardiovascular: No chest pain peripheral edema, palpitations. Gastrointestinal: No abdominal distention, no abdominal pain, no change in bowel habits hematochezia or melena. Genitourinary: No dysuria, no frequency, no urgency, no nocturia. Musculoskeletal:No arthralgias, no back pain, no gait disturbance or myalgias. Neurological: No dizziness, no headaches, no numbness, no seizures, no syncope, no weakness, no tremors. Hematologic: No lymphadenopathy, no easy  bruising. Psychiatric: No confusion, no hallucinations, no sleep disturbance.    Physical Exam: Filed Vitals:   09/22/12 1005  BP: 146/78  Pulse: 69   the general appearance reveals a well-developed well-nourished gentleman in no distress.The head and neck exam reveals pupils equal and reactive.  Extraocular movements are full.  There is no scleral icterus.  The mouth and pharynx are normal.  The neck is supple.  The carotids reveal moderate left carotid bruit.  The jugular venous pressure is normal.  The  thyroid is not enlarged.  There is no lymphadenopathy.  The chest is clear to percussion and auscultation.  There are no rales or rhonchi.  Expansion of the chest is symmetrical.  The precordium is quiet.  The first heart sound is normal.  The second heart sound is physiologically split.  There is no murmur gallop rub or click.  There is no abnormal lift or heave.  The abdomen is soft and nontender.  The bowel sounds are normal.  The liver and spleen are not enlarged.  There are no abdominal masses.  There are no abdominal bruits.  Extremities reveal good pedal pulses.  There is no phlebitis or edema.  There is no cyanosis or clubbing.  Strength is normal and symmetrical in all extremities.  There is no lateralizing weakness.  There are no sensory deficits.  The skin is warm and dry.  There is no rash.  EKG today she normal sinus rhythm with first-degree A-V block and is unchanged from 09/14/11  Assessment / Plan: Continue same medication.  Work harder on diet and exercise and weight loss.  Arrange for repeat carotid duplex to followup on left carotid bruit. Recheck in 6 months for office visit.

## 2012-09-22 NOTE — Assessment & Plan Note (Signed)
His weight is up 7 pounds since last visit.  He intends to work harder on diet and exercise.

## 2012-09-22 NOTE — Patient Instructions (Signed)
Your physician has requested that you have a carotid duplex. This test is an ultrasound of the carotid arteries in your neck. It looks at blood flow through these arteries that supply the brain with blood. Allow one hour for this exam. There are no restrictions or special instructions.  SCHEDULE AFTER 10/09/12  Your physician recommends that you continue on your current medications as directed. Please refer to the Current Medication list given to you today.  Your physician wants you to follow-up in: 6 months You will receive a reminder letter in the mail two months in advance. If you don't receive a letter, please call our office to schedule the follow-up appointment.

## 2012-10-10 ENCOUNTER — Encounter: Payer: Self-pay | Admitting: Cardiology

## 2012-10-14 ENCOUNTER — Encounter (INDEPENDENT_AMBULATORY_CARE_PROVIDER_SITE_OTHER): Payer: BC Managed Care – PPO

## 2012-10-14 DIAGNOSIS — I6522 Occlusion and stenosis of left carotid artery: Secondary | ICD-10-CM

## 2012-10-14 DIAGNOSIS — I6529 Occlusion and stenosis of unspecified carotid artery: Secondary | ICD-10-CM

## 2012-10-17 ENCOUNTER — Telehealth: Payer: Self-pay | Admitting: *Deleted

## 2012-10-17 NOTE — Telephone Encounter (Signed)
Message copied by Burnell Blanks on Fri Oct 17, 2012  2:56 PM ------      Message from: Cassell Clement      Created: Wed Oct 15, 2012  5:21 AM       Please report. Carotid disease is stable. Continue same meds and aggressive risk factor modification. ------

## 2012-10-17 NOTE — Telephone Encounter (Signed)
Advised patient

## 2013-04-29 ENCOUNTER — Encounter: Payer: Self-pay | Admitting: Cardiology

## 2013-06-03 ENCOUNTER — Encounter: Payer: Self-pay | Admitting: Cardiology

## 2013-06-03 ENCOUNTER — Ambulatory Visit (INDEPENDENT_AMBULATORY_CARE_PROVIDER_SITE_OTHER): Payer: BC Managed Care – PPO | Admitting: Cardiology

## 2013-06-03 VITALS — BP 136/68 | HR 64 | Ht 71.0 in | Wt 291.0 lb

## 2013-06-03 DIAGNOSIS — E6609 Other obesity due to excess calories: Secondary | ICD-10-CM

## 2013-06-03 DIAGNOSIS — I6529 Occlusion and stenosis of unspecified carotid artery: Secondary | ICD-10-CM

## 2013-06-03 DIAGNOSIS — I6522 Occlusion and stenosis of left carotid artery: Secondary | ICD-10-CM

## 2013-06-03 DIAGNOSIS — I259 Chronic ischemic heart disease, unspecified: Secondary | ICD-10-CM

## 2013-06-03 DIAGNOSIS — E7800 Pure hypercholesterolemia, unspecified: Secondary | ICD-10-CM

## 2013-06-03 DIAGNOSIS — I119 Hypertensive heart disease without heart failure: Secondary | ICD-10-CM

## 2013-06-03 DIAGNOSIS — E669 Obesity, unspecified: Secondary | ICD-10-CM

## 2013-06-03 DIAGNOSIS — R0989 Other specified symptoms and signs involving the circulatory and respiratory systems: Secondary | ICD-10-CM

## 2013-06-03 NOTE — Assessment & Plan Note (Signed)
Patient has a history of a remote stroke in 2005.  His last carotid Dopplers in 19/9/14 showed 60-79% stenosis on the left and 40-59% stenosis on the right with recommendation for a repeat study in 6 months.  We'll proceed with the repeat study.  The patient has not been having any TIA symptoms.

## 2013-06-03 NOTE — Progress Notes (Signed)
Justin Bartlett Date of Birth:  07/05/1950 [facility] [address] [place], [place]  [id number] [phone]        Fax   [phone]   History of Present Illness: This pleasant 63 year old gentleman is seen for a six-month followup office visit. He has a history of known ischemic heart disease. He had an anteroseptal myocardial infarction in 2001 and underwent coronary artery bypass graft surgery at that time. His last nuclear stress test 06/23/08 showed an old apical infarct with minimal reversible ischemia his ejection fraction was 41%. The patient has a history of hypertension, diabetes, and exogenous obesity. He is also followed by Dr. Luciana Axeankin for retinal disease.  The patient is status post cholecystectomy.  The patient has a known left carotid bruit.  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  Current Outpatient Prescriptions  Medication Sig Dispense Refill  . ACCU-CHEK AVIVA PLUS test strip as directed.      . Canagliflozin (INVOKANA) 100 MG TABS Take 100 mg by mouth daily.      Marland Kitchen. diltiazem (CARDIZEM CD) 240 MG 24 hr capsule Take 240 mg by mouth daily.      Marland Kitchen. dipyridamole-aspirin (AGGRENOX) 200-25 MG per 12 hr capsule Take 1 capsule by mouth 2 (two) times daily.      Marland Kitchen. ezetimibe-simvastatin (VYTORIN) 10-80 MG per tablet Take 1 tablet by mouth at bedtime.        . insulin regular (HUMULIN R,NOVOLIN R) 100 units/mL injection Inject 14 Units into the skin 3 (three) times daily before meals.       . Insulin Syringe-Needle U-100 (INSULIN SYRINGE .3CC/29GX1/2") 29G X 1/2" 0.3 ML MISC Inject as directed as directed.      . itraconazole (SPORANOX) 10 MG/ML solution Apply 200 mg topically daily. Apply to all toenails daily.      . metoprolol (LOPRESSOR) 100 MG tablet Take 100 mg by mouth 2 (two) times daily.       . ramipril (ALTACE) 10 MG tablet Take 10 mg by mouth daily.         No current facility-administered medications for this visit.    Allergies  Allergen Reactions  . Actos [Pioglitazone]     Elevated kidney function  . Hctz [Hydrochlorothiazide] Other (See Comments)    BP issues.     Patient Active Problem List   Diagnosis Date Noted  . Left carotid bruit 03/26/2012  . Ischemic heart disease 08/07/2010  . Benign hypertensive heart disease without heart failure 08/07/2010  . Diabetes mellitus 08/07/2010  . Exogenous obesity 08/07/2010  . Hypercholesterolemia 08/07/2010    History  Smoking status  . Never Smoker   Smokeless tobacco  . Not on file    History  Alcohol Use No    Family History  Problem Relation Age of Onset  . Colon cancer Mother   . Cancer Mother     colon  . Heart attack Father   . Hypertension Father   . Hypertension Sister   . Diabetes Sister     Review of Systems: Constitutional: no fever chills diaphoresis or fatigue or change in weight.  Head and neck: no hearing loss, no epistaxis, no photophobia or visual disturbance. Respiratory: No cough, shortness of breath or wheezing. Cardiovascular: No chest pain peripheral edema, palpitations. Gastrointestinal: No abdominal distention, no abdominal pain, no change in bowel habits hematochezia or melena.  Genitourinary: No dysuria, no frequency, no urgency, no nocturia. Musculoskeletal:No arthralgias, no back pain, no gait disturbance or myalgias. Neurological: No dizziness, no headaches, no numbness, no seizures, no syncope, no weakness, no tremors. Hematologic: No lymphadenopathy, no easy bruising. Psychiatric: No confusion, no hallucinations, no sleep disturbance.    Physical Exam: Filed Vitals:   06/03/13 1548  BP: 136/68  Pulse: 64   the general appearance reveals a large middle-aged gentleman in no distress.The head and neck exam reveals pupils equal and reactive.  Extraocular movements are full.  There is no scleral icterus.  The mouth and pharynx are  normal.  The neck is supple.  The carotids reveal moderate left carotid bruit.  The jugular venous pressure is normal.  The  thyroid is not enlarged.  There is no lymphadenopathy.  The chest is clear to percussion and auscultation.  There are no rales or rhonchi.  Expansion of the chest is symmetrical.  The precordium is quiet.  The first heart sound is normal.  The second heart sound is physiologically split.  There is no murmur gallop rub or click.  There is no abnormal lift or heave.  The abdomen is soft and nontender.  The bowel sounds are normal.  The liver and spleen are not enlarged.  There are no abdominal masses.  There are no abdominal bruits.  Extremities reveal good pedal pulses.  There is no phlebitis or edema.  There is no cyanosis or clubbing.  Strength is normal and symmetrical in all extremities.  There is no lateralizing weakness.  There are no sensory deficits.  The skin is warm and dry.  There is no rash.     Assessment / Plan: 1. ischemic heart disease status post anteroseptal myocardial infarction and subsequent CABG in 2001. 2. benign hypertension without congestive heart failure 3. diabetes mellitus 4. bilateral carotid artery disease and history of remote stroke 5. Obesity  Plan: Continue same medication.  Repeat carotid duplex ultrasound. Recheck here in 6 months for office visit and EKG.  His lipids and blood work is followed by his PCP

## 2013-06-03 NOTE — Assessment & Plan Note (Signed)
Patient has gained 11 pounds since last visit.  He intends to do better with his diet and he is for that his part-time job involves a lot of standing and walking will help him lose weight.

## 2013-06-03 NOTE — Assessment & Plan Note (Signed)
The patient is not having any palpitations chest pain or shortness of breath

## 2013-06-03 NOTE — Patient Instructions (Signed)
Your physician recommends that you continue on your current medications as directed. Please refer to the Current Medication list given to you today.  Your physician wants you to follow-up in: 6 MONTH OV/EKG  You will receive a reminder letter in the mail two months in advance. If you don't receive a letter, please call our office to schedule the follow-up appointment.   Your physician has requested that you have a carotid duplex. This test is an ultrasound of the carotid arteries in your neck. It looks at blood flow through these arteries that supply the brain with blood. Allow one hour for this exam. There are no restrictions or special instructions.

## 2013-06-03 NOTE — Assessment & Plan Note (Signed)
The patient has not had any recurrent chest pain or angina.  He has not had to take any sublingual nitroglycerin. 

## 2013-06-05 ENCOUNTER — Ambulatory Visit (HOSPITAL_COMMUNITY): Payer: BC Managed Care – PPO | Attending: Cardiology | Admitting: Cardiology

## 2013-06-05 DIAGNOSIS — I6529 Occlusion and stenosis of unspecified carotid artery: Secondary | ICD-10-CM | POA: Insufficient documentation

## 2013-06-05 DIAGNOSIS — G458 Other transient cerebral ischemic attacks and related syndromes: Secondary | ICD-10-CM

## 2013-06-05 DIAGNOSIS — I6522 Occlusion and stenosis of left carotid artery: Secondary | ICD-10-CM

## 2013-06-05 DIAGNOSIS — Z951 Presence of aortocoronary bypass graft: Secondary | ICD-10-CM | POA: Insufficient documentation

## 2013-06-05 DIAGNOSIS — E119 Type 2 diabetes mellitus without complications: Secondary | ICD-10-CM | POA: Insufficient documentation

## 2013-06-05 DIAGNOSIS — I658 Occlusion and stenosis of other precerebral arteries: Secondary | ICD-10-CM | POA: Insufficient documentation

## 2013-06-05 DIAGNOSIS — I2510 Atherosclerotic heart disease of native coronary artery without angina pectoris: Secondary | ICD-10-CM | POA: Insufficient documentation

## 2013-06-05 DIAGNOSIS — Z8673 Personal history of transient ischemic attack (TIA), and cerebral infarction without residual deficits: Secondary | ICD-10-CM | POA: Insufficient documentation

## 2013-06-05 DIAGNOSIS — I10 Essential (primary) hypertension: Secondary | ICD-10-CM | POA: Insufficient documentation

## 2013-06-05 NOTE — Progress Notes (Signed)
Carotid duplex performed 

## 2013-06-08 ENCOUNTER — Telehealth: Payer: Self-pay | Admitting: *Deleted

## 2013-06-08 DIAGNOSIS — I6529 Occlusion and stenosis of unspecified carotid artery: Secondary | ICD-10-CM

## 2013-06-08 NOTE — Telephone Encounter (Signed)
Advised patient

## 2013-06-08 NOTE — Telephone Encounter (Signed)
Message copied by Burnell BlanksPRATT, Giavana Rooke B on Mon Jun 08, 2013 12:55 PM ------      Message from: Cassell ClementBRACKBILL, THOMAS      Created: Mon Jun 08, 2013  8:00 AM       Please report. The carotid dopplers were stable. Degree of stenosis unchanged from last study. No need for surgical intervention yet. Continue same meds. ------

## 2013-12-18 ENCOUNTER — Encounter: Payer: Self-pay | Admitting: Cardiology

## 2013-12-18 ENCOUNTER — Ambulatory Visit (INDEPENDENT_AMBULATORY_CARE_PROVIDER_SITE_OTHER): Payer: BC Managed Care – PPO | Admitting: Cardiology

## 2013-12-18 VITALS — BP 148/80 | HR 64 | Ht 71.0 in | Wt 293.8 lb

## 2013-12-18 DIAGNOSIS — I259 Chronic ischemic heart disease, unspecified: Secondary | ICD-10-CM

## 2013-12-18 DIAGNOSIS — E7800 Pure hypercholesterolemia, unspecified: Secondary | ICD-10-CM

## 2013-12-18 DIAGNOSIS — I119 Hypertensive heart disease without heart failure: Secondary | ICD-10-CM

## 2013-12-18 DIAGNOSIS — R0989 Other specified symptoms and signs involving the circulatory and respiratory systems: Secondary | ICD-10-CM

## 2013-12-18 DIAGNOSIS — I6522 Occlusion and stenosis of left carotid artery: Secondary | ICD-10-CM

## 2013-12-18 MED ORDER — HYDRALAZINE HCL 10 MG PO TABS: 10.0000 mg | ORAL_TABLET | Freq: Three times a day (TID) | ORAL | Status: DC

## 2013-12-18 NOTE — Assessment & Plan Note (Signed)
The patient has a treadmill in his garage as well as a set of weights.  He exercises regularly.  He has not been experiencing any recurrent chest pain or angina.

## 2013-12-18 NOTE — Progress Notes (Signed)
Justin Bartlett Date of Birth:  04/20/1950 [facility] [address] [place], [place]  [id number] [phone]        Fax   [phone]   History of Present Illness: This pleasant 63 year old gentleman is seen for a six-month followup office visit. He has a history of known ischemic heart disease. He had an anteroseptal myocardial infarction in 2001 and underwent coronary artery bypass graft surgery at that time. His last nuclear stress test 06/23/08 showed an old apical infarct with minimal reversible ischemia his ejection fraction was 41%. The patient has a history of hypertension, diabetes, and exogenous obesity. He is also followed by Dr. Luciana Axeankin for retinal disease.  The patient is status post cholecystectomy.  The patient has a known left carotid bruit.  Since we last saw him he began having problems with high potassiums.  He saw a nephrologist to stopped his ramipril.  Since then his blood pressure has been running higher particularly after exercise.  Current Outpatient Prescriptions  Medication Sig Dispense Refill  . ACCU-CHEK AVIVA PLUS test strip as directed.    . diltiazem (CARDIZEM CD) 240 MG 24 hr capsule Take 240 mg by mouth daily.    Marland Kitchen. dipyridamole-aspirin (AGGRENOX) 200-25 MG per 12 hr capsule Take 1 capsule by mouth 2 (two) times daily.    Marland Kitchen. ezetimibe-simvastatin (VYTORIN) 10-80 MG per tablet Take 1 tablet by mouth at bedtime.      . insulin regular (HUMULIN R,NOVOLIN R) 100 units/mL injection Inject 14 Units into the skin 3 (three) times daily before meals.     . Insulin Syringe-Needle U-100 (INSULIN SYRINGE .3CC/29GX1/2") 29G X 1/2" 0.3 ML MISC Inject as directed as directed.    . itraconazole (SPORANOX) 10 MG/ML solution Apply 200 mg topically daily. Apply to all toenails daily.    . metoprolol (LOPRESSOR) 100 MG tablet Take 100 mg by mouth 2 (two) times daily.     . hydrALAZINE (APRESOLINE) 10 MG tablet Take 1 tablet (10 mg total) by mouth 3 (three)  times daily. 90 tablet 11   No current facility-administered medications for this visit.    Allergies  Allergen Reactions  . Actos [Pioglitazone] Other (See Comments)    Elevated kidney function  . Hctz [Hydrochlorothiazide] Other (See Comments)    BP issues.     Patient Active Problem List   Diagnosis Date Noted  . Left carotid bruit 03/26/2012  . Ischemic heart disease 08/07/2010  . Benign hypertensive heart disease without heart failure 08/07/2010  . Diabetes mellitus 08/07/2010  . Exogenous obesity 08/07/2010  . Hypercholesterolemia 08/07/2010    History  Smoking status  . Never Smoker   Smokeless tobacco  . Not on file    History  Alcohol Use No    Family History  Problem Relation Age of Onset  . Colon cancer Mother   . Cancer Mother     colon  . Heart attack Father   . Hypertension Father   . Hypertension Sister   . Diabetes Sister     Review of Systems: Constitutional: no fever chills diaphoresis or fatigue or change in weight.  Head and neck: no hearing loss, no epistaxis, no photophobia or visual disturbance. Respiratory: No cough, shortness of breath or wheezing. Cardiovascular: No chest pain peripheral edema, palpitations. Gastrointestinal: No abdominal distention, no abdominal pain, no change in bowel habits hematochezia or melena. Genitourinary: No dysuria, no frequency, no urgency, no nocturia. Musculoskeletal:No arthralgias, no back pain, no gait disturbance  or myalgias. Neurological: No dizziness, no headaches, no numbness, no seizures, no syncope, no weakness, no tremors. Hematologic: No lymphadenopathy, no easy bruising. Psychiatric: No confusion, no hallucinations, no sleep disturbance.    Physical Exam: Filed Vitals:   12/18/13 0853  BP: 148/80  Pulse: 64   the general appearance reveals a large middle-aged gentleman in no distress.The head and neck exam reveals pupils equal and reactive.  Extraocular movements are full.  There is no  scleral icterus.  The mouth and pharynx are normal.  The neck is supple.  The carotids reveal moderate left carotid bruit.  The jugular venous pressure is normal.  The  thyroid is not enlarged.  There is no lymphadenopathy.  The chest is clear to percussion and auscultation.  There are no rales or rhonchi.  Expansion of the chest is symmetrical.  The precordium is quiet.  The first heart sound is normal.  The second heart sound is physiologically split.  There is no murmur gallop rub or click.  There is no abnormal lift or heave.  The abdomen is soft and nontender.  The bowel sounds are normal.  The liver and spleen are not enlarged.  There are no abdominal masses.  There are no abdominal bruits.  Extremities reveal good pedal pulses.  There is no phlebitis or edema.  There is no cyanosis or clubbing.  Strength is normal and symmetrical in all extremities.  There is no lateralizing weakness.  There are no sensory deficits.  The skin is warm and dry.  There is no rash.  EKG today shows normal sinus rhythm with first-degree AV block and ST and T-wave abnormalities consistent with lateral ischemia or strain.  No change since 09/22/12   Assessment / Plan: 1. ischemic heart disease status post anteroseptal myocardial infarction and subsequent CABG in 2001. 2. benign hypertension without congestive heart failure 3. diabetes mellitus 4. bilateral carotid artery disease and history of remote stroke 5. Obesity  Plan: Continue same medication.start hydralazine 10 mg 3 times a day for additional blood pressure control. Recheck here in 6 months for office visit.  His lipids and blood work are followed by his PCP Dr.Ehinger.

## 2013-12-18 NOTE — Assessment & Plan Note (Signed)
The patient has a known left carotid bruit.  He has been getting ultrasounds every 6 months but his insurance does not pay.  We will stretch out his intervals to at least 1 year.  The patient has not been experiencing any TIA or stroke symptoms.

## 2013-12-18 NOTE — Patient Instructions (Signed)
START HYDRALAZINE 10 MG THREE TIMES A DAY, RX SENT TO PHARMACY  Your physician wants you to follow-up in: 6 MONTH OV You will receive a reminder letter in the mail two months in advance. If you don't receive a letter, please call our office to schedule the follow-up appointment.

## 2013-12-18 NOTE — Assessment & Plan Note (Signed)
His blood pressure here today was higher.  After recheck he is still high.  His heart rate is only 60 so I do not want to increase his diltiazem any further.  Rather we will add hydralazine in a low dose of 10 mg 3 times a day for additional blood pressure control.  We will of course need to continue to avoid ace inhibitors or ARB's

## 2014-04-06 DIAGNOSIS — I214 Non-ST elevation (NSTEMI) myocardial infarction: Secondary | ICD-10-CM

## 2014-04-06 HISTORY — DX: Non-ST elevation (NSTEMI) myocardial infarction: I21.4

## 2014-04-23 ENCOUNTER — Telehealth: Payer: Self-pay | Admitting: Cardiology

## 2014-04-23 ENCOUNTER — Other Ambulatory Visit: Payer: Self-pay | Admitting: Family Medicine

## 2014-04-23 ENCOUNTER — Ambulatory Visit
Admission: RE | Admit: 2014-04-23 | Discharge: 2014-04-23 | Disposition: A | Discharge status: Home / Self Care | Payer: BC Managed Care – PPO | Source: Ambulatory Visit | Attending: Family Medicine | Admitting: Family Medicine

## 2014-04-23 DIAGNOSIS — R0609 Other forms of dyspnea: Principal | ICD-10-CM

## 2014-04-23 NOTE — Telephone Encounter (Signed)
PCP called today, Hgb 14. Chest xray IMPRESSION: CHF with small right pleural effusion and bilateral interstitial edema. PCP called and advised  Dr. Patty SermonsBrackbill not currently in office. PCP to start Lasix but requested patient be seen Monday.  Discussed with  Dr. Patty SermonsBrackbill and ok to put on flex schedule Called patient and scheduled appointment

## 2014-04-23 NOTE — Telephone Encounter (Signed)
Spoke with patient and since  Dr. Patty SermonsBrackbill was not currently in the office patient called PCP and has appointment this am at 10:30 Patient states he started with fatigue this week when he gets up and moves around  Advised to call back with update

## 2014-04-23 NOTE — Telephone Encounter (Signed)
New message      Pt states he is having extreme fatigue with any exertion.  Sitting down she is fine.  His bp this am is 130/53 and HR is 56

## 2014-04-26 ENCOUNTER — Ambulatory Visit (INDEPENDENT_AMBULATORY_CARE_PROVIDER_SITE_OTHER): Payer: BC Managed Care – PPO | Admitting: Nurse Practitioner

## 2014-04-26 ENCOUNTER — Telehealth: Payer: Self-pay | Admitting: *Deleted

## 2014-04-26 ENCOUNTER — Encounter: Payer: Self-pay | Admitting: Nurse Practitioner

## 2014-04-26 VITALS — BP 122/70 | HR 49 | Ht 70.0 in | Wt 302.6 lb

## 2014-04-26 DIAGNOSIS — R899 Unspecified abnormal finding in specimens from other organs, systems and tissues: Secondary | ICD-10-CM

## 2014-04-26 DIAGNOSIS — R0600 Dyspnea, unspecified: Secondary | ICD-10-CM

## 2014-04-26 DIAGNOSIS — I259 Chronic ischemic heart disease, unspecified: Secondary | ICD-10-CM

## 2014-04-26 LAB — BASIC METABOLIC PANEL
BUN: 41 mg/dL — ABNORMAL HIGH (ref 6–23)
CO2: 26 mEq/L (ref 19–32)
Calcium: 9.2 mg/dL (ref 8.4–10.5)
Chloride: 108 mEq/L (ref 96–112)
Creatinine, Ser: 2.29 mg/dL — ABNORMAL HIGH (ref 0.40–1.50)
GFR: 30.73 mL/min — ABNORMAL LOW (ref >60.00)
Glucose, Bld: 72 mg/dL (ref 70–99)
Potassium: 5.1 mEq/L (ref 3.5–5.1)
Sodium: 142 mEq/L (ref 135–145)

## 2014-04-26 LAB — BRAIN NATRIURETIC PEPTIDE: Pro B Natriuretic peptide (BNP): 241 pg/mL — ABNORMAL HIGH (ref 0.0–100.0)

## 2014-04-26 NOTE — Patient Instructions (Signed)
We will be checking the following labs today BMET, BNP  Increase Lasix to 60 mg for the next 3 days - then cut back to 40 mg a day  Weigh daily  Restrict your salt  Stay on your other medicines  We will get an echocardiogram -ASAP  We will get a Lexiscan Myoview  Call [facility] office at [phone] if you have any questions, problems or concerns.

## 2014-04-26 NOTE — Telephone Encounter (Signed)
Advised patient of lab results and scheduled lab check next week

## 2014-04-26 NOTE — Progress Notes (Signed)
CARDIOLOGY OFFICE NOTE  Date:  04/26/2014    Justin Bartlett Date of Birth: 08/19/1950 Medical Record #[id number]  PCP:  Thora Lance, MD  Cardiologist:  Patty Sermons    Chief Complaint  Patient presents with  . Shortness of Breath    Work in visit. Seen for Dr. Patty Sermons.      History of Present Illness: Justin Bartlett is a 64 y.o. male who presents today for a work in visit. He is seen for Dr. Patty Sermons. He has a history of known ischemic heart disease. He had an anteroseptal myocardial infarction in 2001 and underwent coronary artery bypass graft surgery at that time. His last nuclear stress test 06/23/08 showed an old apical infarct with minimal reversible ischemia his ejection fraction was 41%. The patient has a history of hypertension, diabetes, and exogenous obesity. He is also followed by Dr. Luciana Axe for retinal disease. The patient is status post cholecystectomy. The patient has a known left carotid bruit. He has had hyperkalemia with ACE. His labs are typically followed by primary care.   He was last seen here in November. Hydralazine was started for better BP control.  Phone call from Friday - PCP called today, Hgb 14. Chest xray IMPRESSION:CHF with small right pleural effusion and bilateral interstitial edema.  PCP called and advised Dr. Patty Sermons not currently in office. PCP to start Lasix but requested patient be seen Monday. Discussed with Dr. Patty Sermons and ok to put on flex schedule. Called patient and scheduled appointment.  Thus added to the FLEX schedule for today.  Comes in today. Here with his wife. Was doing ok up until last Wednesday. Wife thinks may have been going on a little longer. Got short of breath. Fatigued. Really abrupt onset. Weight is up. Saw his PCP on Friday - weight was up 14 pounds. Up 9 pounds here since last visit. Put on Lasix. No improvement in his breathing. "Just as tired and weak". Worse with exertion.  Uses compression stockings.  Belly is bloated. No appetite. No cough. No fever or chills. Can lie flat in the bed. No chest pain reported. Never had chest pain. He probably gets too much salt and has just stopped drinking soda. He tells me his blood count was checked and it was fine. No records sent. He used a wheelchair to get in here today which is unusual for him.   Past Medical History  Diagnosis Date  . Coronary artery disease   . Gallstones   . Hypercholesterolemia     takes Vytorin nightly  . Chronic kidney disease   . History of stomach ulcers     upon EGD with Dr. Adaline Sill Omeprazole daily  . Toenail fungus     Lamisil on feet daily and Sporanox daily  . Hypertension     takes Ramipril,Metoprolol,and Diltiazem daily  . Stroke 2006  . Peripheral edema     wears compression stockings daily  . Diabetes mellitus     insulin dependent;takes Humulin R    Past Surgical History  Procedure Laterality Date  . Cardiac catheterization  02/09/1999    LARGE AREA OF ANTERIOR AND APICAL  HYPOKINESIS PRESENT. EF 35%  . Cardiovascular stress test  06/2008    EF 41%  . Ankle surgery  1982    stainless steel rod and screws, LT ankle  . Ercp  06/13/2011    Procedure: ENDOSCOPIC RETROGRADE CHOLANGIOPANCREATOGRAPHY (ERCP);  Surgeon: Willis Modena, MD;  Location: [facility];  Service: Endoscopy;  Laterality:  N/A;  Joni Reiningicole requested to move pt due to another pt cancel  . Sphincterotomy  06/13/2011    Procedure: SPHINCTEROTOMY;  Surgeon: Willis ModenaWilliam Outlaw, MD;  Location: [facility];  Service: Endoscopy;;  . Biliary stent placement  06/13/2011    Procedure: BILIARY STENT PLACEMENT;  Surgeon: Willis ModenaWilliam Outlaw, MD;  Location: [facility];  Service: Endoscopy;  Laterality: N/A;  . Ercp  08/22/2011    Procedure: ENDOSCOPIC RETROGRADE CHOLANGIOPANCREATOGRAPHY (ERCP);  Surgeon: Willis ModenaWilliam Outlaw, MD;  [facility];  Service: Endoscopy;  Laterality: N/A;  . Ercp  10/24/2011    Procedure: ENDOSCOPIC RETROGRADE  CHOLANGIOPANCREATOGRAPHY (ERCP);  Surgeon: Willis ModenaWilliam Outlaw, MD;  [facility];  Service: Endoscopy;  Laterality: N/A;  Need Mccall Pera to help with case, Need to order Lithotripter,  10/03/11 lithotripter ordered by Everrett CoombeShannon Hicks office notified. ,  . Spyglass lithotripsy  10/24/2011    Procedure: AOZHYQMVSPYGLASS LITHOTRIPSY;  Surgeon: Willis ModenaWilliam Outlaw, MD;  Location: [facility];  Service: Endoscopy;  Laterality: N/A;  . Neck surgery  08/2003  . Coronary artery bypass graft  2001    x 5 vessels  . Bilateral knee surgery  12/1998  . Vasectomy  1995  . Bilateral cataract  surgery    . Esophagogastroduodenoscopy    . Colonoscopy    . Cholecystectomy  01/21/2012    Procedure: CHOLECYSTECTOMY;  Surgeon: Atilano InaEric M Wilson, MD,FACS;  Location: [facility];  Service: General;  Laterality: N/A;  . Cholecystectomy  01/21/2012    Procedure: LAPAROSCOPIC CHOLECYSTECTOMY;  Surgeon: Atilano InaEric M Wilson, MD,FACS;  Location: [facility];  Service: General;  Laterality: N/A;  attempted laparoscopic cholecystectomy  . Intraoperative cholangiogram  01/21/2012    Procedure: INTRAOPERATIVE CHOLANGIOGRAM;  Surgeon: Atilano InaEric M Wilson, MD,FACS;  Location: [facility];  Service: General;  Laterality: N/A;     Medications: Current Outpatient Prescriptions  Medication Sig Dispense Refill  . ACCU-CHEK AVIVA PLUS test strip as directed.    . diltiazem (CARDIZEM CD) 240 MG 24 hr capsule Take 240 mg by mouth daily.    Marland Kitchen. dipyridamole-aspirin (AGGRENOX) 200-25 MG per 12 hr capsule Take 1 capsule by mouth 2 (two) times daily.    Marland Kitchen. ezetimibe-simvastatin (VYTORIN) 10-80 MG per tablet Take 1 tablet by mouth at bedtime.      . furosemide (LASIX) 40 MG tablet Take 40 mg by mouth.    . hydrALAZINE (APRESOLINE) 10 MG tablet Take 1 tablet (10 mg total) by mouth 3 (three) times daily. 90 tablet 11  . insulin regular (HUMULIN R,NOVOLIN R) 100 units/mL injection Inject 14 Units into the skin 3 (three) times daily before meals.     . Insulin Syringe-Needle U-100  (INSULIN SYRINGE .3CC/29GX1/2") 29G X 1/2" 0.3 ML MISC Inject as directed as directed.    . itraconazole (SPORANOX) 10 MG/ML solution Apply 200 mg topically daily. Apply to all toenails daily.    . metoprolol (LOPRESSOR) 100 MG tablet Take 100 mg by mouth 2 (two) times daily.     . sitaGLIPtin (JANUVIA) 50 MG tablet Take 50 mg by mouth daily.     No current facility-administered medications for this visit.    Allergies: Allergies  Allergen Reactions  . Actos [Pioglitazone] Other (See Comments)    Elevated kidney function  . Hctz [Hydrochlorothiazide] Other (See Comments)    BP issues.     Social History: The patient  reports that he has never smoked. He does not have any smokeless tobacco history on file. He reports that he does not drink alcohol or use illicit  drugs.   Family History: The patient's family history includes Cancer in his mother; Colon cancer in his mother; Diabetes in his sister; Heart attack in his father; Hypertension in his father and sister.   Review of Systems: Please see the history of present illness.   Otherwise, the review of systems is positive for weight gain, appetite change, DOE, excessive fatigue and balance issues.   All other systems are reviewed and negative.   Physical Exam: VS:  BP 122/70 mmHg  Pulse 49  Ht  (1.778 m)  Wt 302 lb 9.6 oz (137.258 kg)  BMI 43.42 kg/m2 .  BMI Body mass index is 43.42 kg/(m^2).  Wt Readings from Last 3 Encounters:  04/26/14 302 lb 9.6 oz (137.258 kg)  12/18/13 293 lb 12.8 oz (133.267 kg)  06/03/13 291 lb (131.997 kg)    General: Pleasant. Well developed, well nourished and in no acute distress. He is obese.  Weight is up 9 pounds.  HEENT: Normal. Neck: Supple, no JVD, carotid bruits, or masses noted.  Cardiac: Regular rate and rhythm. No murmurs, rubs, or gallops. 1 to 2+ edema bilaterally. Respiratory:  Lungs are clear to auscultation bilaterally with normal work of breathing.  GI: Soft and nontender.    MS: No deformity or atrophy. Gait not tested.  Skin: Warm and dry. Color is normal.  Neuro:  Strength and sensation are intact and no gross focal deficits noted.  Psych: Alert, appropriate and with normal affect.   LABORATORY DATA:  EKG:  EKG is ordered today. This demonstrates sinus brady - prior septal infarct.  Lab Results  Component Value Date   WBC 19.7* 01/23/2012   HGB 12.0* 01/23/2012   HCT 37.9* 01/23/2012   PLT 192 01/23/2012   GLUCOSE 255* 01/23/2012   ALT 55* 01/23/2012   AST 46* 01/23/2012   NA 128* 01/23/2012   K 5.1 01/23/2012   CL 95* 01/23/2012   CREATININE 1.73* 01/23/2012   BUN 26* 01/23/2012   CO2 21 01/23/2012   INR 1.10 01/16/2012    BNP (last 3 results) No results for input(s): BNP in the last 8760 hours.  ProBNP (last 3 results) No results for input(s): PROBNP in the last 8760 hours.   Other Studies Reviewed Today:  No Myoview or echo noted in EPIC to review  Assessment/Plan: 1. CAD - prior MI with remote CABG - no active chest pain but sounds like he did not have a chest pain syndrome prior to his CABG. His grafts are 64 years old. Will need Myoview.   2. Sudden onset of dyspnea and weakness -  Worrisome for systolic HF - checking echo, BMET and BNP. Will increase his Lasix to 60 mg for 3 days and then back to 40 mg.   3. CKD -  Presumed  4. DM  5. HTN  6. Obesity  Current medicines are reviewed with the patient today.  The patient does not have concerns regarding medicines other than what has been noted above.  The following changes have been made:  See above.  Labs/ tests ordered today include:    Orders Placed This Encounter  Procedures  . Brain natriuretic peptide  . Basic metabolic panel  . Myocardial Perfusion Imaging  . 2D Echocardiogram without contrast     Disposition:   Further disposition to follow after his studies are complete.   Patient is agreeable to this plan and will call if any problems develop in the  interim.   Signed: Jennet Maduro.  Tyrone Sage, RN, ANP-C 04/26/2014 11:26 AM  [facility] [address] [place], [place]  [address] Phone: [phone] Fax: [phone]

## 2014-04-26 NOTE — Telephone Encounter (Signed)
-----   Message from Rosalio MacadamiaLori C Gerhardt, NP sent at 04/26/2014  4:37 PM EDT ----- Ok to report. Labs are satisfactory but kidney function is off. Needs repeat BMET on day of his Myoview

## 2014-04-27 ENCOUNTER — Encounter (HOSPITAL_COMMUNITY): Payer: Self-pay | Admitting: Emergency Medicine

## 2014-04-27 ENCOUNTER — Other Ambulatory Visit (HOSPITAL_COMMUNITY): Payer: BC Managed Care – PPO

## 2014-04-27 ENCOUNTER — Emergency Department (HOSPITAL_COMMUNITY): Payer: BC Managed Care – PPO

## 2014-04-27 ENCOUNTER — Telehealth: Payer: Self-pay | Admitting: Cardiology

## 2014-04-27 ENCOUNTER — Inpatient Hospital Stay (HOSPITAL_COMMUNITY)
Admission: EM | Admit: 2014-04-27 | Discharge: 2014-05-03 | DRG: 280 | Disposition: A | Discharge status: Home / Self Care | Payer: BC Managed Care – PPO | Attending: Internal Medicine | Admitting: Internal Medicine

## 2014-04-27 DIAGNOSIS — I129 Hypertensive chronic kidney disease with stage 1 through stage 4 chronic kidney disease, or unspecified chronic kidney disease: Secondary | ICD-10-CM | POA: Diagnosis present

## 2014-04-27 DIAGNOSIS — I25110 Atherosclerotic heart disease of native coronary artery with unstable angina pectoris: Secondary | ICD-10-CM | POA: Diagnosis not present

## 2014-04-27 DIAGNOSIS — Z833 Family history of diabetes mellitus: Secondary | ICD-10-CM | POA: Diagnosis not present

## 2014-04-27 DIAGNOSIS — I259 Chronic ischemic heart disease, unspecified: Secondary | ICD-10-CM

## 2014-04-27 DIAGNOSIS — N19 Unspecified kidney failure: Secondary | ICD-10-CM

## 2014-04-27 DIAGNOSIS — I119 Hypertensive heart disease without heart failure: Secondary | ICD-10-CM

## 2014-04-27 DIAGNOSIS — Z8673 Personal history of transient ischemic attack (TIA), and cerebral infarction without residual deficits: Secondary | ICD-10-CM | POA: Diagnosis not present

## 2014-04-27 DIAGNOSIS — Z6838 Body mass index (BMI) 38.0-38.9, adult: Secondary | ICD-10-CM

## 2014-04-27 DIAGNOSIS — E7800 Pure hypercholesterolemia, unspecified: Secondary | ICD-10-CM | POA: Diagnosis present

## 2014-04-27 DIAGNOSIS — N183 Chronic kidney disease, stage 3 (moderate): Secondary | ICD-10-CM | POA: Diagnosis not present

## 2014-04-27 DIAGNOSIS — E785 Hyperlipidemia, unspecified: Secondary | ICD-10-CM | POA: Diagnosis present

## 2014-04-27 DIAGNOSIS — R0602 Shortness of breath: Secondary | ICD-10-CM | POA: Insufficient documentation

## 2014-04-27 DIAGNOSIS — E669 Obesity, unspecified: Secondary | ICD-10-CM | POA: Diagnosis present

## 2014-04-27 DIAGNOSIS — I214 Non-ST elevation (NSTEMI) myocardial infarction: Secondary | ICD-10-CM | POA: Diagnosis present

## 2014-04-27 DIAGNOSIS — R079 Chest pain, unspecified: Secondary | ICD-10-CM | POA: Diagnosis not present

## 2014-04-27 DIAGNOSIS — Z79899 Other long term (current) drug therapy: Secondary | ICD-10-CM | POA: Diagnosis not present

## 2014-04-27 DIAGNOSIS — E0822 Diabetes mellitus due to underlying condition with diabetic chronic kidney disease: Secondary | ICD-10-CM | POA: Diagnosis not present

## 2014-04-27 DIAGNOSIS — Z8249 Family history of ischemic heart disease and other diseases of the circulatory system: Secondary | ICD-10-CM | POA: Diagnosis not present

## 2014-04-27 DIAGNOSIS — N184 Chronic kidney disease, stage 4 (severe): Secondary | ICD-10-CM | POA: Diagnosis present

## 2014-04-27 DIAGNOSIS — J9601 Acute respiratory failure with hypoxia: Secondary | ICD-10-CM | POA: Diagnosis present

## 2014-04-27 DIAGNOSIS — Z9049 Acquired absence of other specified parts of digestive tract: Secondary | ICD-10-CM | POA: Diagnosis present

## 2014-04-27 DIAGNOSIS — Z888 Allergy status to other drugs, medicaments and biological substances status: Secondary | ICD-10-CM

## 2014-04-27 DIAGNOSIS — E1165 Type 2 diabetes mellitus with hyperglycemia: Secondary | ICD-10-CM | POA: Diagnosis present

## 2014-04-27 DIAGNOSIS — E1122 Type 2 diabetes mellitus with diabetic chronic kidney disease: Secondary | ICD-10-CM | POA: Diagnosis present

## 2014-04-27 DIAGNOSIS — J90 Pleural effusion, not elsewhere classified: Secondary | ICD-10-CM | POA: Diagnosis present

## 2014-04-27 DIAGNOSIS — I25118 Atherosclerotic heart disease of native coronary artery with other forms of angina pectoris: Secondary | ICD-10-CM

## 2014-04-27 DIAGNOSIS — I255 Ischemic cardiomyopathy: Secondary | ICD-10-CM | POA: Diagnosis present

## 2014-04-27 DIAGNOSIS — I6529 Occlusion and stenosis of unspecified carotid artery: Secondary | ICD-10-CM | POA: Diagnosis present

## 2014-04-27 DIAGNOSIS — R0989 Other specified symptoms and signs involving the circulatory and respiratory systems: Secondary | ICD-10-CM

## 2014-04-27 DIAGNOSIS — R7989 Other specified abnormal findings of blood chemistry: Secondary | ICD-10-CM | POA: Diagnosis present

## 2014-04-27 DIAGNOSIS — E0821 Diabetes mellitus due to underlying condition with diabetic nephropathy: Secondary | ICD-10-CM

## 2014-04-27 DIAGNOSIS — I2510 Atherosclerotic heart disease of native coronary artery without angina pectoris: Secondary | ICD-10-CM | POA: Diagnosis present

## 2014-04-27 DIAGNOSIS — I5043 Acute on chronic combined systolic (congestive) and diastolic (congestive) heart failure: Principal | ICD-10-CM | POA: Diagnosis present

## 2014-04-27 DIAGNOSIS — I25119 Atherosclerotic heart disease of native coronary artery with unspecified angina pectoris: Secondary | ICD-10-CM | POA: Diagnosis not present

## 2014-04-27 DIAGNOSIS — I509 Heart failure, unspecified: Secondary | ICD-10-CM | POA: Diagnosis not present

## 2014-04-27 DIAGNOSIS — E6609 Other obesity due to excess calories: Secondary | ICD-10-CM

## 2014-04-27 DIAGNOSIS — I252 Old myocardial infarction: Secondary | ICD-10-CM | POA: Diagnosis not present

## 2014-04-27 DIAGNOSIS — Z951 Presence of aortocoronary bypass graft: Secondary | ICD-10-CM | POA: Diagnosis not present

## 2014-04-27 DIAGNOSIS — Z794 Long term (current) use of insulin: Secondary | ICD-10-CM

## 2014-04-27 DIAGNOSIS — N189 Chronic kidney disease, unspecified: Secondary | ICD-10-CM

## 2014-04-27 DIAGNOSIS — I5023 Acute on chronic systolic (congestive) heart failure: Secondary | ICD-10-CM | POA: Diagnosis present

## 2014-04-27 DIAGNOSIS — R778 Other specified abnormalities of plasma proteins: Secondary | ICD-10-CM | POA: Diagnosis present

## 2014-04-27 DIAGNOSIS — E118 Type 2 diabetes mellitus with unspecified complications: Secondary | ICD-10-CM

## 2014-04-27 DIAGNOSIS — I25810 Atherosclerosis of coronary artery bypass graft(s) without angina pectoris: Secondary | ICD-10-CM | POA: Diagnosis present

## 2014-04-27 DIAGNOSIS — R001 Bradycardia, unspecified: Secondary | ICD-10-CM | POA: Diagnosis present

## 2014-04-27 DIAGNOSIS — I10 Essential (primary) hypertension: Secondary | ICD-10-CM | POA: Diagnosis not present

## 2014-04-27 DIAGNOSIS — E119 Type 2 diabetes mellitus without complications: Secondary | ICD-10-CM

## 2014-04-27 HISTORY — DX: Atherosclerotic heart disease of native coronary artery without angina pectoris: I25.10

## 2014-04-27 HISTORY — DX: Presence of aortocoronary bypass graft: Z95.1

## 2014-04-27 LAB — BASIC METABOLIC PANEL
Anion gap: 8 (ref 5–15)
BUN: 43 mg/dL — ABNORMAL HIGH (ref 6–23)
CHLORIDE: 104 mmol/L (ref 96–112)
CO2: 28 mmol/L (ref 19–32)
Calcium: 8.4 mg/dL (ref 8.4–10.5)
Creatinine, Ser: 2.04 mg/dL — ABNORMAL HIGH (ref 0.50–1.35)
GFR calc Af Amer: 38 mL/min — ABNORMAL LOW (ref >90)
GFR calc non Af Amer: 33 mL/min — ABNORMAL LOW (ref >90)
GLUCOSE: 185 mg/dL — AB (ref 70–99)
POTASSIUM: 4.4 mmol/L (ref 3.5–5.1)
SODIUM: 140 mmol/L (ref 135–145)

## 2014-04-27 LAB — CBC WITH DIFFERENTIAL/PLATELET
BASOS ABS: 0.1 10*3/uL (ref 0.0–0.1)
Basophils Relative: 0 % (ref 0–1)
EOS ABS: 0.3 10*3/uL (ref 0.0–0.7)
EOS PCT: 3 % (ref 0–5)
HCT: 47.6 % (ref 39.0–52.0)
HEMOGLOBIN: 14.5 g/dL (ref 13.0–17.0)
Lymphocytes Relative: 9 % — ABNORMAL LOW (ref 12–46)
Lymphs Abs: 1.1 10*3/uL (ref 0.7–4.0)
MCH: 28.4 pg (ref 26.0–34.0)
MCHC: 30.5 g/dL (ref 30.0–36.0)
MCV: 93.3 fL (ref 78.0–100.0)
Monocytes Absolute: 1.3 10*3/uL — ABNORMAL HIGH (ref 0.1–1.0)
Monocytes Relative: 11 % (ref 3–12)
Neutro Abs: 9.2 10*3/uL — ABNORMAL HIGH (ref 1.7–7.7)
Neutrophils Relative %: 77 % (ref 43–77)
Platelets: 210 10*3/uL (ref 150–400)
RBC: 5.1 MIL/uL (ref 4.22–5.81)
RDW: 15.4 % (ref 11.5–15.5)
WBC: 12 10*3/uL — ABNORMAL HIGH (ref 4.0–10.5)

## 2014-04-27 LAB — GLUCOSE, CAPILLARY
GLUCOSE-CAPILLARY: 70 mg/dL (ref 70–99)
Glucose-Capillary: 38 mg/dL — CL (ref 70–99)

## 2014-04-27 LAB — PROTIME-INR
INR: 1.15 (ref 0.00–1.49)
Prothrombin Time: 14.8 seconds (ref 11.6–15.2)

## 2014-04-27 LAB — BRAIN NATRIURETIC PEPTIDE: B Natriuretic Peptide: 325.5 pg/mL — ABNORMAL HIGH (ref 0.0–100.0)

## 2014-04-27 LAB — LACTIC ACID, PLASMA
LACTIC ACID, VENOUS: 0.7 mmol/L (ref 0.5–2.0)
Lactic Acid, Venous: 3 mmol/L (ref 0.5–2.0)

## 2014-04-27 LAB — TROPONIN I
TROPONIN I: 0.05 ng/mL — AB (ref <0.031)
Troponin I: 1.48 ng/mL (ref <0.031)

## 2014-04-27 MED ORDER — FUROSEMIDE 10 MG/ML IJ SOLN: 40.0000 mg | Freq: Every day | INTRAMUSCULAR | Status: DC

## 2014-04-27 MED ORDER — HYDRALAZINE HCL 10 MG PO TABS
10.0000 mg | ORAL_TABLET | Freq: Three times a day (TID) | ORAL | Status: DC
  Administered 2014-04-27 – 2014-04-29 (×3): 10 mg via ORAL
  Filled 2014-04-27 (×10): qty 1

## 2014-04-27 MED ORDER — INSULIN ASPART 100 UNIT/ML ~~LOC~~ SOLN
0.0000 [IU] | Freq: Four times a day (QID) | SUBCUTANEOUS | Status: DC
  Administered 2014-04-28: 2 [IU] via SUBCUTANEOUS
  Administered 2014-04-29 (×2): 3 [IU] via SUBCUTANEOUS
  Administered 2014-04-30: 5 [IU] via SUBCUTANEOUS
  Administered 2014-04-30: 3 [IU] via SUBCUTANEOUS
  Administered 2014-04-30: 2 [IU] via SUBCUTANEOUS
  Administered 2014-04-30 – 2014-05-01 (×2): 3 [IU] via SUBCUTANEOUS
  Administered 2014-05-01: 8 [IU] via SUBCUTANEOUS
  Administered 2014-05-01: 5 [IU] via SUBCUTANEOUS
  Administered 2014-05-01 (×2): 8 [IU] via SUBCUTANEOUS

## 2014-04-27 MED ORDER — EZETIMIBE-SIMVASTATIN 10-80 MG PO TABS: 1.0000 | ORAL_TABLET | Freq: Every day | ORAL | Status: DC

## 2014-04-27 MED ORDER — ROSUVASTATIN CALCIUM 20 MG PO TABS
20.0000 mg | ORAL_TABLET | Freq: Every day | ORAL | Status: DC
  Administered 2014-04-28 – 2014-05-02 (×5): 20 mg via ORAL
  Filled 2014-04-27 (×6): qty 1

## 2014-04-27 MED ORDER — INSULIN ASPART 100 UNIT/ML ~~LOC~~ SOLN: 0.0000 [IU] | Freq: Every day | SUBCUTANEOUS | Status: DC

## 2014-04-27 MED ORDER — INSULIN ASPART 100 UNIT/ML ~~LOC~~ SOLN: 0.0000 [IU] | Freq: Three times a day (TID) | SUBCUTANEOUS | Status: DC

## 2014-04-27 MED ORDER — HEPARIN (PORCINE) IN NACL 100-0.45 UNIT/ML-% IJ SOLN
1200.0000 [IU]/h | INTRAMUSCULAR | Status: DC
  Administered 2014-04-27: 1200 [IU]/h via INTRAVENOUS
  Filled 2014-04-27: qty 250

## 2014-04-27 MED ORDER — METOPROLOL TARTRATE 100 MG PO TABS
100.0000 mg | ORAL_TABLET | Freq: Two times a day (BID) | ORAL | Status: DC
  Administered 2014-04-27 – 2014-05-03 (×10): 100 mg via ORAL
  Filled 2014-04-27 (×9): qty 1
  Filled 2014-04-27 (×2): qty 2
  Filled 2014-04-27 (×2): qty 1

## 2014-04-27 MED ORDER — EZETIMIBE 10 MG PO TABS
10.0000 mg | ORAL_TABLET | Freq: Every day | ORAL | Status: DC
  Administered 2014-04-27 – 2014-05-02 (×6): 10 mg via ORAL
  Filled 2014-04-27 (×7): qty 1

## 2014-04-27 MED ORDER — FUROSEMIDE 10 MG/ML IJ SOLN
40.0000 mg | INTRAMUSCULAR | Status: AC
  Administered 2014-04-27: 40 mg via INTRAVENOUS
  Filled 2014-04-27: qty 4

## 2014-04-27 MED ORDER — ASPIRIN EC 81 MG PO TBEC: 81.0000 mg | DELAYED_RELEASE_TABLET | Freq: Every day | ORAL | Status: DC

## 2014-04-27 MED ORDER — ASPIRIN 81 MG PO CHEW
324.0000 mg | CHEWABLE_TABLET | Freq: Once | ORAL | Status: AC
  Administered 2014-04-27: 324 mg via ORAL
  Filled 2014-04-27: qty 4

## 2014-04-27 MED ORDER — HEPARIN BOLUS VIA INFUSION
4000.0000 [IU] | Freq: Once | INTRAVENOUS | Status: AC
  Administered 2014-04-27: 4000 [IU] via INTRAVENOUS
  Filled 2014-04-27: qty 4000

## 2014-04-27 MED ORDER — ASPIRIN EC 325 MG PO TBEC
325.0000 mg | DELAYED_RELEASE_TABLET | Freq: Every day | ORAL | Status: DC
  Administered 2014-04-28 – 2014-05-03 (×6): 325 mg via ORAL
  Filled 2014-04-27 (×6): qty 1

## 2014-04-27 MED ORDER — ONDANSETRON HCL 4 MG/2ML IJ SOLN: 4.0000 mg | Freq: Three times a day (TID) | INTRAMUSCULAR | Status: AC | PRN

## 2014-04-27 MED ORDER — DEXTROSE 50 % IV SOLN
INTRAVENOUS | Status: AC
  Administered 2014-04-27: 25 mL
  Filled 2014-04-27: qty 50

## 2014-04-27 MED ORDER — ASPIRIN-DIPYRIDAMOLE ER 25-200 MG PO CP12
1.0000 | ORAL_CAPSULE | Freq: Two times a day (BID) | ORAL | Status: DC
  Administered 2014-04-27 – 2014-05-03 (×12): 1 via ORAL
  Filled 2014-04-27 (×13): qty 1

## 2014-04-27 MED ORDER — DILTIAZEM HCL ER COATED BEADS 240 MG PO CP24: 240.0000 mg | ORAL_CAPSULE | Freq: Every day | ORAL | Status: DC

## 2014-04-27 NOTE — Progress Notes (Signed)
CRITICAL VALUE ALERT  Critical value received:  Troponin 1.48   Date of notification:  04/27/2014   Time of notification:  2148  Critical value read back:Yes.    Nurse who received alert:  Katrinka BlazingK. Garcia, RN  MD notified (1st page):  Allena KatzPatel  Time of first page:  2150   Responding MD:  Allena KatzPatel  Time MD responded:  2150

## 2014-04-27 NOTE — Progress Notes (Signed)
CRITICAL VALUE ALERT  Critical value received: Lactic Acid 3.0  Date of notification:  04/27/14  Time of notification:  2139  Critical value read back:Yes.    Nurse who received alert:  Linward NatalLindsay Shaylynn Nulty, RN  MD notified (1st page):  Leodis BinetPatel, P  Time of first page:  Verbal 2139  MD notified (2nd page):  Time of second page:  Responding MD:  Leodis BinetPatel, P  Time MD responded:  Verbal, MD up on floor

## 2014-04-27 NOTE — H&P (Signed)
[facility] History and Physical  Patient: Justin Bartlett  MRN: [id number]  DOB: 03/30/1950  DOS: the patient was seen and examined on 04/27/2014 PCP: Thora Lance, MD  Chief Complaint: Shortness of breath and chest pain  HPI: Justin Bartlett is a 64 y.o. male with Past medical history of coronary artery disease status post CABG, chronic kidney disease, diabetes with retinopathy as well as chronic kidney disease, dyslipidemia, chronic leg swelling. The patient is presenting with complaints of chest pain. Patient was at his baseline and at around 2 PM when he was sleeping he suddenly started having pain on his left side which was sharp. The pain was not radiating. The pain was associated with shortness of breath. The pain resolved within 30 minutes on its own. Patient was scheduled for an echocardiogram outpatient, Due to presence of the pain and progressively worsening shortness of breath he decided to come to the ER. He mentions that since last 1-1/2 week he has been having progressively worsening shortness of breath with even minimal exertion. He also mentions that he has gained 14 pounds. With this he has seen his PCP on 04/23/2014 and his chest x-ray was showing right-sided pleural effusion with congestion and he was placed on Lasix 40 mg. Later on on 04/26/2014 the patient was seen by his cardiologist who placed him on increased dose of Lasix. His weight dropped from 299 pounds to 297 this morning.  Before that he had this chest pain and therefore he came to the hospital. He denies any leg pain or leg tenderness. He denies any recent travel or immobilization or surgery. He denies any dizziness lightheadedness, fever, chills, cough, runny nose. He denies any orthopnea or apnea overnight. He denies any nausea or vomiting diarrhea or constipation or active bleeding. He complains also decreased appetite since last one week.  The patient is coming from home And at his baseline independent  for most of his ADL.  Review of Systems: as mentioned in the history of present illness.  A Comprehensive review of the other systems is negative.  Past Medical History  Diagnosis Date  . Coronary artery disease   . Gallstones   . Hypercholesterolemia     takes Vytorin nightly  . Chronic kidney disease   . History of stomach ulcers     upon EGD with Dr. Adaline Sill Omeprazole daily  . Toenail fungus     Lamisil on feet daily and Sporanox daily  . Hypertension     takes Ramipril,Metoprolol,and Diltiazem daily  . Stroke 2006  . Peripheral edema     wears compression stockings daily  . Diabetes mellitus     insulin dependent;takes Humulin R   Past Surgical History  Procedure Laterality Date  . Cardiac catheterization  02/09/1999    LARGE AREA OF ANTERIOR AND APICAL  HYPOKINESIS PRESENT. EF 35%  . Cardiovascular stress test  06/2008    EF 41%  . Ankle surgery  1982    stainless steel rod and screws, LT ankle  . Ercp  06/13/2011    Procedure: ENDOSCOPIC RETROGRADE CHOLANGIOPANCREATOGRAPHY (ERCP);  Surgeon: Willis Modena, MD;  Location: [facility];  Service: Endoscopy;  Laterality: N/A;  Joni Reining requested to move pt due to another pt cancel  . Sphincterotomy  06/13/2011    Procedure: SPHINCTEROTOMY;  Surgeon: Willis Modena, MD;  Location: [facility];  Service: Endoscopy;;  . Biliary stent placement  06/13/2011    Procedure: BILIARY STENT PLACEMENT;  Surgeon: Willis Modena, MD;  Location: [facility];  Service: Endoscopy;  Laterality: N/A;  . Ercp  08/22/2011    Procedure: ENDOSCOPIC RETROGRADE CHOLANGIOPANCREATOGRAPHY (ERCP);  Surgeon: Willis Modena, MD;  Location: [facility];  Service: Endoscopy;  Laterality: N/A;  . Ercp  10/24/2011    Procedure: ENDOSCOPIC RETROGRADE CHOLANGIOPANCREATOGRAPHY (ERCP);  Surgeon: Willis Modena, MD;  Location: [facility];  Service: Endoscopy;  Laterality: N/A;  Need Mccall Pera to help with case, Need to order Lithotripter,  10/03/11 lithotripter  ordered by [facility] office notified. ,  . Spyglass lithotripsy  10/24/2011    Procedure: ZOXWRUEA LITHOTRIPSY;  Surgeon: Willis Modena, MD;  Location: [facility];  Service: Endoscopy;  Laterality: N/A;  . Neck surgery  08/2003  . Coronary artery bypass graft  2001    x 5 vessels  . Bilateral knee surgery  12/1998  . Vasectomy  1995  . Bilateral cataract  surgery    . Esophagogastroduodenoscopy    . Colonoscopy    . Cholecystectomy  01/21/2012    Procedure: CHOLECYSTECTOMY;  Surgeon: Atilano Ina, MD,FACS;  Location: [facility] OR;  Service: General;  Laterality: N/A;  . Cholecystectomy  01/21/2012    Procedure: LAPAROSCOPIC CHOLECYSTECTOMY;  Surgeon: Atilano Ina, MD,FACS;  Location: [facility] OR;  Service: General;  Laterality: N/A;  attempted laparoscopic cholecystectomy  . Intraoperative cholangiogram  01/21/2012    Procedure: INTRAOPERATIVE CHOLANGIOGRAM;  Surgeon: Atilano Ina, MD,FACS;  Location: [facility];  Service: General;  Laterality: N/A;   Social History:  reports that he has never smoked. He does not have any smokeless tobacco history on file. He reports that he does not drink alcohol or use illicit drugs.  Allergies  Allergen Reactions  . Actos [Pioglitazone] Other (See Comments)    Elevated kidney function  . Hctz [Hydrochlorothiazide] Other (See Comments)    BP issues.     Family History  Problem Relation Age of Onset  . Colon cancer Mother   . Cancer Mother     colon  . Heart attack Father   . Hypertension Father   . Hypertension Sister   . Diabetes Sister     Prior to Admission medications   Medication Sig Start Date End Date Taking? Authorizing Provider  ACCU-CHEK AVIVA PLUS test strip as directed. 08/29/11  Yes Historical Provider, MD  cholecalciferol (VITAMIN D) 1000 UNITS tablet Take 2,000 Units by mouth daily.   Yes Historical Provider, MD  diltiazem (CARDIZEM CD) 240 MG 24 hr capsule Take 240 mg by mouth daily.   Yes Historical Provider, MD   dipyridamole-aspirin (AGGRENOX) 200-25 MG per 12 hr capsule Take 1 capsule by mouth 2 (two) times daily. 10/31/11  Yes Willis Modena, MD  ezetimibe-simvastatin (VYTORIN) 10-80 MG per tablet Take 1 tablet by mouth at bedtime.     Yes Historical Provider, MD  furosemide (LASIX) 40 MG tablet Take 60 mg by mouth daily.    Yes Historical Provider, MD  HUMULIN R 500 UNIT/ML SOLN injection Inject 0.16 Units into the skin 3 (three) times daily. 04/15/14  Yes Historical Provider, MD  hydrALAZINE (APRESOLINE) 10 MG tablet Take 1 tablet (10 mg total) by mouth 3 (three) times daily. 12/18/13  Yes Cassell Clement, MD  Insulin Syringe-Needle U-100 (INSULIN SYRINGE .3CC/29GX1/2") 29G X 1/2" 0.3 ML MISC Inject as directed as directed. 02/13/11  Yes Historical Provider, MD  metoprolol (LOPRESSOR) 100 MG tablet Take 100 mg by mouth 2 (two) times daily.    Yes Historical Provider, MD  PREVIDENT 5000 BOOSTER PLUS 1.1 % PSTE Take 1 application by  mouth daily. 03/31/14  Yes Historical Provider, MD  sitaGLIPtin (JANUVIA) 50 MG tablet Take 50 mg by mouth daily.   Yes Historical Provider, MD    Physical Exam: Filed Vitals:   04/27/14 1800 04/27/14 1811 04/27/14 1830 04/27/14 1853  BP: 122/54  102/43 109/43  Pulse: 55  52 50  Temp:  98.6 F (37 C)    TempSrc:  Oral    Resp: 24  21 20   SpO2: 94%  96% 92%    General: Alert, Awake and Oriented to Time, Place and Person. Appear in mild distress Eyes: PERRL ENT: Oral Mucosa clear moist. Neck: Difficult to assess JVD Cardiovascular: S1 and S2 Present, no Murmur, Peripheral Pulses Present Respiratory: Bilateral Air entry equal and Decreased  on the right,  bilateral Crackles, no wheezes Abdomen: Bowel Sound present, Soft and non tender Skin: The back right side scratch Rash Extremities: Bilateral Pedal edema, no calf tenderness Neurologic: Grossly no focal neuro deficit.  Labs on Admission:  CBC:  Recent Labs Lab 04/27/14 1552  WBC 12.0*  NEUTROABS 9.2*  HGB  14.5  HCT 47.6  MCV 93.3  PLT 210    CMP     Component Value Date/Time   NA 140 04/27/2014 1552   K 4.4 04/27/2014 1552   CL 104 04/27/2014 1552   CO2 28 04/27/2014 1552   GLUCOSE 185* 04/27/2014 1552   BUN 43* 04/27/2014 1552   CREATININE 2.04* 04/27/2014 1552   CALCIUM 8.4 04/27/2014 1552   PROT 6.3 01/23/2012 0530   ALBUMIN 2.6* 01/23/2012 0530   AST 46* 01/23/2012 0530   ALT 55* 01/23/2012 0530   ALKPHOS 105 01/23/2012 0530   BILITOT 0.4 01/23/2012 0530   GFRNONAA 33* 04/27/2014 1552   GFRAA 38* 04/27/2014 1552    No results for input(s): LIPASE, AMYLASE in the last 168 hours.   Recent Labs Lab 04/27/14 1552  TROPONINI 0.05*   BNP (last 3 results)  Recent Labs  04/27/14 1552  BNP 325.5*    ProBNP (last 3 results)  Recent Labs  04/26/14 1149  PROBNP 241.0*     Radiological Exams on Admission: Dg Chest Port 1 View  04/27/2014   CLINICAL DATA:  64 year old male with shortness of breath with exertion. Initial encounter.  EXAM: PORTABLE CHEST - 1 VIEW  COMPARISON:  04/23/2014 and earlier.  FINDINGS: Two frontal views of the chest, 1 semi upright AP view. Continued confluent opacity at the right lung base, mildly improved since 04/23/2014. No associated air bronchograms. Stable cardiomegaly and mediastinal contours. No pneumothorax, pulmonary edema, left pleural effusion, or new pulmonary opacity identified.  IMPRESSION: Mildly improved ventilation at the right lung base since 04/23/2014. The residual confluent opacity could reflect effusion plus atelectasis or pneumonia.   Electronically Signed   By: Odessa FlemingH  Hall M.D.   On: 04/27/2014 16:05    EKG: Independently reviewed. normal sinus rhythm, lateral T-wave inversions unchanged from prior EKG.  Assessment/Plan Principal Problem:   Acute on chronic systolic CHF (congestive heart failure) Active Problems:   Diabetes mellitus   Hypercholesterolemia   Pleural effusion   CAD (coronary artery disease)   Ischemic  cardiomyopathy   Chest pain at rest   CKD (chronic kidney disease)   Carotid stenosis   Elevated troponin   1. Acute on chronic systolic CHF (congestive heart failure) The patient is presenting with complaints of progressively worsening shortness of breath and sudden onset of chest pain over last 2 weeks. He has been treated with oral Lasix  as an outpatient and has not improved significantly. With this his chest x-ray shows persistent right-sided haziness suspicious for atelectasis, and improved pleural effusion. He has bilateral leg swelling elevated proBNP and elevated troponin. Patient has already received 40 mg of IV Lasix and we will monitor daily ins and outs as well as daily weight. Echo cardiogram in the morning. Continue Toprol continue hydralazine. Incentive spirometry.  2. Elevated troponins. Possible non-STEMI. The patient is presenting with complaints of chest pain as well as the chest pain is currently resolved. EKG does not show any evidence of ST elevation MI. Patient's initial troponin was 0.0 5 repeat troponin was 1.48. With this cardiology was consulted over the phone. After reviewing patient's chart cardiology recommends to continue with medical management at present with anticoagulation with heparin, continue with aspirin. Echo cardiogram in the morning. They will consult in morning or as needed overnight depending on patient's progress.  3. Right-sided pleural effusion. The patient has a right-sided pleural effusion with possible atelectasis versus pneumonia. Patient does not have any complaints of off infection and therefore we will follow cultures at present. Since spirometry. Possibility of PE cannot be ruled out but symptoms suggest acute on chronic CHF more likely. Regardless patient will be heparinized for anticoagulation for ACS.  4. Diabetes mellitus. Patient is using U500 insulin at home. Currently we will use moderate sliding scale and keep the  patient nothing by mouth in anticipation of possible cardiac workup.  5. Essential hypertension, sinus bradycardia. The patient has history of hypertension and is currently on metoprolol as well as Cardizem. We will stop Cardizem at present due to persistent bradycardia. Continue metoprolol. May increase hydralazine. Nitroglycerin can be added if needed. Patient may benefit from changing metoprolol to Toprol or Coreg.  6. Chronic kidney disease. Creatinine stable from 2013. Continue close monitoring and daily ins and outs. Avoid nephrotoxic medication and renal dozing off all medications.  7. Dyslipidemia. As per pharmacy recommendation "pt's on vytorin 10/80 PTA. Zocor dose should not exceed  when given with diltiazem d/t risk for myopathy. Crestor  substituted for zocor  while patient is hospitalized. Consider changing patient's home statin to an alternative at discharge."  Advance goals of care discussion:  full code   Consults:  phone consultation with cardiology Dr.Turner   DVT Prophylaxis: therapeutic anticoagulation. Nutrition: Nothing by mouth except medications  Family Communication: family was present at bedside, opportunity was given to ask question and all questions were answered satisfactorily at the time of interview. Disposition: Admitted to inpatient in telemetry unit.  Author: Lynden Oxford, MD [facility] Pager: [phone] 04/27/2014, 9:03 PM    If 7PM-7AM, please contact night-coverage [email or web address] Password [email or web address]

## 2014-04-27 NOTE — ED Provider Notes (Signed)
CSN: [id number]     Arrival date & time 04/27/14  1511 History   First MD Initiated Contact with Patient 04/27/14 1522     Chief Complaint  Patient presents with  . Shortness of Breath  . Back Pain     (Consider location/radiation/quality/duration/timing/severity/associated sxs/prior Treatment) HPI Comments: The patient is a 64 year old male, he has a history of congestive heart failure, his echocardiogram from 2011 showed an ejection fraction of just over 40% with mild to moderate systolic dysfunction. He had a coronary artery bypass graft in the year 2000, since that time he has followed up with cardiologist. Approximately one week ago the patient had been developing shortness of breath and thus saw his family doctor who performed a chest x-ray and saw that he had some fluid overload and increased his Lasix dose. He followed up with his cardiologist yesterday, he is scheduled for an echocardiogram at 4:00 today and a nuclear stress test sometime next week. As the patient was at home today he decided to take a nap, when he laid down he developed left-sided chest pain going to his back, associated shortness of breath which has worsened prompting his visit to the emergency department. At this time the chest pain is resolved, the shortness of breath is persistent, he has a difficult time catching his breath, he is not on home oxygen. He has bilateral lower extremity edema which she states is not new but a chronic problem.  Patient is a 64 y.o. male presenting with shortness of breath and back pain. The history is provided by the patient and medical records.  Shortness of Breath Back Pain   Past Medical History  Diagnosis Date  . Coronary artery disease   . Gallstones   . Hypercholesterolemia     takes Vytorin nightly  . Chronic kidney disease   . History of stomach ulcers     upon EGD with Dr. Adaline Sill Omeprazole daily  . Toenail fungus     Lamisil on feet daily and Sporanox daily  .  Hypertension     takes Ramipril,Metoprolol,and Diltiazem daily  . Stroke 2006  . Peripheral edema     wears compression stockings daily  . Diabetes mellitus     insulin dependent;takes Humulin R   Past Surgical History  Procedure Laterality Date  . Cardiac catheterization  02/09/1999    LARGE AREA OF ANTERIOR AND APICAL  HYPOKINESIS PRESENT. EF 35%  . Cardiovascular stress test  06/2008    EF 41%  . Ankle surgery  1982    stainless steel rod and screws, LT ankle  . Ercp  06/13/2011    Procedure: ENDOSCOPIC RETROGRADE CHOLANGIOPANCREATOGRAPHY (ERCP);  Surgeon: Willis Modena, MD;  Location: [facility];  Service: Endoscopy;  Laterality: N/A;  Joni Reining requested to move pt due to another pt cancel  . Sphincterotomy  06/13/2011    Procedure: SPHINCTEROTOMY;  Surgeon: Willis Modena, MD;  Location: [facility];  Service: Endoscopy;;  . Biliary stent placement  06/13/2011    Procedure: BILIARY STENT PLACEMENT;  Surgeon: Willis Modena, MD;  Location: [facility];  Service: Endoscopy;  Laterality: N/A;  . Ercp  08/22/2011    Procedure: ENDOSCOPIC RETROGRADE CHOLANGIOPANCREATOGRAPHY (ERCP);  Surgeon: Willis Modena, MD;  Location: [facility];  Service: Endoscopy;  Laterality: N/A;  . Ercp  10/24/2011    Procedure: ENDOSCOPIC RETROGRADE CHOLANGIOPANCREATOGRAPHY (ERCP);  Surgeon: Willis Modena, MD;  Location: [facility];  Service: Endoscopy;  Laterality: N/A;  Need Mccall Pera to help with case, Need to order  Lithotripter,  10/03/11 lithotripter ordered by Everrett Coombe office notified. ,  . Spyglass lithotripsy  10/24/2011    Procedure: [id number] LITHOTRIPSY;  Surgeon: Willis Modena, MD;  Location: [facility];  Service: Endoscopy;  Laterality: N/A;  . Neck surgery  08/2003  . Coronary artery bypass graft  2001    x 5 vessels  . Bilateral knee surgery  12/1998  . Vasectomy  1995  . Bilateral cataract  surgery    . Esophagogastroduodenoscopy    . Colonoscopy    . Cholecystectomy  01/21/2012     Procedure: CHOLECYSTECTOMY;  Surgeon: Atilano Ina, MD,FACS;  Location: [facility] OR;  Service: General;  Laterality: N/A;  . Cholecystectomy  01/21/2012    Procedure: LAPAROSCOPIC CHOLECYSTECTOMY;  Surgeon: Atilano Ina, MD,FACS;  Location: [facility];  Service: General;  Laterality: N/A;  attempted laparoscopic cholecystectomy  . Intraoperative cholangiogram  01/21/2012    Procedure: INTRAOPERATIVE CHOLANGIOGRAM;  Surgeon: Atilano Ina, MD,FACS;  Location: [facility];  Service: General;  Laterality: N/A;   Family History  Problem Relation Age of Onset  . Colon cancer Mother   . Cancer Mother     colon  . Heart attack Father   . Hypertension Father   . Hypertension Sister   . Diabetes Sister    History  Substance Use Topics  . Smoking status: Never Smoker   . Smokeless tobacco: Not on file  . Alcohol Use: No    Review of Systems  Respiratory: Positive for shortness of breath.   Musculoskeletal: Positive for back pain.  All other systems reviewed and are negative.     Allergies  Actos and Hctz  Home Medications   Prior to Admission medications   Medication Sig Start Date End Date Taking? Authorizing Provider  ACCU-CHEK AVIVA PLUS test strip as directed. 08/29/11  Yes Historical Provider, MD  cholecalciferol (VITAMIN D) 1000 UNITS tablet Take 2,000 Units by mouth daily.   Yes Historical Provider, MD  diltiazem (CARDIZEM CD) 240 MG 24 hr capsule Take 240 mg by mouth daily.   Yes Historical Provider, MD  dipyridamole-aspirin (AGGRENOX) 200-25 MG per 12 hr capsule Take 1 capsule by mouth 2 (two) times daily. 10/31/11  Yes Willis Modena, MD  ezetimibe-simvastatin (VYTORIN) 10-80 MG per tablet Take 1 tablet by mouth at bedtime.     Yes Historical Provider, MD  furosemide (LASIX) 40 MG tablet Take 60 mg by mouth daily.    Yes Historical Provider, MD  HUMULIN R 500 UNIT/ML SOLN injection Inject 0.16 Units into the skin 3 (three) times daily. 04/15/14  Yes Historical Provider, MD  hydrALAZINE  (APRESOLINE) 10 MG tablet Take 1 tablet (10 mg total) by mouth 3 (three) times daily. 12/18/13  Yes Cassell Clement, MD  Insulin Syringe-Needle U-100 (INSULIN SYRINGE .3CC/29GX1/2") 29G X 1/2" 0.3 ML MISC Inject as directed as directed. 02/13/11  Yes Historical Provider, MD  metoprolol (LOPRESSOR) 100 MG tablet Take 100 mg by mouth 2 (two) times daily.    Yes Historical Provider, MD  PREVIDENT 5000 BOOSTER PLUS 1.1 % PSTE Take 1 application by mouth daily. 03/31/14  Yes Historical Provider, MD  sitaGLIPtin (JANUVIA) 50 MG tablet Take 50 mg by mouth daily.   Yes Historical Provider, MD   BP 109/43 mmHg  Pulse 50  Temp(Src) 98.6 F (37 C) (Oral)  Resp 20  SpO2 92% Physical Exam  Constitutional: He appears well-developed and well-nourished. He appears distressed (emergency mild respiratory distress - tachypnea).  HENT:  Head: Normocephalic and atraumatic.  Mouth/Throat: Oropharynx is clear and moist. No oropharyngeal exudate.  Dry MM  Eyes: Conjunctivae and EOM are normal. Pupils are equal, round, and reactive to light. Right eye exhibits no discharge. Left eye exhibits no discharge. No scleral icterus.  Neck: Normal range of motion. Neck supple. No JVD present. No thyromegaly present.  Cardiovascular: Normal rate, regular rhythm, normal heart sounds and intact distal pulses.  Exam reveals no gallop and no friction rub.   No murmur heard. Pulmonary/Chest: Effort normal and breath sounds normal. No respiratory distress. He has no wheezes. He has no rales.  Decreased BS at the R base  Abdominal: Soft. Bowel sounds are normal. He exhibits no distension and no mass. There is no tenderness.  Musculoskeletal: Normal range of motion. He exhibits edema ( bilateral symmetrical pitting edema of the LE's). He exhibits no tenderness.  Lymphadenopathy:    He has no cervical adenopathy.  Neurological: He is alert. Coordination normal.  Skin: Skin is warm and dry. Rash ( there is a papular / scabbing rash  across the upper chest  - states is scratching when he sweats at night) noted. No erythema.  Psychiatric: He has a normal mood and affect. His behavior is normal.  Nursing note and vitals reviewed.   ED Course  Procedures (including critical care time) Labs Review Labs Reviewed  BRAIN NATRIURETIC PEPTIDE - Abnormal; Notable for the following:    B Natriuretic Peptide 325.5 (*)    All other components within normal limits  TROPONIN I - Abnormal; Notable for the following:    Troponin I 0.05 (*)    All other components within normal limits  BASIC METABOLIC PANEL - Abnormal; Notable for the following:    Glucose, Bld 185 (*)    BUN 43 (*)    Creatinine, Ser 2.04 (*)    GFR calc non Af Amer 33 (*)    GFR calc Af Amer 38 (*)    All other components within normal limits  CBC WITH DIFFERENTIAL/PLATELET - Abnormal; Notable for the following:    WBC 12.0 (*)    Neutro Abs 9.2 (*)    Lymphocytes Relative 9 (*)    Monocytes Absolute 1.3 (*)    All other components within normal limits  CULTURE, BLOOD (ROUTINE X 2)  CULTURE, BLOOD (ROUTINE X 2)  PROTIME-INR  TROPONIN I  CK TOTAL AND CKMB  LACTIC ACID, PLASMA  LACTIC ACID, PLASMA    Imaging Review Dg Chest Port 1 View  04/27/2014   CLINICAL DATA:  64 year old male with shortness of breath with exertion. Initial encounter.  EXAM: PORTABLE CHEST - 1 VIEW  COMPARISON:  04/23/2014 and earlier.  FINDINGS: Two frontal views of the chest, 1 semi upright AP view. Continued confluent opacity at the right lung base, mildly improved since 04/23/2014. No associated air bronchograms. Stable cardiomegaly and mediastinal contours. No pneumothorax, pulmonary edema, left pleural effusion, or new pulmonary opacity identified.  IMPRESSION: Mildly improved ventilation at the right lung base since 04/23/2014. The residual confluent opacity could reflect effusion plus atelectasis or pneumonia.   Electronically Signed   By: Odessa Fleming M.D.   On: 04/27/2014 16:05      EKG Interpretation   Date/Time:  Tuesday April 27 2014 15:26:04 EDT Ventricular Rate:  74 PR Interval:  220 QRS Duration: 102 QT Interval:  416 QTC Calculation: 461 R Axis:   57 Text Interpretation:  Sinus rhythm Prolonged PR interval Abnormal T,  consider ischemia, lateral leads Baseline wander in lead(s) V3  since last  tracing no significant change Confirmed by Hyacinth MeekerMILLER  MD, Reis Pienta ([id number]) on  04/27/2014 3:32:02 PM      MDM   Final diagnoses:  SOB (shortness of breath)  Renal failure  Pleural effusion    The patient is superimposed findings of pulmonary edema on top of a right-sided pleural effusion, he appears to be tachypneic and hypoxic, he definitely has an oxygen requirement as he drifts down to 85% on room air and has increased dyspnea. Suspect congestive heart failure as the main source of this, he will need admission to the hospital with evaluation as well as evaluation of renal function as he was told that he had a "dry kidney" last night when his heart doctor called him with his results from yesterday. EKG shows no ischemic ischemia, unchanged since last, labs pending.  D/w Dr. Allena KatzPatel - he will admit.  O2 requirement is new.  Filed Vitals:   04/27/14 1800 04/27/14 1811 04/27/14 1830 04/27/14 1853  BP: 122/54  102/43 109/43  Pulse: 55  52 50  Temp:  98.6 F (37 C)    TempSrc:  Oral    Resp: 24  21 20   SpO2: 94%  96% 92%     Eber HongBrian Decie Verne, MD 04/27/14 2020

## 2014-04-27 NOTE — ED Notes (Signed)
This RN verified w/ lab that the Troponin and BMP can be ran from a dark green tube.

## 2014-04-27 NOTE — ED Notes (Signed)
Bed: [id number] Expected date:  Expected time:  Means of arrival:  Comments: EMS- back pain, recent pulmonary edema

## 2014-04-27 NOTE — Telephone Encounter (Signed)
Echo was cancelled today. Will forward to Nationwide Mutual InsuranceMelinda Pratt LPN and Dr.Brackbill.

## 2014-04-27 NOTE — Progress Notes (Signed)
EDCM spoke to patient at bedside.  Patient lives at home.  Patient confirms his pcp is Dr. Manus GunningEhinger and his cardiologist is Dr. Patty SermonsBrackbill.  Patient does not wear oxygen at home.  Patient is able to perform his ADL's on his own.  Patient is not interested ion home health services at this time.  No further EDCM needs at this time.

## 2014-04-27 NOTE — Telephone Encounter (Signed)
Thanks for the update

## 2014-04-27 NOTE — ED Notes (Signed)
Xray bedside.

## 2014-04-27 NOTE — ED Notes (Signed)
Around 11:00 this a.m. Pt felt like he couldn't get comfortable, began having mid back pain that he compared to when he needed his CABG surgery. Recently diagnosed with pulmonary edema and had an increase in his lasix dosage, complaint of mild shortness of breath. When EMS arrived on scene stated O2 sat at 82% on room air that increased to 98% with 4L of O2. Shortness of breath improved with no complaints of chest pain at this time. Just mid-thoracic back pain radiating into his left side.

## 2014-04-27 NOTE — Telephone Encounter (Signed)
New Msg       Pt wife calling, states pt is on the way to hospital by ambulance.   Pt began having chest pains.    Wife wanted to inform Dr. Patty SermonsBrackbill of why procedure was canceled.

## 2014-04-28 ENCOUNTER — Encounter (HOSPITAL_COMMUNITY): Payer: Self-pay | Admitting: Cardiology

## 2014-04-28 DIAGNOSIS — N184 Chronic kidney disease, stage 4 (severe): Secondary | ICD-10-CM

## 2014-04-28 DIAGNOSIS — I214 Non-ST elevation (NSTEMI) myocardial infarction: Secondary | ICD-10-CM

## 2014-04-28 DIAGNOSIS — N183 Chronic kidney disease, stage 3 (moderate): Secondary | ICD-10-CM

## 2014-04-28 DIAGNOSIS — I10 Essential (primary) hypertension: Secondary | ICD-10-CM

## 2014-04-28 LAB — CBC
HCT: 44.6 % (ref 39.0–52.0)
Hemoglobin: 13.3 g/dL (ref 13.0–17.0)
MCH: 27.7 pg (ref 26.0–34.0)
MCHC: 29.8 g/dL — ABNORMAL LOW (ref 30.0–36.0)
MCV: 92.7 fL (ref 78.0–100.0)
PLATELETS: 217 10*3/uL (ref 150–400)
RBC: 4.81 MIL/uL (ref 4.22–5.81)
RDW: 15.4 % (ref 11.5–15.5)
WBC: 12.7 10*3/uL — ABNORMAL HIGH (ref 4.0–10.5)

## 2014-04-28 LAB — CBC WITH DIFFERENTIAL/PLATELET
BASOS PCT: 0 % (ref 0–1)
Basophils Absolute: 0 10*3/uL (ref 0.0–0.1)
EOS ABS: 0.3 10*3/uL (ref 0.0–0.7)
Eosinophils Relative: 2 % (ref 0–5)
HEMATOCRIT: 48.3 % (ref 39.0–52.0)
HEMOGLOBIN: 14.3 g/dL (ref 13.0–17.0)
LYMPHS ABS: 1.1 10*3/uL (ref 0.7–4.0)
LYMPHS PCT: 8 % — AB (ref 12–46)
MCH: 27.8 pg (ref 26.0–34.0)
MCHC: 29.6 g/dL — ABNORMAL LOW (ref 30.0–36.0)
MCV: 94 fL (ref 78.0–100.0)
Monocytes Absolute: 1.4 10*3/uL — ABNORMAL HIGH (ref 0.1–1.0)
Monocytes Relative: 11 % (ref 3–12)
NEUTROS ABS: 10.2 10*3/uL — AB (ref 1.7–7.7)
Neutrophils Relative %: 79 % — ABNORMAL HIGH (ref 43–77)
Platelets: 202 10*3/uL (ref 150–400)
RBC: 5.14 MIL/uL (ref 4.22–5.81)
RDW: 15.5 % (ref 11.5–15.5)
WBC: 13 10*3/uL — ABNORMAL HIGH (ref 4.0–10.5)

## 2014-04-28 LAB — TROPONIN I
Troponin I: 1.78 ng/mL (ref <0.031)
Troponin I: 0.79 ng/mL (ref <0.031)
Troponin I: 0.48 ng/mL — ABNORMAL HIGH (ref <0.031)

## 2014-04-28 LAB — GLUCOSE, CAPILLARY
GLUCOSE-CAPILLARY: 138 mg/dL — AB (ref 70–99)
GLUCOSE-CAPILLARY: 51 mg/dL — AB (ref 70–99)
Glucose-Capillary: 134 mg/dL — ABNORMAL HIGH (ref 70–99)
Glucose-Capillary: 94 mg/dL (ref 70–99)

## 2014-04-28 LAB — COMPREHENSIVE METABOLIC PANEL
ALBUMIN: 3.1 g/dL — AB (ref 3.5–5.2)
ALT: 22 U/L (ref 0–53)
AST: 22 U/L (ref 0–37)
Alkaline Phosphatase: 96 U/L (ref 39–117)
Anion gap: 9 (ref 5–15)
BILIRUBIN TOTAL: 0.5 mg/dL (ref 0.3–1.2)
BUN: 42 mg/dL — ABNORMAL HIGH (ref 6–23)
CALCIUM: 8.2 mg/dL — AB (ref 8.4–10.5)
CO2: 29 mmol/L (ref 19–32)
CREATININE: 2.26 mg/dL — AB (ref 0.50–1.35)
Chloride: 103 mmol/L (ref 96–112)
GFR calc Af Amer: 34 mL/min — ABNORMAL LOW (ref >90)
GFR, EST NON AFRICAN AMERICAN: 29 mL/min — AB (ref >90)
Glucose, Bld: 147 mg/dL — ABNORMAL HIGH (ref 70–99)
Potassium: 6 mmol/L — ABNORMAL HIGH (ref 3.5–5.1)
SODIUM: 141 mmol/L (ref 135–145)
Total Protein: 6.6 g/dL (ref 6.0–8.3)

## 2014-04-28 LAB — PROTIME-INR
INR: 1.2 (ref 0.00–1.49)
Prothrombin Time: 15.4 seconds — ABNORMAL HIGH (ref 11.6–15.2)

## 2014-04-28 LAB — CK TOTAL AND CKMB (NOT AT ARMC)
CK TOTAL: 121 U/L (ref 7–232)
CK, MB: 8.7 ng/mL — AB (ref 0.3–4.0)
Relative Index: 7.2 — ABNORMAL HIGH (ref 0.0–2.5)

## 2014-04-28 LAB — LEGIONELLA ANTIGEN, URINE

## 2014-04-28 LAB — STREP PNEUMONIAE URINARY ANTIGEN: Strep Pneumo Urinary Antigen: NEGATIVE

## 2014-04-28 LAB — HEPARIN LEVEL (UNFRACTIONATED)
HEPARIN UNFRACTIONATED: 0.16 [IU]/mL — AB (ref 0.30–0.70)
Heparin Unfractionated: 0.11 IU/mL — ABNORMAL LOW (ref 0.30–0.70)

## 2014-04-28 MED ORDER — HEPARIN BOLUS VIA INFUSION
3000.0000 [IU] | Freq: Once | INTRAVENOUS | Status: AC
  Administered 2014-04-28: 3000 [IU] via INTRAVENOUS
  Filled 2014-04-28: qty 3000

## 2014-04-28 MED ORDER — SODIUM CHLORIDE 0.9 % IV SOLN
250.0000 mL | INTRAVENOUS | Status: DC | PRN
Start: 2014-04-28 — End: 2014-04-29

## 2014-04-28 MED ORDER — HEPARIN (PORCINE) IN NACL 100-0.45 UNIT/ML-% IJ SOLN
1800.0000 [IU]/h | INTRAMUSCULAR | Status: DC
Start: 2014-04-28 — End: 2014-04-29
  Administered 2014-04-28: 1500 [IU]/h via INTRAVENOUS
  Administered 2014-04-29: 1800 [IU]/h via INTRAVENOUS
  Filled 2014-04-28 (×5): qty 250

## 2014-04-28 MED ORDER — SODIUM CHLORIDE 0.9 % IJ SOLN
3.0000 mL | Freq: Two times a day (BID) | INTRAMUSCULAR | Status: DC
  Administered 2014-04-29: 3 mL via INTRAVENOUS

## 2014-04-28 MED ORDER — SODIUM CHLORIDE 0.9 % IJ SOLN: 3.0000 mL | INTRAMUSCULAR | Status: DC | PRN

## 2014-04-28 MED ORDER — FUROSEMIDE 10 MG/ML IJ SOLN
40.0000 mg | Freq: Two times a day (BID) | INTRAMUSCULAR | Status: DC
  Administered 2014-04-28: 40 mg via INTRAVENOUS
  Filled 2014-04-28: qty 4

## 2014-04-28 MED ORDER — CHLORHEXIDINE GLUCONATE 0.12 % MT SOLN
15.0000 mL | Freq: Two times a day (BID) | OROMUCOSAL | Status: DC
  Administered 2014-04-28 – 2014-05-02 (×9): 15 mL via OROMUCOSAL
  Filled 2014-04-28 (×13): qty 15

## 2014-04-28 MED ORDER — ASPIRIN 81 MG PO CHEW
81.0000 mg | CHEWABLE_TABLET | ORAL | Status: AC
  Administered 2014-04-29: 81 mg via ORAL
  Filled 2014-04-28: qty 1

## 2014-04-28 MED ORDER — CETYLPYRIDINIUM CHLORIDE 0.05 % MT LIQD
7.0000 mL | Freq: Two times a day (BID) | OROMUCOSAL | Status: DC
  Administered 2014-04-28 – 2014-05-02 (×6): 7 mL via OROMUCOSAL

## 2014-04-28 MED ORDER — DEXTROSE 50 % IV SOLN
INTRAVENOUS | Status: AC
  Administered 2014-04-28: 50 mL
  Filled 2014-04-28: qty 50

## 2014-04-28 NOTE — Clinical Documentation Improvement (Signed)
04/27/14 H&P..."6. Chronic kidney disease. Creatinine stable from 2013. Continue close monitoring and daily ins and outs. Avoid nephrotoxic medication and renal dozing off all medications."... 04/26/24 ED MD note: BUN 43 (*)     Creatinine, Ser 2.04 (*)    GFR calc non Af Amer 33 (*)       For accurate Dx specificity & severity can noted " Chronic kidney disease" be further specified with stage. Thank you  . Document the stage of CKD --Chronic kidney disease, stage 1- GFR > OR = 90 --Chronic kidney disease, stage 2 (mild) - GFR 60-89 --Chronic kidney disease, stage 3 (moderate) - GFR 30-59 --Chronic kidney disease, stage 4 (severe) - GFR 15-29 --Chronic kidney disease, stage 5- GFR < 15 --End-stage renal disease (ESRD) . Document any underlying cause of CKD such as Diabetes or Hypertension . Chronic renal failure without a documented stage will be assigned to Chronic kidney disease, unspecified . Document any associated diagnoses/conditions * Unable to determine  Supporting Information: See above note  Thank You,  Toribio Harbourphelia R Aven Cegielski, RN, BSN, CCDS Certified Clinical Documentation Specialist Pager: [phone] [place]: Health Information Management

## 2014-04-28 NOTE — Consult Note (Signed)
CARDIOLOGY CONSULT NOTE       Patient ID: Justin Bartlett MRN: [id number] DOB/AGE: 64/07/1950 64 y.o.  Admit date: 04/27/2014 Referring Physician:  Elisabeth Pigeonevine Primary Physician: Thora LanceEHINGER,ROBERT R, MD Primary Cardiologist:  Patty SermonsBrackbill Reason for Consultation:  Elevated Troponin, Chest pain and dyspnea   Principal Problem:   Acute on chronic systolic CHF (congestive heart failure) Active Problems:   Diabetes mellitus   Hypercholesterolemia   Pleural effusion   CAD (coronary artery disease)   Ischemic cardiomyopathy   Chest pain at rest   CKD (chronic kidney disease)   Carotid stenosis   Elevated troponin   HPI:  64 y.o. with  known ischemic heart disease. He had an anteroseptal myocardial infarction in 2001 and underwent coronary artery bypass graft surgery at that time. His last nuclear stress test 06/23/08 showed an old apical infarct with minimal reversible ischemia his ejection fraction was 41%. The patient has a history of hypertension, diabetes, and exogenous obesity. He is also followed by Dr. Luciana Axeankin for retinal disease. The patient is  The patient has a known left carotid bruit. Admitted with increasing SSCP over last 2 weeks  Most exertional along with worse elxertional dyspnea  Recent right pleural effusion with increasing doses of lasix and elevated BP started on hydralazine by Dr Patty SermonsBrackbill.  Still with intermitant pains this am  No acute ECG changes but elevated CPK and troponins   ROS All other systems reviewed and negative except as noted above  Past Medical History  Diagnosis Date  . Coronary artery disease   . Gallstones   . Hypercholesterolemia     takes Vytorin nightly  . Chronic kidney disease   . History of stomach ulcers     upon EGD with Dr. Adaline SillEdwards;takes Omeprazole daily  . Toenail fungus     Lamisil on feet daily and Sporanox daily  . Hypertension     takes Ramipril,Metoprolol,and Diltiazem daily  . Stroke 2006  . Peripheral edema     wears compression  stockings daily  . Diabetes mellitus     insulin dependent;takes Humulin R    Family History  Problem Relation Age of Onset  . Colon cancer Mother   . Cancer Mother     colon  . Heart attack Father   . Hypertension Father   . Hypertension Sister   . Diabetes Sister     History   Social History  . Marital Status: Married    Spouse Name: N/A  . Number of Children: N/A  . Years of Education: N/A   Occupational History  . Not on file.   Social History Main Topics  . Smoking status: Never Smoker   . Smokeless tobacco: Not on file  . Alcohol Use: No  . Drug Use: No  . Sexual Activity: Yes   Other Topics Concern  . Not on file   Social History Narrative    Past Surgical History  Procedure Laterality Date  . Cardiac catheterization  02/09/1999    LARGE AREA OF ANTERIOR AND APICAL  HYPOKINESIS PRESENT. EF 35%  . Cardiovascular stress test  06/2008    EF 41%  . Ankle surgery  1982    stainless steel rod and screws, LT ankle  . Ercp  06/13/2011    Procedure: ENDOSCOPIC RETROGRADE CHOLANGIOPANCREATOGRAPHY (ERCP);  Surgeon: Willis ModenaWilliam Outlaw, MD;  [facility];  Service: Endoscopy;  Laterality: N/A;  Joni Reiningicole requested to move pt due to another pt cancel  . Sphincterotomy  06/13/2011    Procedure:  SPHINCTEROTOMY;  Surgeon: Willis Modena, MD;  Location: [facility];  Service: Endoscopy;;  . Biliary stent placement  06/13/2011    Procedure: BILIARY STENT PLACEMENT;  Surgeon: Willis Modena, MD;  Location: [facility];  Service: Endoscopy;  Laterality: N/A;  . Ercp  08/22/2011    Procedure: ENDOSCOPIC RETROGRADE CHOLANGIOPANCREATOGRAPHY (ERCP);  Surgeon: Willis Modena, MD;  Location: [facility];  Service: Endoscopy;  Laterality: N/A;  . Ercp  10/24/2011    Procedure: ENDOSCOPIC RETROGRADE CHOLANGIOPANCREATOGRAPHY (ERCP);  Surgeon: Willis Modena, MD;  Location: [facility];  Service: Endoscopy;  Laterality: N/A;  Need Mccall Pera to help with case, Need to order Lithotripter,    10/03/11 lithotripter ordered by Everrett Coombe office notified. ,  . Spyglass lithotripsy  10/24/2011    Procedure: ZOXWRUEA LITHOTRIPSY;  Surgeon: Willis Modena, MD;  Location: [facility];  Service: Endoscopy;  Laterality: N/A;  . Neck surgery  08/2003  . Coronary artery bypass graft  2001    x 5 vessels  . Bilateral knee surgery  12/1998  . Vasectomy  1995  . Bilateral cataract  surgery    . Esophagogastroduodenoscopy    . Colonoscopy    . Cholecystectomy  01/21/2012    Procedure: CHOLECYSTECTOMY;  Surgeon: Atilano Ina, MD,FACS;  Location: [facility] OR;  Service: General;  Laterality: N/A;  . Cholecystectomy  01/21/2012    Procedure: LAPAROSCOPIC CHOLECYSTECTOMY;  Surgeon: Atilano Ina, MD,FACS;  Location: [facility] OR;  Service: General;  Laterality: N/A;  attempted laparoscopic cholecystectomy  . Intraoperative cholangiogram  01/21/2012    Procedure: INTRAOPERATIVE CHOLANGIOGRAM;  Surgeon: Atilano Ina, MD,FACS;  Location: [facility];  Service: General;  Laterality: N/A;     . antiseptic oral rinse  7 mL Mouth Rinse q12n4p  . aspirin EC  325 mg Oral Daily  . chlorhexidine  15 mL Mouth Rinse BID  . dipyridamole-aspirin  1 capsule Oral BID  . ezetimibe  10 mg Oral QHS   And  . rosuvastatin  20 mg Oral q1800  . furosemide  40 mg Intravenous BID  . hydrALAZINE  10 mg Oral TID  . insulin aspart  0-15 Units Subcutaneous 4 times per day  . metoprolol  100 mg Oral BID   . heparin 1,200 Units/hr (04/27/14 2309)    Physical Exam: Blood pressure 123/54, pulse 54, temperature 97.7 F (36.5 C), temperature source Oral, resp. rate 20, height  (1.803 m), weight 291 lb 14.2 oz (132.4 kg), SpO2 96 %.   Affect appropriate Obese white male HEENT: normal Neck supple with no adenopathy JVP normal no bruits no thyromegaly Lungs Decreased BS right base  no wheezing and good diaphragmatic motion Heart:  S1/S2 no murmur, no rub, gallop or click PMI normal Abdomen: benighn, BS positve, no  tenderness, no AAA no bruit.  No HSM or HJR Distal pulses intact with no bruits Plus one bilateral edema Neuro non-focal Skin warm and dry No muscular weakness   Labs:   Lab Results  Component Value Date   WBC 12.7* 04/28/2014   HGB 13.3 04/28/2014   HCT 44.6 04/28/2014   MCV 92.7 04/28/2014   PLT 217 04/28/2014    Recent Labs Lab 04/27/14 1552  NA 140  K 4.4  CL 104  CO2 28  BUN 43*  CREATININE 2.04*  CALCIUM 8.4  GLUCOSE 185*   Lab Results  Component Value Date   CKTOTAL 121 04/27/2014   CKMB 8.7* 04/27/2014   TROPONINI 0.79* 04/28/2014     Radiology: Dg Chest  2 View  04/23/2014   CLINICAL DATA:  Two days of dyspnea on exertion without chest pain; history of previous CABG  EXAM: CHEST  2 VIEW  COMPARISON:  PA and lateral chest x-ray of January 16, 2012  FINDINGS: The right hemidiaphragm remains higher than the left. There is a new small right pleural effusion. The pulmonary interstitial markings are increased bilaterally. The pulmonary vascularity is engorged and indistinct. The cardiac silhouette is normal in size. The patient has undergone previous CABG.  IMPRESSION: CHF with small right pleural effusion and bilateral interstitial edema.   Electronically Signed   By: David  [place]   On: 04/23/2014 12:20   Dg Chest Port 1 View  04/27/2014   CLINICAL DATA:  64 year old male with shortness of breath with exertion. Initial encounter.  EXAM: PORTABLE CHEST - 1 VIEW  COMPARISON:  04/23/2014 and earlier.  FINDINGS: Two frontal views of the chest, 1 semi upright AP view. Continued confluent opacity at the right lung base, mildly improved since 04/23/2014. No associated air bronchograms. Stable cardiomegaly and mediastinal contours. No pneumothorax, pulmonary edema, left pleural effusion, or new pulmonary opacity identified.  IMPRESSION: Mildly improved ventilation at the right lung base since 04/23/2014. The residual confluent opacity could reflect effusion plus atelectasis or  pneumonia.   Electronically Signed   By: Odessa Fleming M.D.   On: 04/27/2014 16:05    EKG:  SB rate 52 PR 230 inferolateral T wave changes    ASSESSMENT AND PLAN:  SEMI:  With increasing chest pains.  Known abnormal myovue and 64 yo bypass grafts  Discussed with patient. Transfer to [facility] tentative cath in am if Cr stable  Continue heparin Will decrease Lopressor dose due to bradycardia and long PR ? In setting of inferior ischemia Renal:  Will hydrate tonight for cath if no significant CHF clinically hold lasix after am dose  Chol:  On statin   DM:  Cover with SS insulin  Risks of cath including bleeding, stroke MI and worsening renal function discussed with patient and wife  Willing to proceed  Signed: Charlton Haws 04/28/2014, 9:07 AM

## 2014-04-28 NOTE — Progress Notes (Signed)
ANTICOAGULATION CONSULT NOTE - Follow Up Consult  Pharmacy Consult for Heparin Indication: chest pain/ACS  Allergies  Allergen Reactions  . Actos [Pioglitazone] Other (See Comments)    Elevated kidney function  . Hctz [Hydrochlorothiazide] Other (See Comments)    BP issues.     Patient Measurements: Height: 5\' 11"  (180.3 cm) Weight: 291 lb 0.1 oz (132 kg) IBW/kg (Calculated) : 75.3 Heparin Dosing Weight: 105 kg  Vital Signs: Temp: 98.1 F (36.7 C) (03/23 1309) Temp Source: Oral (03/23 1309) BP: 119/51 mmHg (03/23 1309) Pulse Rate: 57 (03/23 1309)  Labs:  Recent Labs  04/26/14 1149  04/27/14 1552 04/27/14 2047 04/28/14 0100 04/28/14 0722 04/28/14 0841 04/28/14 1706  HGB  --   < > 14.5  --  13.3  --  14.3  --   HCT  --   --  47.6  --  44.6  --  48.3  --   PLT  --   --  210  --  217  --  202  --   LABPROT  --   --  14.8  --   --   --  15.4*  --   INR  --   --  1.15  --   --   --  1.20  --   HEPARINUNFRC  --   --   --   --   --   --  0.11* 0.16*  CREATININE 2.29*  --  2.04*  --   --   --  2.26*  --   CKTOTAL  --   --   --  121  --   --   --   --   CKMB  --   --   --  8.7*  --   --   --   --   TROPONINI  --   < > 0.05* 1.48* 1.78* 0.79*  --   --   < > = values in this interval not displayed.  Estimated Creatinine Clearance: 45.8 mL/min (by C-G formula based on Cr of 2.26).   Medications:  Infusions:  . heparin 1,500 Units/hr (04/28/14 1028)    Assessment: 64 [facility] with chest pain and SOB.  PMH includes CAD s/p 5 vessel CABG, HLD, HTN, CKD, stroke, DM, edema, and stomach ulcers.  Cardiology was consulted, with positive troponins and possible NSTEMI.  [facility] is planned.  Pharmacy was consulted to dose IV Heparin.  Heparin level this PM is 0.16 after bolus of 3000 units and increase to 1500 units/hr. CBC is wnl. No bleeding reported.   Goal of Therapy:  Heparin level 0.3-0.7 units/ml Monitor platelets  by anticoagulation protocol: Yes   Plan:  Repeat heparin bolus of 3000 units IV x 1 Increase heparin IV infusion to 1800 units/hr Repeat Heparin level 6 hours after rate change. Daily heparin level and CBC Continue to monitor H&H and platelets  Link SnufferJessica Liany Mumpower, PharmD, BCPS Clinical Pharmacist [phone] 04/28/2014 6:28 PM

## 2014-04-28 NOTE — Progress Notes (Signed)
ANTICOAGULATION CONSULT NOTE - Initial Consult  Pharmacy Consult for Heparin Indication: chest pain/ACS  Allergies  Allergen Reactions  . Actos [Pioglitazone] Other (See Comments)    Elevated kidney function  . Hctz [Hydrochlorothiazide] Other (See Comments)    BP issues.     Patient Measurements: Height: 5\' 11"  (180.3 cm) Weight: 294 lb 5 oz (133.5 kg) IBW/kg (Calculated) : 75.3 Heparin Dosing Weight:   Vital Signs: Temp: 97.7 F (36.5 C) (03/23 0432) Temp Source: Oral (03/23 0432) BP: 123/54 mmHg (03/23 0432) Pulse Rate: 54 (03/23 0432)  Labs:  Recent Labs  04/26/14 1149 04/27/14 1552 04/27/14 2047 04/28/14 0100  HGB  --  14.5  --  13.3  HCT  --  47.6  --  44.6  PLT  --  210  --  217  LABPROT  --  14.8  --   --   INR  --  1.15  --   --   CREATININE 2.29* 2.04*  --   --   CKTOTAL  --   --  121  --   CKMB  --   --  8.7*  --   TROPONINI  --  0.05* 1.48* 1.78*    Estimated Creatinine Clearance: 51 mL/min (by C-G formula based on Cr of 2.04).   Medical History: Past Medical History  Diagnosis Date  . Coronary artery disease   . Gallstones   . Hypercholesterolemia     takes Vytorin nightly  . Chronic kidney disease   . History of stomach ulcers     upon EGD with Dr. Adaline SillEdwards;takes Omeprazole daily  . Toenail fungus     Lamisil on feet daily and Sporanox daily  . Hypertension     takes Ramipril,Metoprolol,and Diltiazem daily  . Stroke 2006  . Peripheral edema     wears compression stockings daily  . Diabetes mellitus     insulin dependent;takes Humulin R    Medications:  Infusions:  . heparin 1,200 Units/hr (04/27/14 2309)    Assessment: Past medical history of coronary artery disease status post CABG, chronic kidney disease, diabetes with retinopathy as well as chronic kidney disease, dyslipidemia, chronic leg swelling. The patient is presenting with complaints of chest pain/SOB  Goal of Therapy:  Heparin level 0.3-0.7 units/ml Monitor  platelets by anticoagulation protocol: Yes   Plan:  Heparin bolus 4000  units iv x1 Heparin drip at 1200 units/hr Daily CBC Next heparin level at  [address]    Jabrea Kallstrom Jr, Jacquenette ShoneJulian Crowford 04/28/2014,4:44 AM

## 2014-04-28 NOTE — Progress Notes (Signed)
Last night when patient's BS was checked at 2243, it was 38. Patient had no symptoms of hypoglycemia. Patient given 25ml of dextrose and BS was checked again at 2323 and it was 70. RN asked tech to check again in one hour just to make sure it didn't go back down. At 0042, BS was checked and it was 51. Still no symptoms of hypoglycemia. 50ml of dextrose given and at 0127, BS was 134.

## 2014-04-28 NOTE — Progress Notes (Signed)
ANTICOAGULATION CONSULT NOTE - Follow Up Consult  Pharmacy Consult for Heparin Indication: chest pain/ACS  Allergies  Allergen Reactions  . Actos [Pioglitazone] Other (See Comments)    Elevated kidney function  . Hctz [Hydrochlorothiazide] Other (See Comments)    BP issues.     Patient Measurements: Height: 5\' 11"  (180.3 cm) Weight: 291 lb 14.2 oz (132.4 kg) IBW/kg (Calculated) : 75.3 Heparin Dosing Weight: 105 kg  Vital Signs: Temp: 97.7 F (36.5 C) (03/23 0432) Temp Source: Oral (03/23 0432) BP: 123/54 mmHg (03/23 0432) Pulse Rate: 54 (03/23 0432)  Labs:  Recent Labs  04/26/14 1149 04/27/14 1552 04/27/14 2047 04/28/14 0100  HGB  --  14.5  --  13.3  HCT  --  47.6  --  44.6  PLT  --  210  --  217  LABPROT  --  14.8  --   --   INR  --  1.15  --   --   CREATININE 2.29* 2.04*  --   --   CKTOTAL  --   --  121  --   CKMB  --   --  8.7*  --   TROPONINI  --  0.05* 1.48* 1.78*    Estimated Creatinine Clearance: 50.8 mL/min (by C-G formula based on Cr of 2.04).   Medications:  Infusions:  . heparin 1,200 Units/hr (04/27/14 2309)    Assessment: 64 [facility] with chest pain and SOB.  PMH includes CAD s/p 5 vessel CABG, HLD, HTN, CKD, stroke, DM, edema, and stomach ulcers.  Cardiology was consulted, with positive troponins and possible NSTEMI.  [facility].  Pharmacy was consulted to dose IV Heparin.  Today, 04/28/2014:  HL 0.11, subtherapeutic on heparin infusion at 12 ml/hr  CBC: Hgb 14.3 and Plt 202  SCr 2.26 with CrCl ~ 46 ml/min  Troponin: 0.05, 1.48, 1.78, 0.79  Rn reports no bleeding, complications, or infusion problems.  Goal of Therapy:  Heparin level 0.3-0.7 units/ml Monitor platelets by anticoagulation protocol: Yes   Plan:   Give heparin 3000 units bolus IV x 1  Increase to heparin IV infusion at 1500 units/hr  Heparin level 6 hours after rate change.  Daily heparin level and  CBC  Continue to monitor H&H and platelets   Lynann Beaverhristine Zarahi Fuerst PharmD, BCPS Pager [phone] 04/28/2014 7:38 AM

## 2014-04-28 NOTE — Progress Notes (Signed)
0100 troponin came back at 1.78. Dr. Leodis BinetP. Patel was notified and new orders were given to get an EKG.

## 2014-04-28 NOTE — Progress Notes (Signed)
Patient ID: Justin Bartlett, male   DOB: Jul 05, 1950, 64 y.o.   MRN: [id number] [facility] PROGRESS NOTE  Justin Bartlett [id number] DOB: 1950/04/03 DOA: 04/27/2014 PCP: Thora Lance, MD  Brief narrative:    35 -year-old male with known past medical history of ischemic heart disease, anteroseptal myocardial infarction in 2001 status post CABG at that time, history of hypertension, diabetes, dyslipidemia, obesity, chronic kidney disease stage IV who presented to [facility] ED with worsening left-sided chest pain which started one day prior to the admission. Chest pain was 8 out of 10 in intensity, substernal, nonradiating, worse with exertion but it was present at rest. Chest pain apparently awoke patient from sleep, spontaneously resolved within 30 minutes on its own.  On admission, blood pressure was 102/43, heart rate 50 -68, RR 12 -24, afebrile and initial oxygen saturation 89% on room air but has improved to 97% with nasal cannula oxygen support. Blood work was notable for mild leukocytosis of 12.0, creatinine of 2.29, troponin of 1.48 and it has trended up to 1.78, potassium of 6. No acute ischemic changes identified on 12-lead EKG. Cardiology has seen the patient in consultation. Plan is for cardiac catheterization and transferred to [facility].   Assessment/Plan:    Principal problem: Acute on chronic systolic CHF (congestive heart failure) / acute respiratory failure with hypoxia - Patient presenting with complaints of worsening shortness of breath, chest pain, ongoing for past week or so. He has been treated with Lasix on outpatient basis but no significant improvement. - BNP is mildly elevated on this admission, around 300. Last 2-D echo on file in 2011 with ejection fraction of 40% - Continue Lasix 40 mg IV twice a day. - Daily weight and strict intake and output. Replete electrolytes as needed. Follow-up 2-D echo on this admission. - Appreciate cardiology following.  Recommendation is cardiac catheterization today.  Active problems: Elevated troponins./ NSTEMI - Troponin level 1.48 --> 1.78 --> 0.79. No acute ischemic changes on 12-lead EKG. - Patient was started on heparin drip. Also Aggrenox PO BID - Cardiology is following. - Plan for cardiac catheterization in [place]. Plan to transfer to [facility] today.  Right-sided pleural effusion / acute respiratory failure with hypoxia - Respiratory status stable this morning. Chest x-ray shows residual confluent opacity from 04/23/2014 study could reflect effusion, atelectasis or pneumonia. - No reports of fevers at home. Mild elevation in white blood cell count likely reactive. - Strep pneumonia is negative. Legionella is pending. Blood cultures are pending. Antibiotics were not started at the time of the admission.  Diabetes mellitus with renal manifestations - Patient is nothing by mouth for cardiac cath and now on insulin sliding scale. - We'll start his insulin regimen once he starts PO intake - Check A1c  Essential hypertension - Continue hydralazine, Lasix, metoprolol  Chronic kidney disease stage IV - Creatinine baseline 2.09 in 2013. Creatinine was 2.29 on this admission. - Continue to monitor renal function especially now that he will require cardiac catheterization and he is on Lasix.  Dyslipidemia - Continue Crestor 20 mg daily and Zetia 10 mg at bedtime    DVT Prophylaxis  - On heparin drip, aggrenox   Code Status: Full.  Family Communication:  plan of care discussed with the patient and his wife at the bedside Disposition Plan: transfer to [facility] for cardiac cath, will stay in [facility] after.   IV access:  Peripheral IV  Procedures and diagnostic studies:    Dg Chest  Port 1 View 04/27/2014   Mildly improved ventilation at the right lung base since 04/23/2014. The residual confluent opacity could reflect effusion plus atelectasis or pneumonia.   Electronically Signed   By: Odessa FlemingH   Hall M.D.   On: 04/27/2014 16:05   Medical Consultants:  Cardiology  Other Consultants:  None   IAnti-Infectives:   None    Manson PasseyEVINE, Monty Mccarrell, MD  [facility] Pager [phone]  If 7PM-7AM, please contact night-coverage [email or web address] Password [email or web address] 04/28/2014, 9:14 AM   LOS: 1 day    HPI/Subjective: No acute overnight events.  Objective: Filed Vitals:   04/27/14 2144 04/27/14 2327 04/28/14 0432 04/28/14 0608  BP:   123/54   Pulse:  66 54   Temp:   97.7 F (36.5 C)   TempSrc:   Oral   Resp:   20   Height: 5\' 11"  (1.803 m)     Weight: 133.5 kg (294 lb 5 oz)   132.4 kg (291 lb 14.2 oz)  SpO2:   96%     Intake/Output Summary (Last 24 hours) at 04/28/14 0914 Last data filed at 04/28/14 0300  Gross per 24 hour  Intake      0 ml  Output   1300 ml  Net  -1300 ml    Exam:   General:  Pt is alert, follows commands appropriately, not in acute distress  Cardiovascular: Regular rate and rhythm, S1/S2 appreciated   Respiratory: Clear to auscultation bilaterally, no wheezing, no crackles, no rhonchi  Abdomen: Soft, non tender, non distended, bowel sounds present  Extremities: pulses DP and PT palpable bilaterally  Neuro: Grossly nonfocal  Data Reviewed: Basic Metabolic Panel:  Recent Labs Lab 04/26/14 1149 04/27/14 1552  NA 142 140  K 5.1 4.4  CL 108 104  CO2 26 28  GLUCOSE 72 185*  BUN 41* 43*  CREATININE 2.29* 2.04*  CALCIUM 9.2 8.4   Liver Function Tests: No results for input(s): AST, ALT, ALKPHOS, BILITOT, PROT, ALBUMIN in the last 168 hours. No results for input(s): LIPASE, AMYLASE in the last 168 hours. No results for input(s): AMMONIA in the last 168 hours. CBC:  Recent Labs Lab 04/27/14 1552 04/28/14 0100 04/28/14 0841  WBC 12.0* 12.7* 13.0*  NEUTROABS 9.2*  --  10.2*  HGB 14.5 13.3 14.3  HCT 47.6 44.6 48.3  MCV 93.3 92.7 94.0  PLT 210 217 202   Cardiac Enzymes:  Recent Labs Lab 04/27/14 1552 04/27/14 2047 04/28/14 0100  04/28/14 0722  CKTOTAL  --  121  --   --   CKMB  --  8.7*  --   --   TROPONINI 0.05* 1.48* 1.78* 0.79*   BNP: Invalid input(s): POCBNP CBG:  Recent Labs Lab 04/27/14 2243 04/27/14 2323 04/28/14 0042 04/28/14 0127 04/28/14 0607  GLUCAP 38* 70 51* 134* 94    No results found for this or any previous visit (from the past 240 hour(s)).   Scheduled Meds: . antiseptic oral rinse  7 mL Mouth Rinse q12n4p  . aspirin EC  325 mg Oral Daily  . chlorhexidine  15 mL Mouth Rinse BID  . dipyridamole-aspirin  1 capsule Oral BID  . ezetimibe  10 mg Oral QHS   And  . rosuvastatin  20 mg Oral q1800  . furosemide  40 mg Intravenous BID  . hydrALAZINE  10 mg Oral TID  . insulin aspart  0-15 Units Subcutaneous 4 times per day  . metoprolol  100 mg Oral BID   Continuous Infusions: .  heparin 1,200 Units/hr (04/27/14 2309)

## 2014-04-28 NOTE — Progress Notes (Signed)
CRITICAL VALUE ALERT  Critical value received:  CKMB 8.7  Date of notification: 04/28/14  Time of notification:  0247  Critical value read back:Yes.    Nurse who received alert:  Linward NatalLindsay Sten Dematteo, RN   MD notified (1st page):  Leodis BinetPatel, P  Time of first page:  0249  MD notified (2nd page):  Time of second page:  Responding MD:  Leodis BinetPatel, P  Time MD responded:  [email or web address]  MD told that EKG was in the computer. No new orders given. Told to call if patient has any symptoms

## 2014-04-29 ENCOUNTER — Encounter (HOSPITAL_COMMUNITY)
Admission: EM | Disposition: A | Discharge status: Home / Self Care | Payer: Self-pay | Source: Home / Self Care | Attending: Internal Medicine

## 2014-04-29 ENCOUNTER — Encounter (HOSPITAL_COMMUNITY): Payer: Self-pay | Admitting: Cardiology

## 2014-04-29 DIAGNOSIS — R0602 Shortness of breath: Secondary | ICD-10-CM | POA: Insufficient documentation

## 2014-04-29 DIAGNOSIS — I2510 Atherosclerotic heart disease of native coronary artery without angina pectoris: Secondary | ICD-10-CM

## 2014-04-29 HISTORY — PX: LEFT HEART CATHETERIZATION WITH CORONARY/GRAFT ANGIOGRAM: SHX5450

## 2014-04-29 LAB — HEPARIN LEVEL (UNFRACTIONATED)
HEPARIN UNFRACTIONATED: 0.5 [IU]/mL (ref 0.30–0.70)
Heparin Unfractionated: 0.53 [IU]/mL (ref 0.30–0.70)

## 2014-04-29 LAB — BASIC METABOLIC PANEL
Anion gap: 8 (ref 5–15)
Anion gap: 8 (ref 5–15)
BUN: 41 mg/dL — ABNORMAL HIGH (ref 6–23)
BUN: 44 mg/dL — ABNORMAL HIGH (ref 6–23)
CALCIUM: 8.2 mg/dL — AB (ref 8.4–10.5)
CHLORIDE: 103 mmol/L (ref 96–112)
CHLORIDE: 102 mmol/L (ref 96–112)
CO2: 29 mmol/L (ref 19–32)
CO2: 31 mmol/L (ref 19–32)
Calcium: 8.1 mg/dL — ABNORMAL LOW (ref 8.4–10.5)
Creatinine, Ser: 2.19 mg/dL — ABNORMAL HIGH (ref 0.50–1.35)
Creatinine, Ser: 2.11 mg/dL — ABNORMAL HIGH (ref 0.50–1.35)
GFR calc Af Amer: 35 mL/min — ABNORMAL LOW (ref >90)
GFR calc non Af Amer: 30 mL/min — ABNORMAL LOW (ref >90)
GFR, EST AFRICAN AMERICAN: 36 mL/min — AB (ref >90)
GFR, EST NON AFRICAN AMERICAN: 31 mL/min — AB (ref >90)
GLUCOSE: 178 mg/dL — AB (ref 70–99)
GLUCOSE: 154 mg/dL — AB (ref 70–99)
POTASSIUM: 5 mmol/L (ref 3.5–5.1)
POTASSIUM: 4.4 mmol/L (ref 3.5–5.1)
SODIUM: 140 mmol/L (ref 135–145)
SODIUM: 141 mmol/L (ref 135–145)

## 2014-04-29 LAB — CBC
HCT: 43.3 % (ref 39.0–52.0)
Hemoglobin: 13 g/dL (ref 13.0–17.0)
MCH: 27.5 pg (ref 26.0–34.0)
MCHC: 30 g/dL (ref 30.0–36.0)
MCV: 91.7 fL (ref 78.0–100.0)
Platelets: 198 K/uL (ref 150–400)
RBC: 4.72 MIL/uL (ref 4.22–5.81)
RDW: 15.5 % (ref 11.5–15.5)
WBC: 11.2 K/uL — ABNORMAL HIGH (ref 4.0–10.5)

## 2014-04-29 LAB — POCT ACTIVATED CLOTTING TIME: Activated Clotting Time: 97 seconds

## 2014-04-29 LAB — GLUCOSE, CAPILLARY
Glucose-Capillary: 131 mg/dL — ABNORMAL HIGH (ref 70–99)
Glucose-Capillary: 157 mg/dL — ABNORMAL HIGH (ref 70–99)
Glucose-Capillary: 167 mg/dL — ABNORMAL HIGH (ref 70–99)
Glucose-Capillary: 168 mg/dL — ABNORMAL HIGH (ref 70–99)

## 2014-04-29 LAB — HEMOGLOBIN A1C
Hgb A1c MFr Bld: 9.2 % — ABNORMAL HIGH (ref 4.8–5.6)
Mean Plasma Glucose: 217 mg/dL

## 2014-04-29 SURGERY — LEFT HEART CATHETERIZATION WITH CORONARY/GRAFT ANGIOGRAM

## 2014-04-29 MED ORDER — FUROSEMIDE 10 MG/ML IJ SOLN
60.0000 mg | Freq: Two times a day (BID) | INTRAMUSCULAR | Status: AC
  Administered 2014-04-29 – 2014-04-30 (×3): 60 mg via INTRAVENOUS
  Filled 2014-04-29 (×2): qty 6

## 2014-04-29 MED ORDER — SODIUM CHLORIDE 0.9 % IJ SOLN: 3.0000 mL | INTRAMUSCULAR | Status: DC | PRN

## 2014-04-29 MED ORDER — DEXTROSE 5 % IV SOLN
INTRAVENOUS | Status: DC
  Filled 2014-04-29: qty 500

## 2014-04-29 MED ORDER — SODIUM CHLORIDE 0.9 % IV SOLN: 250.0000 mL | INTRAVENOUS | Status: DC | PRN

## 2014-04-29 MED ORDER — SODIUM CHLORIDE 0.9 % IJ SOLN
3.0000 mL | Freq: Two times a day (BID) | INTRAMUSCULAR | Status: DC
  Administered 2014-04-30 – 2014-05-02 (×6): 3 mL via INTRAVENOUS

## 2014-04-29 MED ORDER — DEXTROSE 5 % IV SOLN
INTRAVENOUS | Status: AC
  Administered 2014-04-29: 10:00:00 via INTRAVENOUS
  Filled 2014-04-29: qty 1000

## 2014-04-29 MED ORDER — HEPARIN (PORCINE) IN NACL 2-0.9 UNIT/ML-% IJ SOLN
INTRAMUSCULAR | Status: AC
  Filled 2014-04-29: qty 1000

## 2014-04-29 MED ORDER — NITROGLYCERIN 1 MG/10 ML FOR IR/CATH LAB
INTRA_ARTERIAL | Status: AC
  Filled 2014-04-29: qty 10

## 2014-04-29 MED ORDER — LIDOCAINE HCL (PF) 1 % IJ SOLN
INTRAMUSCULAR | Status: AC
  Filled 2014-04-29: qty 30

## 2014-04-29 MED ORDER — HEPARIN SODIUM (PORCINE) 5000 UNIT/ML IJ SOLN
5000.0000 [IU] | Freq: Three times a day (TID) | INTRAMUSCULAR | Status: DC
  Administered 2014-04-30 – 2014-05-03 (×10): 5000 [IU] via SUBCUTANEOUS
  Filled 2014-04-29 (×12): qty 1

## 2014-04-29 MED ORDER — MORPHINE SULFATE 2 MG/ML IJ SOLN: 2.0000 mg | INTRAMUSCULAR | Status: DC | PRN

## 2014-04-29 MED ORDER — MIDAZOLAM HCL 2 MG/2ML IJ SOLN
INTRAMUSCULAR | Status: AC
  Filled 2014-04-29: qty 2

## 2014-04-29 MED ORDER — SODIUM CHLORIDE 0.9 % IV SOLN
INTRAVENOUS | Status: DC
  Administered 2014-04-29: 06:00:00 via INTRAVENOUS

## 2014-04-29 MED ORDER — FENTANYL CITRATE 0.05 MG/ML IJ SOLN
INTRAMUSCULAR | Status: AC
  Filled 2014-04-29: qty 2

## 2014-04-29 MED ORDER — SODIUM CHLORIDE 0.9 % IV SOLN
1.0000 mL/kg/h | INTRAVENOUS | Status: DC
  Administered 2014-04-29: 1 mL/kg/h via INTRAVENOUS

## 2014-04-29 MED ORDER — SODIUM BICARBONATE BOLUS VIA INFUSION
INTRAVENOUS | Status: DC
  Filled 2014-04-29: qty 1

## 2014-04-29 NOTE — Progress Notes (Signed)
Pt refusing PO hydralazine, due to BP being 110/50.  Pt says that 110/50 is already low for him and he doesn't want it to drop any lower.  Will continue to monitor.

## 2014-04-29 NOTE — Progress Notes (Signed)
ANTICOAGULATION CONSULT NOTE - Follow Up Consult  Pharmacy Consult for Heparin Indication: chest pain/ACS  Allergies  Allergen Reactions  . Actos [Pioglitazone] Other (See Comments)    Elevated kidney function  . Hctz [Hydrochlorothiazide] Other (See Comments)    BP issues.     Patient Measurements: Height: 5\' 11"  (180.3 cm) Weight: 289 lb 14.5 oz (131.5 kg) IBW/kg (Calculated) : 75.3 Heparin Dosing Weight: 105 kg  Vital Signs: Temp: 98.4 F (36.9 C) (03/24 0500) Temp Source: Oral (03/24 0500) BP: 110/50 mmHg (03/24 0945) Pulse Rate: 70 (03/24 0945)  Labs:  Recent Labs  04/27/14 1552 04/27/14 2047 04/28/14 0100 04/28/14 0722  04/28/14 0841 04/28/14 1706 04/29/14 0107 04/29/14 0516 04/29/14 0755  HGB 14.5  --  13.3  --   --  14.3  --  13.0  --   --   HCT 47.6  --  44.6  --   --  48.3  --  43.3  --   --   PLT 210  --  217  --   --  202  --  198  --   --   LABPROT 14.8  --   --   --   --  15.4*  --   --   --   --   INR 1.15  --   --   --   --  1.20  --   --   --   --   HEPARINUNFRC  --   --   --   --   < > 0.11* 0.16* 0.53  --  0.50  CREATININE 2.04*  --   --   --   --  2.26*  --  2.19* 2.11*  --   CKTOTAL  --  121  --   --   --   --   --   --   --   --   CKMB  --  8.7*  --   --   --   --   --   --   --   --   TROPONINI 0.05* 1.48* 1.78* 0.79*  --   --  0.48*  --   --   --   < > = values in this interval not displayed.  Estimated Creatinine Clearance: 48.9 mL/min (by C-G formula based on Cr of 2.11).   Medications:  Infusions:  . sodium chloride 75 mL/hr at 04/29/14 0615  . heparin 1,800 Units/hr (04/29/14 0233)    Assessment: 64 yoM on heparin for NSTEMI. Heparin level therapeutic (0.5) on 1800 units/hr. CBC stable. Plan for cath today.  Goal of Therapy: Heparin level 0.3-0.7 units/ml Monitor platelets by anticoagulation protocol: Yes   Plan:  Continue heparin IV infusion at 1800 units/hr F/u post cath  Christoper Fabianaron Izeah Vossler, PharmD, BCPS Clinical  pharmacist, pager [phone] 04/29/2014 10:50 AM

## 2014-04-29 NOTE — Progress Notes (Signed)
 Patient Name: Justin Bartlett Date of Encounter: 04/29/2014  Primary Cardiologist: Dr. Brackbill   Principal Problem:   Acute on chronic systolic CHF (congestive heart failure) Active Problems:   Diabetes mellitus   Hypercholesterolemia   Pleural effusion   CAD (coronary artery disease)   Ischemic cardiomyopathy   Chest pain at rest   CKD (chronic kidney disease)   Carotid stenosis   Elevated troponin    SUBJECTIVE  Denies any CP or SOB.   CURRENT MEDS . antiseptic oral rinse  7 mL Mouth Rinse q12n4p  . aspirin EC  325 mg Oral Daily  . chlorhexidine  15 mL Mouth Rinse BID  . dipyridamole-aspirin  1 capsule Oral BID  . ezetimibe  10 mg Oral QHS   And  . rosuvastatin  20 mg Oral q1800  . hydrALAZINE  10 mg Oral TID  . insulin aspart  0-15 Units Subcutaneous 4 times per day  . metoprolol  100 mg Oral BID  . sodium bicarbonate   Intravenous Pre-Cath   And  . sodium bicarbonate (CIN) infusion   Intravenous Pre-Cath   And  . sodium bicarbonate (CIN) infusion   Intravenous Pre-Cath  . sodium chloride  3 mL Intravenous Q12H    OBJECTIVE  Filed Vitals:   04/28/14 2034 04/28/14 2230 04/29/14 0210 04/29/14 0500  BP: 139/48 129/44 120/52 138/56  Pulse: 57 60 62 75  Temp: 97.7 F (36.5 C)  97.8 F (36.6 C) 98.4 F (36.9 C)  TempSrc: Oral  Oral Oral  Resp: 19  20 20  Height:      Weight:    289 lb 14.5 oz (131.5 kg)  SpO2: 95%  96% 98%    Intake/Output Summary (Last 24 hours) at 04/29/14 0920 Last data filed at 04/29/14 0500  Gross per 24 hour  Intake    720 ml  Output   1950 ml  Net  -1230 ml   Filed Weights   04/28/14 0608 04/28/14 1309 04/29/14 0500  Weight: 291 lb 14.2 oz (132.4 kg) 291 lb 0.1 oz (132 kg) 289 lb 14.5 oz (131.5 kg)    PHYSICAL EXAM  General: Pleasant, NAD. Neuro: Alert and oriented X 3. Moves all extremities spontaneously. Psych: Normal affect. HEENT:  Normal  Neck: Supple without bruits or JVD. Lungs:  Resp regular and unlabored.  Mildly decreased breath sound in R base which was the side he was laying on. Heart: RRR no s3, s4, or murmurs. Abdomen: Soft, non-tender, non-distended, BS + x 4.  Extremities: No clubbing, cyanosis. DP/PT/Radials 2+ and equal bilaterally. 1+ edema on RLE which is the dependent side, no significant edema in LLE  Accessory Clinical Findings  CBC  Recent Labs  04/27/14 1552  04/28/14 0841 04/29/14 0107  WBC 12.0*  < > 13.0* 11.2*  NEUTROABS 9.2*  --  10.2*  --   HGB 14.5  < > 14.3 13.0  HCT 47.6  < > 48.3 43.3  MCV 93.3  < > 94.0 91.7  PLT 210  < > 202 198  < > = values in this interval not displayed. Basic Metabolic Panel  Recent Labs  04/29/14 0107 04/29/14 0516  NA 140 141  K 5.0 4.4  CL 103 102  CO2 29 31  GLUCOSE 178* 154*  BUN 41* 44*  CREATININE 2.19* 2.11*  CALCIUM 8.1* 8.2*   Liver Function Tests  Recent Labs  04/28/14 0841  AST 22  ALT 22  ALKPHOS 96  BILITOT 0.5    PROT 6.6  ALBUMIN 3.1*   Cardiac Enzymes  Recent Labs  04/27/14 2047 04/28/14 0100 04/28/14 0722 04/28/14 1706  CKTOTAL 121  --   --   --   CKMB 8.7*  --   --   --   TROPONINI 1.48* 1.78* 0.79* 0.48*   Hemoglobin A1C  Recent Labs  04/28/14 0100  HGBA1C 9.2*    TELE NSR with HR 50-60s    ECG  No new EKG  Echocardiogram  pending    Radiology/Studies  Dg Chest 2 View  04/23/2014   CLINICAL DATA:  Two days of dyspnea on exertion without chest pain; history of previous CABG  EXAM: CHEST  2 VIEW  COMPARISON:  PA and lateral chest x-ray of January 16, 2012  FINDINGS: The right hemidiaphragm remains higher than the left. There is a new small right pleural effusion. The pulmonary interstitial markings are increased bilaterally. The pulmonary vascularity is engorged and indistinct. The cardiac silhouette is normal in size. The patient has undergone previous CABG.  IMPRESSION: CHF with small right pleural effusion and bilateral interstitial edema.   Electronically Signed    By: David  Jordan   On: 04/23/2014 12:20   Dg Chest Port 1 View  04/27/2014   CLINICAL DATA:  64-year-old male with shortness of breath with exertion. Initial encounter.  EXAM: PORTABLE CHEST - 1 VIEW  COMPARISON:  04/23/2014 and earlier.  FINDINGS: Two frontal views of the chest, 1 semi upright AP view. Continued confluent opacity at the right lung base, mildly improved since 04/23/2014. No associated air bronchograms. Stable cardiomegaly and mediastinal contours. No pneumothorax, pulmonary edema, left pleural effusion, or new pulmonary opacity identified.  IMPRESSION: Mildly improved ventilation at the right lung base since 04/23/2014. The residual confluent opacity could reflect effusion plus atelectasis or pneumonia.   Electronically Signed   By: H  Hall M.D.   On: 04/27/2014 16:05    ASSESSMENT AND PLAN  1. NSTEMI  - pending cardiac catheterization today, risk and benefit of the procedure include bleeding, vascular/renal injury, arrhythmia, MI, stroke, loss of life or limb explained, patient and his wife displayed clear understanding and agree to proceed  - likely can reduce his 325mg ASA to low dose  2. Acute on chronic systolic HF  - s/p IV diuresis. Currently off lasix pending hydration.    3. CAD s/p 5v CABG 2001 LIMA-LAD, SeqSVG-OM-dCx, [id number]-[id number]  4. CKD stage III/IV 5. DM  Signed, Meng, Hao PA-C Pager: [phone]  I have seen and examined the patient along with Meng, Hao PA-C.  I have reviewed the chart, notes and new data.  I agree with PA's note.  Key new complaints: no angina, no dyspnea lying completely flat Key examination changes: no S3, no JVD, no edema Key new findings / data: typical "rise and fall" troponin c/w small NSTEMI ne PLAN: Cardiac cath today. This procedure has been fully reviewed with the patient and written informed consent has been obtained. Particular emphasis on risk of contrast nephrotoxicity. May need staged PCI if large volume of contrast is  necessary for diagnostic images. IV NaHCO3 drip. On IV heparin  Demoni Parmar, MD, FACC [facility] [phone] 04/29/2014, 9:40 AM  

## 2014-04-29 NOTE — H&P (View-Only) (Signed)
Patient Name: Justin Bartlett Date of Encounter: 04/29/2014  Primary Cardiologist: Dr. Patty SermonsBrackbill   Principal Problem:   Acute on chronic systolic CHF (congestive heart failure) Active Problems:   Diabetes mellitus   Hypercholesterolemia   Pleural effusion   CAD (coronary artery disease)   Ischemic cardiomyopathy   Chest pain at rest   CKD (chronic kidney disease)   Carotid stenosis   Elevated troponin    SUBJECTIVE  Denies any CP or SOB.   CURRENT MEDS . antiseptic oral rinse  7 mL Mouth Rinse q12n4p  . aspirin EC  325 mg Oral Daily  . chlorhexidine  15 mL Mouth Rinse BID  . dipyridamole-aspirin  1 capsule Oral BID  . ezetimibe  10 mg Oral QHS   And  . rosuvastatin  20 mg Oral q1800  . hydrALAZINE  10 mg Oral TID  . insulin aspart  0-15 Units Subcutaneous 4 times per day  . metoprolol  100 mg Oral BID  . sodium bicarbonate   Intravenous Pre-Cath   And  . sodium bicarbonate (CIN) infusion   Intravenous Pre-Cath   And  . sodium bicarbonate (CIN) infusion   Intravenous Pre-Cath  . sodium chloride  3 mL Intravenous Q12H    OBJECTIVE  Filed Vitals:   04/28/14 2034 04/28/14 2230 04/29/14 0210 04/29/14 0500  BP: 139/48 129/44 120/52 138/56  Pulse: 57 60 62 75  Temp: 97.7 F (36.5 C)  97.8 F (36.6 C) 98.4 F (36.9 C)  TempSrc: Oral  Oral Oral  Resp: 19  20 20   Height:      Weight:    289 lb 14.5 oz (131.5 kg)  SpO2: 95%  96% 98%    Intake/Output Summary (Last 24 hours) at 04/29/14 0920 Last data filed at 04/29/14 0500  Gross per 24 hour  Intake    720 ml  Output   1950 ml  Net  -1230 ml   Filed Weights   04/28/14 0608 04/28/14 1309 04/29/14 0500  Weight: 291 lb 14.2 oz (132.4 kg) 291 lb 0.1 oz (132 kg) 289 lb 14.5 oz (131.5 kg)    PHYSICAL EXAM  General: Pleasant, NAD. Neuro: Alert and oriented X 3. Moves all extremities spontaneously. Psych: Normal affect. HEENT:  Normal  Neck: Supple without bruits or JVD. Lungs:  Resp regular and unlabored.  Mildly decreased breath sound in R base which was the side he was laying on. Heart: RRR no s3, s4, or murmurs. Abdomen: Soft, non-tender, non-distended, BS + x 4.  Extremities: No clubbing, cyanosis. DP/PT/Radials 2+ and equal bilaterally. 1+ edema on RLE which is the dependent side, no significant edema in LLE  Accessory Clinical Findings  CBC  Recent Labs  04/27/14 1552  04/28/14 0841 04/29/14 0107  WBC 12.0*  < > 13.0* 11.2*  NEUTROABS 9.2*  --  10.2*  --   HGB 14.5  < > 14.3 13.0  HCT 47.6  < > 48.3 43.3  MCV 93.3  < > 94.0 91.7  PLT 210  < > 202 198  < > = values in this interval not displayed. Basic Metabolic Panel  Recent Labs  04/29/14 0107 04/29/14 0516  NA 140 141  K 5.0 4.4  CL 103 102  CO2 29 31  GLUCOSE 178* 154*  BUN 41* 44*  CREATININE 2.19* 2.11*  CALCIUM 8.1* 8.2*   Liver Function Tests  Recent Labs  04/28/14 0841  AST 22  ALT 22  ALKPHOS 96  BILITOT 0.5  PROT 6.6  ALBUMIN 3.1*   Cardiac Enzymes  Recent Labs  04/27/14 2047 04/28/14 0100 04/28/14 0722 04/28/14 1706  CKTOTAL 121  --   --   --   CKMB 8.7*  --   --   --   TROPONINI 1.48* 1.78* 0.79* 0.48*   Hemoglobin A1C  Recent Labs  04/28/14 0100  HGBA1C 9.2*    TELE NSR with HR 50-60s    ECG  No new EKG  Echocardiogram  pending    Radiology/Studies  Dg Chest 2 View  04/23/2014   CLINICAL DATA:  Two days of dyspnea on exertion without chest pain; history of previous CABG  EXAM: CHEST  2 VIEW  COMPARISON:  PA and lateral chest x-ray of January 16, 2012  FINDINGS: The right hemidiaphragm remains higher than the left. There is a new small right pleural effusion. The pulmonary interstitial markings are increased bilaterally. The pulmonary vascularity is engorged and indistinct. The cardiac silhouette is normal in size. The patient has undergone previous CABG.  IMPRESSION: CHF with small right pleural effusion and bilateral interstitial edema.   Electronically Signed    By: David  [place]   On: 04/23/2014 12:20   Dg Chest Port 1 View  04/27/2014   CLINICAL DATA:  64 year old male with shortness of breath with exertion. Initial encounter.  EXAM: PORTABLE CHEST - 1 VIEW  COMPARISON:  04/23/2014 and earlier.  FINDINGS: Two frontal views of the chest, 1 semi upright AP view. Continued confluent opacity at the right lung base, mildly improved since 04/23/2014. No associated air bronchograms. Stable cardiomegaly and mediastinal contours. No pneumothorax, pulmonary edema, left pleural effusion, or new pulmonary opacity identified.  IMPRESSION: Mildly improved ventilation at the right lung base since 04/23/2014. The residual confluent opacity could reflect effusion plus atelectasis or pneumonia.   Electronically Signed   By: Odessa Fleming M.D.   On: 04/27/2014 16:05    ASSESSMENT AND PLAN  1. NSTEMI  - pending cardiac catheterization today, risk and benefit of the procedure include bleeding, vascular/renal injury, arrhythmia, MI, stroke, loss of life or limb explained, patient and his wife displayed clear understanding and agree to proceed  - likely can reduce his  ASA to low dose  2. Acute on chronic systolic HF  - s/p IV diuresis. Currently off lasix pending hydration.    3. CAD s/p 5v CABG 2001 LIMA-LAD, SeqSVG-OM-dCx, SeqSVG-rVM-rPDA  4. CKD stage III/IV 5. DM  Signed, Amedeo Plenty Pager: [phone]  I have seen and examined the patient along with Azalee Course PA-C.  I have reviewed the chart, notes and new data.  I agree with PA's note.  Key new complaints: no angina, no dyspnea lying completely flat Key examination changes: no S3, no JVD, no edema Key new findings / data: typical "rise and fall" troponin c/w small NSTEMI ne PLAN: Cardiac cath today. This procedure has been fully reviewed with the patient and written informed consent has been obtained. Particular emphasis on risk of contrast nephrotoxicity. May need staged PCI if large volume of contrast is  necessary for diagnostic images. IV NaHCO3 drip. On IV heparin  Thurmon Fair, MD, [facility] [phone] 04/29/2014, 9:40 AM

## 2014-04-29 NOTE — Progress Notes (Addendum)
[facility] PROGRESS NOTE  Justin Bartlett ZOX:[id number] DOB: 08-19-1950 DOA: 04/27/2014 PCP: Thora Lance, MD  Brief Summary  64 -year-old male with known past medical history of ischemic heart disease, anteroseptal myocardial infarction in 2001 status post CABG at that time, history of hypertension, diabetes, dyslipidemia, obesity, chronic kidney disease stage IV who presented to [facility] ED with worsening left-sided chest pain which started one day prior to the admission.  A few days prior he was seen by his PCP for SOB who performed CXR which demonstrated "fluid" and he was started on lasix which did not improve his SOB.  His breathing worsened and on Sunday night, he developed chest pain.  Chest pain was 8 out of 10 in intensity, substernal, nonradiating, worse with exertion but it was present at rest. Chest pain awoke him from sleep and spontaneously resolved within 30 minutes on its own.  He came to the ER.   On admission, VSS except for initial oxygen saturation 89% on room air which improved to 97% with nasal cannula. Blood work was notable for mild leukocytosis of 12.0, creatinine of 2.29, troponin of 1.48 which peaked at 1.78, and potassium of 6. No acute ischemic changes were identified on 12-lead EKG. Cardiology has seen the patient in consultation for NSTEMI.  Plan is for cardiac catheterization today.  Assessment/Plan  Acute on chronic systolic CHF (congestive heart failure) / acute respiratory failure with hypoxia -  BNP 300 -  Last 2-D echo on file in 2011 with ejection fraction of 40% -  Hold lasix this morning in preparation for catheterization - I/O:  -1.2L - Appreciate cardiology assistance  NSTEMI - Troponin level 1.48 --> 1.78 --> 0.79. No acute ischemic changes on 12-lead EKG. - Patient was started on heparin drip - Continue Aggrenox PO BID, metoprolol, and high dose statin - Cardiac catheterization   Right-sided pleural effusion and infiltrate / acute respiratory  failure with hypoxia - Respiratory status improving.  -  Chest x-ray shows residual RLL confluent opacity from 04/23/2014 study which could reflect effusion, atelectasis or pneumonia. - No reports of fevers at home. Mild elevation in white blood cell count likely reactive. - Strep pneumonia is negative. Legionella is negative - Blood cultures are NGTD - Antibiotics deferred for now -  Start incentive spirometry  Diabetes mellitus with renal manifestations, CBG well controlled here, but uncontrolled at baseline - continue moderate dose SSI - Check A1c 9.2  Essential hypertension - Continue hydralazine, metoprolol -  Hold lasix in preparation for cath  Chronic kidney disease stage IV, creatinine near baselien - on sodium bicarb fluids in preparation for cath today -  Renally dose medications -  Minimize contrast exposure if possible  Dyslipidemia - Continue Crestor 20 mg daily and Zetia 10 mg at bedtime  Diet:  NPO Access:  PIV IVF:  yes Proph:  Heparin gtt  Code Status: full Family Communication: patient and wife Disposition Plan: pending catheterization and further diuresis   Consultants:  Cardiology  Procedures:  CXR  Antibiotics:  none   HPI/Subjective: SOB improved on oxygen.  Denies further chest pains or fluttering.  Denies nausea, vomiting.    Objective: Filed Vitals:   04/28/14 2230 04/29/14 0210 04/29/14 0500 04/29/14 0945  BP: 129/44 120/52 138/56 110/50  Pulse: 60 62 75 70  Temp:  97.8 F (36.6 C) 98.4 F (36.9 C)   TempSrc:  Oral Oral   Resp:  20 20   Height:      Weight:  131.5 kg (289 lb 14.5 oz)   SpO2:  96% 98%     Intake/Output Summary (Last 24 hours) at 04/29/14 1127 Last data filed at 04/29/14 0949  Gross per 24 hour  Intake    720 ml  Output   1725 ml  Net  -1005 ml   Filed Weights   04/28/14 0608 04/28/14 1309 04/29/14 0500  Weight: 132.4 kg (291 lb 14.2 oz) 132 kg (291 lb 0.1 oz) 131.5 kg (289 lb 14.5 oz)     Exam:   General:  Obese male, No acute distress  HEENT:  NCAT, MMM  Cardiovascular:  RRR, nl S1, S2 no mrg, 2+ pulses, warm extremities  Respiratory:  Diminished right base with rales at mid-back, no wheezes or rhonchi, left side is clear, no increased WOB  Abdomen:   NABS, soft, NT/ND  MSK:   Normal tone and bulk, no LEE  Neuro:  Grossly intact  Data Reviewed: Basic Metabolic Panel:  Recent Labs Lab 04/26/14 1149 04/27/14 1552 04/28/14 0841 04/29/14 0107 04/29/14 0516  NA 142 140 141 140 141  K 5.1 4.4 6.0* 5.0 4.4  CL 108 104 103 103 102  CO2 GLUCOSE 72 185* 147* 178* 154*  BUN 41* 43* 42* 41* 44*  CREATININE 2.29* 2.04* 2.26* 2.19* 2.11*  CALCIUM 9.2 8.4 8.2* 8.1* 8.2*   Liver Function Tests:  Recent Labs Lab 04/28/14 0841  AST 22  ALT 22  ALKPHOS 96  BILITOT 0.5  PROT 6.6  ALBUMIN 3.1*   No results for input(s): LIPASE, AMYLASE in the last 168 hours. No results for input(s): AMMONIA in the last 168 hours. CBC:  Recent Labs Lab 04/27/14 1552 04/28/14 0100 04/28/14 0841 04/29/14 0107  WBC 12.0* 12.7* 13.0* 11.2*  NEUTROABS 9.2*  --  10.2*  --   HGB 14.5 13.3 14.3 13.0  HCT 47.6 44.6 48.3 43.3  MCV 93.3 92.7 94.0 91.7  PLT 210 217 202 198   Cardiac Enzymes:  Recent Labs Lab 04/27/14 1552 04/27/14 2047 04/28/14 0100 04/28/14 0722 04/28/14 1706  CKTOTAL  --  121  --   --   --   CKMB  --  8.7*  --   --   --   TROPONINI 0.05* 1.48* 1.78* 0.79* 0.48*   BNP (last 3 results)  Recent Labs  04/27/14 1552  BNP 325.5*    ProBNP (last 3 results)  Recent Labs  04/26/14 1149  PROBNP 241.0*    CBG:  Recent Labs Lab 04/28/14 0127 04/28/14 0607 04/28/14 1650 04/29/14 0013 04/29/14 0616  GLUCAP 134* 94 138* 168* 131*    Recent Results (from the past 240 hour(s))  Culture, blood (routine x 2)     Status: None (Preliminary result)   Collection Time: 04/27/14  8:38 PM  Result Value Ref Range Status    Specimen Description BLOOD RIGHT HAND  Final   Special Requests BOTTLES DRAWN AEROBIC AND ANAEROBIC  Final   Culture   Final           BLOOD CULTURE RECEIVED NO GROWTH TO DATE CULTURE WILL BE HELD FOR 5 DAYS BEFORE ISSUING A FINAL NEGATIVE REPORT Performed at [facility]    Report Status PENDING  Incomplete  Culture, blood (routine x 2)     Status: None (Preliminary result)   Collection Time: 04/27/14  8:47 PM  Result Value Ref Range Status   Specimen Description BLOOD LEFT WRIST  Final  Special Requests BOTTLES DRAWN AEROBIC AND ANAEROBIC 5ML  Final   Culture   Final           BLOOD CULTURE RECEIVED NO GROWTH TO DATE CULTURE WILL BE HELD FOR 5 DAYS BEFORE ISSUING A FINAL NEGATIVE REPORT [facility]    Report Status PENDING  Incomplete     Studies: Dg Chest Port 1 View  04/27/2014   CLINICAL DATA:  64 year old male with shortness of breath with exertion. Initial encounter.  EXAM: PORTABLE CHEST - 1 VIEW  COMPARISON:  04/23/2014 and earlier.  FINDINGS: Two frontal views of the chest, 1 semi upright AP view. Continued confluent opacity at the right lung base, mildly improved since 04/23/2014. No associated air bronchograms. Stable cardiomegaly and mediastinal contours. No pneumothorax, pulmonary edema, left pleural effusion, or new pulmonary opacity identified.  IMPRESSION: Mildly improved ventilation at the right lung base since 04/23/2014. The residual confluent opacity could reflect effusion plus atelectasis or pneumonia.   Electronically Signed   By: Odessa FlemingH  Hall M.D.   On: 04/27/2014 16:05    Scheduled Meds: . antiseptic oral rinse  7 mL Mouth Rinse q12n4p  . aspirin EC  325 mg Oral Daily  . chlorhexidine  15 mL Mouth Rinse BID  . dipyridamole-aspirin  1 capsule Oral BID  . ezetimibe  10 mg Oral QHS   And  . rosuvastatin  20 mg Oral q1800  . hydrALAZINE  10 mg Oral TID  . insulin aspart  0-15 Units Subcutaneous 4 times per day  . metoprolol  100 mg  Oral BID  . sodium bicarbonate   Intravenous Pre-Cath   And  . sodium bicarbonate (CIN) infusion   Intravenous Pre-Cath  . sodium chloride  3 mL Intravenous Q12H   Continuous Infusions: . sodium chloride 75 mL/hr at 04/29/14 0615  . heparin 1,800 Units/hr (04/29/14 0233)    Principal Problem:   Acute on chronic systolic CHF (congestive heart failure) Active Problems:   Diabetes mellitus   Hypercholesterolemia   Pleural effusion   CAD (coronary artery disease)   Ischemic cardiomyopathy   Chest pain at rest   CKD (chronic kidney disease)   Carotid stenosis   Elevated troponin    Time spent: 30 min    Cecila Satcher, [facility]  [facility] Pager [phone]. If 7PM-7AM, please contact night-coverage at [email or web address], password [email or web address] 04/29/2014, 11:27 AM  LOS: 2 days

## 2014-04-29 NOTE — Care Management Note (Signed)
    Page 1 of 1   05/04/2014     4:05:55 PM CARE MANAGEMENT NOTE 05/04/2014  Patient:  Justin Bartlett,Justin Bartlett   Account Number:  [id number]  Date Initiated:  04/29/2014  Documentation initiated by:  Anhad Sheeley  Subjective/Objective Assessment:   Pt adm on 04/27/14 with NSTEMi, resp failure related to CHF. PTA, pt resided at home with spouse.     Action/Plan:   Will follow for dc needs as pt progresses.   Anticipated DC Date:  05/02/2014   Anticipated DC Plan:  HOME W HOME HEALTH SERVICES      DC Planning Services  CM consult      Choice offered to / List presented to:     DME arranged  [facility]      DME agency  [facility]        Status of service:  Completed, signed off Medicare Important Message given?  NO (If response is "NO", the following Medicare IM given date fields will be blank) Date Medicare IM given:   Medicare IM given by:   Date Additional Medicare IM given:   Additional Medicare IM given by:    Discharge Disposition:  HOME/SELF CARE  Per UR Regulation:  Reviewed for med. necessity/level of care/duration of stay  If discussed at Long Length of Stay Meetings, dates discussed:   05/04/2014    Comments:  05/03/14 Justin GentaJulie Amerosn, RN, BSN [phone] Pt cont to desat with ambulation, and will need home O2. [facility].  Portable tank to be delivered to pt prior to dc.

## 2014-04-29 NOTE — CV Procedure (Signed)
CARDIAC CATHETERIZATION REPORT  NAME:  Justin HighlandHenry L Migues   MRN: [id number] DOB:  01/30/1951   ADMIT DATE: 04/27/2014 Procedure Date: 04/29/2014  INTERVENTIONAL CARDIOLOGIST: Marykay Lexavid W Roniesha Hollingshead, M.D., MS PRIMARY CARE PROVIDER: Thora LanceEHINGER,ROBERT R, MD PRIMARY CARDIOLOGIST:  Cassell Clementhomas Brackbill, M.D.  PATIENT:  Justin Bartlett is a 64 y.o. male with distant history of CABG 5 in 2001. He has not had any further evaluation from an invasive standpoint since then. He was admitted to the hospital with symptoms of potential unstable angina/acute cord syndrome and has ruled in for non-ST elevation MI. He is now referred for invasive evaluation with catheterization after adequate hydration.  PRE-OPERATIVE DIAGNOSIS:    Non-STEMI  Acute on Chronic Combined Systolic and Diastolic Heart Failure  PROCEDURES PERFORMED:    Left Heart Catheterization with Native Coronary And Graft Angiography  via Right Common Femoral Artery Access  Left Ventriculography  PROCEDURE: The patient was brought to the 2nd Floor [place] [facility] in the fasting state and prepped and draped in the usual sterile fashion for right common femoral artery access.  Sterile technique was used including antiseptics, cap, gloves, gown, hand hygiene, mask and sheet. Skin prep: Chlorhexidine.   Consent: Risks of procedure as well as the alternatives and risks of each were explained to the (patient/caregiver). Consent for procedure obtained.   Time Out: Verified patient identification, verified procedure, site/side was marked, verified correct patient position, special equipment/implants available, medications/allergies/relevent history reviewed, required imaging and test results available. Performed.  Access:   Right Common Femoral Artery: 5 Fr Sheath -  fluoroscopically guided modified Seldinger Technique; a Versicore wire was used to advance through the short segment of calcified external iliac artery.  Left Heart  Catheterization: 5 Fr Catheters advanced or exchanged over a standard J-wire; JL4 catheter advanced first.  Left Coronary Artery Cineangiography: JL4 Catheter  Right Coronary Artery  & SVG-OM Cineangiography: JR4 Catheter  LIMA-LAD : JR4 Catheter redirected into Left Subclavian Artery & advanced over the Versicore wire. SVG-RPVM -RPDA Cineangiography: 5 FrAR-1  LV Hemodynamics: Angled pigtail  Sheath removed in the PACU holding area with manual pressure for hemostasis.   FINDINGS:  Hemodynamics:   Central Aortic Pressure / Mean: 177/76/117 mmHg  Left Ventricular Pressure / LVEDP: 177/17/34 mmHg  Left Ventriculography: Deferred  Coronary Anatomy:  Dominance: Likely right  Left Main: Moderate caliber vessel that essentially continues as the circumflex. The LAD is 100% occluded at the left main. LIMA-LAD: Widely patent graft to the mid LAD. The distal LAD beyond the graft is a very small caliber vessel that reaches down to the apex in tapering fashion. There is also retrograde flow proximally to the occlusion site this provides flow to a significant diagonal branch. No significant disease in the LAD or diagonal but small in caliber.  There are faint collaterals from septal perforators in the distal LAD to the RPDA.  Left Circumflex: Moderate caliber vessel that gives rise to 3 OM branches. The first and third branches have severe disease and appeared to be the grafted vessels. OM 3 is actually occluded. The following 1 circumflex beyond OM 3 provides mild collaterals to the posterior lateral system.  SVG-OM1-OM 3: Widely patent graft to moderate caliber OM branches. The first OM has minimal retrograde flow wears second has low back to the main circumflex and provides flow to the distal smaller circumflex. Both downstream grafted vessels are relatively free of disease.   RCA: Subtotal occlusion the very proximal segment. After a roughly 20  no longer segment there is recanalization until  just beyond the first artery marginal branch were again there is a subtotal occlusion. The vessel then again normalizes with potentially the bridging collaterals to be relatively normal in the mid segment until the crux. At about that point the vessels is totally occluded.  The RPDA and posterior lateral system are not visualized from antegrade flow. SVG-RVM-PDA: 100% occluded at the ostium. There is some mild calcification noted in the occluded graft.  MEDICATIONS:  Anesthesia:  Local Lidocaine 1 ml  Sedation:   IV Versed, 50 mcg IV fentanyl ;   Omnipaque Contrast: 140 ml  IC NTG   PATIENT DISPOSITION:    The patient was transferred to the PACU holding area in a hemodynamicaly stable, chest pain free condition.  The patient tolerated the procedure well, and there were no complications.  EBL:   < 30 ml  The patient was stable before, during, and after the procedure.  POST-OPERATIVE DIAGNOSIS:    Severe native coronary artery disease with occluded LAD and distal RCA with subtotal occlusion more proximally in the RCA. Also severe disease in the circumflex system.  Aided vessels are very small caliber.  Occluded SVG-RPL-PDA, leaving no flow to the distal RCA system with exception of collaterals from the LAD.  Widely patent LIMA-LAD to a very small caliber distal LAD with retrograde filling to a diagonal branch. Also widely patent sequential vein graft to 2 OM branches with minimal disease in the downstream vessels.  Severe systemic hypertension with severely elevated LVEDP of 30-34 mmHg.  No obvious culprit for positive troponins and this would suggest likely demand in the setting of hypertensive urgency / acute on chronic diastolic heart failure  PLAN OF CARE:  Return to nursing unit for standard post femoral cath care.  Monitor renal function with IV hydration. He will likely need to restart his IV diuretic based on the severely elevated EDP. -- I have written for 3 doses of IV  Lasix starting tonight.  Need titration/adjustment of heart failure medications as well as antianginals.  Would not anticipate discharge tomorrow as he has significant medication adjustments to be made.    Marykay Lex, M.D., M.S. Interventional Cardiologist   Pager # [phone]

## 2014-04-29 NOTE — Interval H&P Note (Signed)
History and Physical Interval Note:  04/29/2014 3:52 PM  Justin Bartlett  has presented today for surgery, with the diagnosis of NSTEMI The various methods of treatment have been discussed with the patient and family. After consideration of risks, benefits and other options for treatment, the patient has consented to  Procedure(s): LEFT HEART CATHETERIZATION WITH CORONARY/GRAFT ANGIOGRAM (N/A) +/- PCI as a surgical intervention .  The patient's history has been reviewed, patient examined, no change in status, stable for surgery.  I have reviewed the patient's chart and labs.  Questions were answered to the patient's satisfaction.     Justin Bartlett  Cath Lab Visit (complete for each Cath Lab visit)  Clinical Evaluation Leading to the Procedure:   ACS: Yes.    Non-ACS:    Anginal Classification: CCS III  Anti-ischemic medical therapy: Maximal Therapy (2 or more classes of medications)  Non-Invasive Test Results: No non-invasive testing performed  Prior CABG: Previous CABG  Justin Labreck W, MD  TIMI Score  Patient Information:  TIMI Score is 6  Revascularization of the presumed culprit artery  A (9)  Indication: 11; Score: 9 TIMI Score  Patient Information:  TIMI Score is 6  Revascularization of multiple coronary arteries when the culprit artery cannot clearly be determined  A (9)  Indication: 12; Score: 9

## 2014-04-29 NOTE — Progress Notes (Addendum)
ANTICOAGULATION CONSULT NOTE - Follow Up Consult  Pharmacy Consult for Heparin Indication: chest pain/ACS  Allergies  Allergen Reactions  . Actos [Pioglitazone] Other (See Comments)    Elevated kidney function  . Hctz [Hydrochlorothiazide] Other (See Comments)    BP issues.     Patient Measurements: Height:  (180.3 cm) Weight: 291 lb 0.1 oz (132 kg) IBW/kg (Calculated) : 75.3 Heparin Dosing Weight: 105 kg  Vital Signs: Temp: 97.7 F (36.5 C) (03/23 2034) Temp Source: Oral (03/23 2034) BP: 129/44 mmHg (03/23 2230) Pulse Rate: 60 (03/23 2230)  Labs:  Recent Labs  04/26/14 1149  04/27/14 1552 04/27/14 2047 04/28/14 0100 04/28/14 0722 04/28/14 0841 04/28/14 1706 04/29/14 0107  HGB  --   < > 14.5  --  13.3  --  14.3  --  13.0  HCT  --   < > 47.6  --  44.6  --  48.3  --  43.3  PLT  --   < > 210  --  217  --  202  --  198  LABPROT  --   --  14.8  --   --   --  15.4*  --   --   INR  --   --  1.15  --   --   --  1.20  --   --   HEPARINUNFRC  --   --   --   --   --   --  0.11* 0.16* 0.53  CREATININE 2.29*  --  2.04*  --   --   --  2.26*  --   --   CKTOTAL  --   --   --  121  --   --   --   --   --   CKMB  --   --   --  8.7*  --   --   --   --   --   TROPONINI  --   < > 0.05* 1.48* 1.78* 0.79*  --  0.48*  --   < > = values in this interval not displayed.  Estimated Creatinine Clearance: 45.8 mL/min (by C-G formula based on Cr of 2.26).   Medications:  Infusions:  . heparin 1,800 Units/hr (04/28/14 1831)    Assessment: 25 yoM presented to [facility] ED on 3/22 with chest pain and SOB.  PMH includes CAD s/p 5 vessel CABG, HLD, HTN, CKD, stroke, DM, edema, and stomach ulcers.  Cardiology was consulted, with positive troponins and possible NSTEMI.  Transfer to [facility] for tentative Cath on 3/24 is planned.  Pharmacy was consulted to dose IV Heparin.  6 hr heparin level 0.53 after bolus of 3000 units and increase to 1800 units/hr. CBC is wnl. No bleeding reported.   Goal of  Therapy: Heparin level 0.3-0.7 units/ml Monitor platelets by anticoagulation protocol: Yes   Plan:  Continue heparin IV infusion 1800 units/hr F/u 6h HL to confirm. Daily heparin level and CBC Continue to monitor H&H and platelets  Justin Bartlett, RPh Clinical Pharmacist Pager: [phone] 04/29/2014 2:03 AM   Addendum:  Pharmacy consult for sodium bicarbonate IV infusion for prevention of contrast induced nephropathy (CIN)  For cardiac cath 3/24 AM.  Weight 132 KG  Plan: In am begin NaBicarb 150 mEq /D5W IV infusion to begin at 376ml/hr x 1st hour then decrease to 120 ml/hr.  Continue this delivery rate prior to and during the procedure as well as for 6 hours following the procedure.  Justin Bartlett  Chestine Sporelark, RPh Clinical Pharmacist Pager: [phone] 04/29/2014, 03:43 AM

## 2014-04-29 NOTE — Progress Notes (Signed)
O2 sat 71% on RA, pt in no apparent distress. Pt placed on 4Lnc up to 100%, now O2 sat 100% on 2Lnc. Will continue to monitor. Huel Coventryosenberger, Aurilla Coulibaly A, RN

## 2014-04-29 NOTE — Progress Notes (Signed)
Site area: rfa Site Prior to Removal:  Level 0 Pressure Applied For:5025min Manual:   yes Patient Status During Pull:  stable Post Pull Site:  Level Post Pull Instructions Given:yes  Post Pull Pulses Present:palpable  Dressing Applied:  clear Bedrest begins @1810   Comments:

## 2014-04-30 DIAGNOSIS — I509 Heart failure, unspecified: Secondary | ICD-10-CM

## 2014-04-30 DIAGNOSIS — I25110 Atherosclerotic heart disease of native coronary artery with unstable angina pectoris: Secondary | ICD-10-CM

## 2014-04-30 DIAGNOSIS — I5043 Acute on chronic combined systolic (congestive) and diastolic (congestive) heart failure: Principal | ICD-10-CM

## 2014-04-30 LAB — GLUCOSE, CAPILLARY
GLUCOSE-CAPILLARY: 135 mg/dL — AB (ref 70–99)
GLUCOSE-CAPILLARY: 155 mg/dL — AB (ref 70–99)
GLUCOSE-CAPILLARY: 205 mg/dL — AB (ref 70–99)
Glucose-Capillary: 163 mg/dL — ABNORMAL HIGH (ref 70–99)
Glucose-Capillary: 177 mg/dL — ABNORMAL HIGH (ref 70–99)

## 2014-04-30 LAB — BASIC METABOLIC PANEL
ANION GAP: 9 (ref 5–15)
BUN: 31 mg/dL — ABNORMAL HIGH (ref 6–23)
CALCIUM: 8.2 mg/dL — AB (ref 8.4–10.5)
CHLORIDE: 96 mmol/L (ref 96–112)
CO2: 34 mmol/L — ABNORMAL HIGH (ref 19–32)
Creatinine, Ser: 1.85 mg/dL — ABNORMAL HIGH (ref 0.50–1.35)
GFR calc Af Amer: 43 mL/min — ABNORMAL LOW (ref >90)
GFR calc non Af Amer: 37 mL/min — ABNORMAL LOW (ref >90)
Glucose, Bld: 138 mg/dL — ABNORMAL HIGH (ref 70–99)
Potassium: 4.1 mmol/L (ref 3.5–5.1)
Sodium: 139 mmol/L (ref 135–145)

## 2014-04-30 LAB — CBC
HEMATOCRIT: 41.1 % (ref 39.0–52.0)
Hemoglobin: 12.4 g/dL — ABNORMAL LOW (ref 13.0–17.0)
MCH: 27.9 pg (ref 26.0–34.0)
MCHC: 30.2 g/dL (ref 30.0–36.0)
MCV: 92.6 fL (ref 78.0–100.0)
PLATELETS: 188 10*3/uL (ref 150–400)
RBC: 4.44 MIL/uL (ref 4.22–5.81)
RDW: 15.3 % (ref 11.5–15.5)
WBC: 10 10*3/uL (ref 4.0–10.5)

## 2014-04-30 MED ORDER — HYDRALAZINE HCL 25 MG PO TABS
25.0000 mg | ORAL_TABLET | Freq: Three times a day (TID) | ORAL | Status: DC
  Administered 2014-04-30 – 2014-05-01 (×3): 25 mg via ORAL
  Filled 2014-04-30 (×5): qty 1

## 2014-04-30 MED ORDER — ISOSORBIDE MONONITRATE ER 30 MG PO TB24
30.0000 mg | ORAL_TABLET | Freq: Every day | ORAL | Status: DC
  Administered 2014-04-30 – 2014-05-01 (×2): 30 mg via ORAL
  Filled 2014-04-30 (×2): qty 1

## 2014-04-30 MED ORDER — FUROSEMIDE 10 MG/ML IJ SOLN
80.0000 mg | Freq: Two times a day (BID) | INTRAMUSCULAR | Status: DC
  Administered 2014-05-01: 80 mg via INTRAVENOUS
  Filled 2014-04-30 (×3): qty 8

## 2014-04-30 MED ORDER — ISOSORBIDE MONONITRATE 15 MG HALF TABLET
15.0000 mg | ORAL_TABLET | Freq: Every day | ORAL | Status: DC
  Filled 2014-04-30: qty 1

## 2014-04-30 MED ORDER — FUROSEMIDE 10 MG/ML IJ SOLN: 40.0000 mg | Freq: Two times a day (BID) | INTRAMUSCULAR | Status: DC

## 2014-04-30 MED ORDER — ISOSORBIDE MONONITRATE ER 30 MG PO TB24: 30.0000 mg | ORAL_TABLET | Freq: Every day | ORAL | Status: DC

## 2014-04-30 NOTE — Progress Notes (Signed)
  Echocardiogram 2D Echocardiogram has been performed.  Arvil ChacoFoster, Sejal Cofield 04/30/2014, 11:03 AM

## 2014-04-30 NOTE — Progress Notes (Signed)
Pt resting comfortably in bed this AM, no complaints of pain. Cath site to right groin level 0 at this time. Pt OOB to be weighed with 1 assist. Will continue to monitor. Huel Coventryosenberger, Onyx Schirmer A, RN

## 2014-04-30 NOTE — Progress Notes (Signed)
I cosign all documentation and medication administration by Komicia Jeffries, student RN for this shift.            

## 2014-04-30 NOTE — Progress Notes (Signed)
Patient Name: Justin Bartlett Date of Encounter: 04/30/2014  Primary Cardiologist: Dr. Patty Sermons   Principal Problem:   Acute on chronic combined systolic and diastolic CHF (congestive heart failure) Active Problems:   Diabetes mellitus   Hypercholesterolemia   Pleural effusion   CAD (coronary artery disease)   Ischemic cardiomyopathy   Chest pain at rest   CKD (chronic kidney disease)   Carotid stenosis   Elevated troponin   SOB (shortness of breath)    SUBJECTIVE  Denies any CP or SOB. Feels good. Denies ever been placed on imdur.   CURRENT MEDS . antiseptic oral rinse  7 mL Mouth Rinse q12n4p  . aspirin EC  325 mg Oral Daily  . chlorhexidine  15 mL Mouth Rinse BID  . dipyridamole-aspirin  1 capsule Oral BID  . ezetimibe  10 mg Oral QHS   And  . rosuvastatin  20 mg Oral q1800  . furosemide  60 mg Intravenous BID  . heparin  5,000 Units Subcutaneous 3 times per day  . hydrALAZINE  10 mg Oral TID  . insulin aspart  0-15 Units Subcutaneous 4 times per day  . metoprolol  100 mg Oral BID  . sodium chloride  3 mL Intravenous Q12H    OBJECTIVE  Filed Vitals:   04/29/14 2130 04/29/14 2200 04/30/14 0127 04/30/14 0511  BP: 166/94 134/75 123/45 141/69  Pulse: 82 81 72 71  Temp:   98.4 F (36.9 C) 98.6 F (37 C)  TempSrc:   Oral Oral  Resp:   18 18  Height:      Weight:      SpO2:   99% 94%    Intake/Output Summary (Last 24 hours) at 04/30/14 0903 Last data filed at 04/30/14 0548  Gross per 24 hour  Intake 671.35 ml  Output   2425 ml  Net -1753.65 ml   Filed Weights   04/28/14 0608 04/28/14 1309 04/29/14 0500  Weight: 291 lb 14.2 oz (132.4 kg) 291 lb 0.1 oz (132 kg) 289 lb 14.5 oz (131.5 kg)    PHYSICAL EXAM  General: Pleasant, NAD. Neuro: Alert and oriented X 3. Moves all extremities spontaneously. Psych: Normal affect. HEENT:  Normal  Neck: Supple without bruits or JVD. Lungs:  Resp regular and unlabored. CTA Heart: RRR no s3, s4, or  murmurs. Abdomen: Soft, non-tender, non-distended, BS + x 4.  Extremities: No clubbing, cyanosis. DP/PT/Radials 2+ and equal bilaterally. No LE edema  Accessory Clinical Findings  CBC  Recent Labs  04/27/14 1552  04/28/14 0841 04/29/14 0107 04/30/14 0650  WBC 12.0*  < > 13.0* 11.2* 10.0  NEUTROABS 9.2*  --  10.2*  --   --   HGB 14.5  < > 14.3 13.0 12.4*  HCT 47.6  < > 48.3 43.3 41.1  MCV 93.3  < > 94.0 91.7 92.6  PLT 210  < > 202 198 188  < > = values in this interval not displayed. Basic Metabolic Panel  Recent Labs  04/29/14 0516 04/30/14 0650  NA 141 139  K 4.4 4.1  CL 102 96  CO2 31 34*  GLUCOSE 154* 138*  BUN 44* 31*  CREATININE 2.11* 1.85*  CALCIUM 8.2* 8.2*   Liver Function Tests  Recent Labs  04/28/14 0841  AST 22  ALT 22  ALKPHOS 96  BILITOT 0.5  PROT 6.6  ALBUMIN 3.1*   Cardiac Enzymes  Recent Labs  04/27/14 2047 04/28/14 0100 04/28/14 0722 04/28/14 1706  CKTOTAL 121  --   --   --  CKMB 8.7*  --   --   --   TROPONINI 1.48* 1.78* 0.79* 0.48*   Hemoglobin A1C  Recent Labs  04/28/14 0100  HGBA1C 9.2*    TELE NSR with HR 50-60s    ECG  No new EKG  Echocardiogram  pending    Radiology/Studies  Dg Chest 2 View  04/23/2014   CLINICAL DATA:  Two days of dyspnea on exertion without chest pain; history of previous CABG  EXAM: CHEST  2 VIEW  COMPARISON:  PA and lateral chest x-ray of January 16, 2012  FINDINGS: The right hemidiaphragm remains higher than the left. There is a new small right pleural effusion. The pulmonary interstitial markings are increased bilaterally. The pulmonary vascularity is engorged and indistinct. The cardiac silhouette is normal in size. The patient has undergone previous CABG.  IMPRESSION: CHF with small right pleural effusion and bilateral interstitial edema.   Electronically Signed   By: David  SwazilandJordan   On: 04/23/2014 12:20   Dg Chest Port 1 View  04/27/2014   CLINICAL DATA:  64 year old male with  shortness of breath with exertion. Initial encounter.  EXAM: PORTABLE CHEST - 1 VIEW  COMPARISON:  04/23/2014 and earlier.  FINDINGS: Two frontal views of the chest, 1 semi upright AP view. Continued confluent opacity at the right lung base, mildly improved since 04/23/2014. No associated air bronchograms. Stable cardiomegaly and mediastinal contours. No pneumothorax, pulmonary edema, left pleural effusion, or new pulmonary opacity identified.  IMPRESSION: Mildly improved ventilation at the right lung base since 04/23/2014. The residual confluent opacity could reflect effusion plus atelectasis or pneumonia.   Electronically Signed   By: Odessa FlemingH  Hall M.D.   On: 04/27/2014 16:05    ASSESSMENT AND PLAN  1. NSTEMI  - likely can reduce his 325mg  ASA to low dose  - cath 04/29/2014 severe native CAD with occluded LAD and distal RCA with subtotal mid RCA occlusion, severe dx in LCx, occluded SVG to RPL and PDA, L to R collateral to distal RCA, patent LIMA to LAD, patent SVG to 2 OM branches, severe LVEDP 30-1834mmHg, no obvious culprit for positive trop, likely demand ischemia in the setting of hypertensive urgency and acute on chronic HF  - will focus on medical management of HF and BP med. Continue IV lasix today. Called echo lab as he had an order for echocardiogram since 3/22, they will do today.  -continue hydralazine, add Imdur. Continue metoprolol  2. Acute on chronic systolic HF  - s/p IV diuresis. Currently off lasix pending hydration.    - LVEDP 30-3234mmHg, s/p IV lasix 60mg  yesterday, despite severely elevated LVEDP, appears to be euvolemic on exam, can consider lasix 1 more day, expect change to PO lasix tomorrow.  3. CAD s/p 5v CABG 2001 LIMA-LAD, SeqSVG-OM-dCx, SeqSVG-rVM-rPDA  4. CKD stage III/IV 5. DM  Signed, Amedeo PlentyMeng, Hao PA-C Pager: [phone]  I have seen and examined the patient along with Azalee CourseMeng, Hao PA-C.  I have reviewed the chart, notes and new data.  I agree with PA/NP's note.  Key new  complaints: improved, no orthopnea Key examination changes: no clinical overt signs of CHF Key new findings / data: cath shows he is still substantially fluid overloaded; interval occlusion of SVG to inferior wall vessels, but no revascularization options  PLAN: Needs more diuresis. ACEi/ARB are relatively contraindicated due to renal function. Premature to start beta blocker due to hypervolemia. Will try to inch up vasodilators.  Thurmon FairMihai Daena Alper, MD, [facility] CenterFACC [facility] [phone] 04/30/2014, 10:34 AM

## 2014-04-30 NOTE — Progress Notes (Signed)
[facility] PROGRESS NOTE  Justin Bartlett ZOX:[id number] DOB: 02/24/1950 DOA: 04/27/2014 PCP: Thora LanceEHINGER,ROBERT R, MD  Brief Summary  64 -year-old male with known past medical history of ischemic heart disease, anteroseptal myocardial infarction in 2001 status post CABG at that time, history of hypertension, diabetes, dyslipidemia, obesity, chronic kidney disease stage IV who [facility] with worsening left-sided chest pain which started one day prior to the admission.  A few days prior he was seen by his PCP for SOB who performed CXR which demonstrated "fluid" and he was started on lasix which did not improve his SOB.  His breathing worsened and on Sunday night, he developed chest pain.  Chest pain was 8 out of 10 in intensity, substernal, nonradiating, worse with exertion but it was present at rest. Chest pain awoke him from sleep and spontaneously resolved within 30 minutes on its own.  He came to the ER.   On admission, VSS except for initial oxygen saturation 89% on room air which improved to 97% with nasal cannula. Blood work was notable for mild leukocytosis of 12.0, creatinine of 2.29, troponin of 1.48 which peaked at 1.78, and potassium of 6. No acute ischemic changes were identified on 12-lead EKG. Cardiology was consulted for NSTEMI.    Cardiac catheterization demonstrated severe native coronary artery disease with occluded LAD and distal RCA with subtotal occlusion more proximally in the RCA. Also severe disease in the circumflex system.Occluded SVG-RPL-PDA, leaving no flow to the distal RCA system with exception of collaterals from the LAD.  Widely patent LIMA-LAD to a very small caliber distal LAD with retrograde filling to a diagonal branch. Also widely patent sequential vein graft to 2 OM branches with minimal disease in the downstream vessels.  Severe systemic hypertension with severely elevated LVEDP of 30-34 mmHg.    Assessment/Plan  Acute hypoxic respiratory failure  secondary to acute on chronic systolic and diastolic heart failure with right sided pleural effusion -  BNP 300 -  Last 2-D echo on file in 2011 with ejection fraction of 40%:  Currently EF 55-60% with grade 2 DD -  Continue IV lasix BID -  I/O:  -1.7L -  Still volume overloaded on exam -  Hydralazine and imdur added by cardiology - Appreciate cardiology assistance  NSTEMI, likely demand ischemia from heart failure exacerbation - Troponin level 1.48 --> 1.78 --> 0.79. No acute ischemic changes on 12-lead EKG. - heparin drip dc'd - Continue Aggrenox PO BID, metoprolol, and high dose statin - Cardiac cath:  Severe multivessel disease of native arteries and grafts but no culprit lesion -  Tele:  NSR -  Okay to d/c telemetry  Right-sided opacity likely due to pulmonary edema from heart failure -  Chest x-ray shows residual RLL confluent opacity from 04/23/2014 study which could reflect effusion, atelectasis or pneumonia. - No reports of fevers at home. Mild elevation in white blood cell count likely reactive. - Strep pneumonia is negative. Legionella is negative - Blood cultures are NGTD - Antibiotics deferred -  Continue incentive spirometry  Diabetes mellitus with renal manifestations, CBG well controlled here, but uncontrolled at baseline - continue moderate dose SSI - A1c 9.2  Essential hypertension - Continue hydralazine, metoprolol - imdur added by cardiology  Chronic kidney disease stage IV, creatinine near baseline -  Renally dose medications -  Minimize contrast exposure  Dyslipidemia - Continue Crestor 20 mg daily and Zetia 10 mg at bedtime  Diet:  Healthy heart, diabetic Access:  PIV IVF:  off Proph:  Heparin  Code Status: full Family Communication: patient and wife Disposition Plan: pending further diuresis   Consultants:  Cardiology  Procedures:  CXR  Antibiotics:  none   HPI/Subjective:  Feeling less full body heaviness and shortness of  breath  Objective: Filed Vitals:   04/30/14 0511 04/30/14 1017 04/30/14 1400 04/30/14 1541  BP: 141/69 144/68 135/53 150/67  Pulse: 71 82 78   Temp: 98.6 F (37 C)  98.7 F (37.1 C)   TempSrc: Oral  Oral   Resp: 18  18   Height:      Weight:      SpO2: 94%  93%     Intake/Output Summary (Last 24 hours) at 04/30/14 1644 Last data filed at 04/30/14 1543  Gross per 24 hour  Intake 721.35 ml  Output   2975 ml  Net -2253.65 ml   Filed Weights   04/28/14 0608 04/28/14 1309 04/29/14 0500  Weight: 132.4 kg (291 lb 14.2 oz) 132 kg (291 lb 0.1 oz) 131.5 kg (289 lb 14.5 oz)    Exam:   General:  Obese male, No acute distress  HEENT:  NCAT, MMM  Cardiovascular:  RRR, nl S1, S2 no mrg, 2+ pulses, warm extremities  Respiratory:  Rales at bilateral bases, no increased WOB  Abdomen:   NABS, soft, NT/ND  MSK:   Normal tone and bulk, 2+ bilateral pitting LEE  Neuro:  Grossly intact  Data Reviewed: Basic Metabolic Panel:  Recent Labs Lab 04/27/14 1552 04/28/14 0841 04/29/14 0107 04/29/14 0516 04/30/14 0650  NA 140 141 140 141 139  K 4.4 6.0* 5.0 4.4 4.1  CL 104 103 103 102 96  CO2 34*  GLUCOSE 185* 147* 178* 154* 138*  BUN 43* 42* 41* 44* 31*  CREATININE 2.04* 2.26* 2.19* 2.11* 1.85*  CALCIUM 8.4 8.2* 8.1* 8.2* 8.2*   Liver Function Tests:  Recent Labs Lab 04/28/14 0841  AST 22  ALT 22  ALKPHOS 96  BILITOT 0.5  PROT 6.6  ALBUMIN 3.1*   No results for input(s): LIPASE, AMYLASE in the last 168 hours. No results for input(s): AMMONIA in the last 168 hours. CBC:  Recent Labs Lab 04/27/14 1552 04/28/14 0100 04/28/14 0841 04/29/14 0107 04/30/14 0650  WBC 12.0* 12.7* 13.0* 11.2* 10.0  NEUTROABS 9.2*  --  10.2*  --   --   HGB 14.5 13.3 14.3 13.0 12.4*  HCT 47.6 44.6 48.3 43.3 41.1  MCV 93.3 92.7 94.0 91.7 92.6  PLT 210 217 202 198 188   Cardiac Enzymes:  Recent Labs Lab 04/27/14 1552 04/27/14 2047 04/28/14 0100 04/28/14 0722  04/28/14 1706  CKTOTAL  --  121  --   --   --   CKMB  --  8.7*  --   --   --   TROPONINI 0.05* 1.48* 1.78* 0.79* 0.48*   BNP (last 3 results)  Recent Labs  04/27/14 1552  BNP 325.5*    ProBNP (last 3 results)  Recent Labs  04/26/14 1149  PROBNP 241.0*    CBG:  Recent Labs Lab 04/29/14 1733 04/29/14 1845 04/30/14 0026 04/30/14 0514 04/30/14 1227  GLUCAP 163* 157* 205* 135* 177*    Recent Results (from the past 240 hour(s))  Culture, blood (routine x 2)     Status: None (Preliminary result)   Collection Time: 04/27/14  8:38 PM  Result Value Ref Range Status   Specimen Description BLOOD RIGHT HAND  Final   Special Requests  BOTTLES DRAWN AEROBIC AND ANAEROBIC  Final   Culture   Final           BLOOD CULTURE RECEIVED NO GROWTH TO DATE CULTURE WILL BE HELD FOR 5 DAYS BEFORE ISSUING A FINAL NEGATIVE REPORT Performed at [facility]    Report Status PENDING  Incomplete  Culture, blood (routine x 2)     Status: None (Preliminary result)   Collection Time: 04/27/14  8:47 PM  Result Value Ref Range Status   Specimen Description BLOOD LEFT WRIST  Final   Special Requests BOTTLES DRAWN AEROBIC AND ANAEROBIC  Final   Culture   Final           BLOOD CULTURE RECEIVED NO GROWTH TO DATE CULTURE WILL BE HELD FOR 5 DAYS BEFORE ISSUING A FINAL NEGATIVE REPORT Performed at [facility]    Report Status PENDING  Incomplete     Studies: No results found.  Scheduled Meds: . antiseptic oral rinse  7 mL Mouth Rinse q12n4p  . aspirin EC  325 mg Oral Daily  . chlorhexidine  15 mL Mouth Rinse BID  . dipyridamole-aspirin  1 capsule Oral BID  . ezetimibe  10 mg Oral QHS   And  . rosuvastatin  20 mg Oral q1800  . furosemide  60 mg Intravenous BID  . heparin  5,000 Units Subcutaneous 3 times per day  . hydrALAZINE  25 mg Oral TID  . insulin aspart  0-15 Units Subcutaneous 4 times per day  . [START ON 05/01/2014] isosorbide mononitrate  30 mg Oral Daily  .  metoprolol  100 mg Oral BID  . sodium chloride  3 mL Intravenous Q12H   Continuous Infusions:    Principal Problem:   Acute on chronic combined systolic and diastolic CHF (congestive heart failure) Active Problems:   Diabetes mellitus   Hypercholesterolemia   Pleural effusion   CAD (coronary artery disease)   Ischemic cardiomyopathy   Chest pain at rest   CKD (chronic kidney disease)   Carotid stenosis   Elevated troponin   SOB (shortness of breath)    Time spent: 30 min    Jazlyne Gauger, [facility]  [facility] Pager [phone]. If 7PM-7AM, please contact night-coverage at [email or web address], password [email or web address] 04/30/2014, 4:44 PM  LOS: 3 days

## 2014-05-01 LAB — BASIC METABOLIC PANEL
Anion gap: 10 (ref 5–15)
BUN: 32 mg/dL — AB (ref 6–23)
CALCIUM: 8.8 mg/dL (ref 8.4–10.5)
CO2: 38 mmol/L — ABNORMAL HIGH (ref 19–32)
Chloride: 93 mmol/L — ABNORMAL LOW (ref 96–112)
Creatinine, Ser: 1.84 mg/dL — ABNORMAL HIGH (ref 0.50–1.35)
GFR calc Af Amer: 43 mL/min — ABNORMAL LOW (ref >90)
GFR calc non Af Amer: 37 mL/min — ABNORMAL LOW (ref >90)
Glucose, Bld: 216 mg/dL — ABNORMAL HIGH (ref 70–99)
Potassium: 3.8 mmol/L (ref 3.5–5.1)
Sodium: 141 mmol/L (ref 135–145)

## 2014-05-01 LAB — CBC
HEMATOCRIT: 42.6 % (ref 39.0–52.0)
HEMOGLOBIN: 12.8 g/dL — AB (ref 13.0–17.0)
MCH: 27.8 pg (ref 26.0–34.0)
MCHC: 30 g/dL (ref 30.0–36.0)
MCV: 92.6 fL (ref 78.0–100.0)
PLATELETS: 181 10*3/uL (ref 150–400)
RBC: 4.6 MIL/uL (ref 4.22–5.81)
RDW: 14.9 % (ref 11.5–15.5)
WBC: 8.9 10*3/uL (ref 4.0–10.5)

## 2014-05-01 LAB — GLUCOSE, CAPILLARY
GLUCOSE-CAPILLARY: 161 mg/dL — AB (ref 70–99)
GLUCOSE-CAPILLARY: 266 mg/dL — AB (ref 70–99)
Glucose-Capillary: 247 mg/dL — ABNORMAL HIGH (ref 70–99)
Glucose-Capillary: 274 mg/dL — ABNORMAL HIGH (ref 70–99)
Glucose-Capillary: 280 mg/dL — ABNORMAL HIGH (ref 70–99)

## 2014-05-01 MED ORDER — HYDRALAZINE HCL 50 MG PO TABS
50.0000 mg | ORAL_TABLET | Freq: Three times a day (TID) | ORAL | Status: DC
  Administered 2014-05-01 – 2014-05-03 (×6): 50 mg via ORAL
  Filled 2014-05-01 (×9): qty 1

## 2014-05-01 MED ORDER — FUROSEMIDE 80 MG PO TABS
80.0000 mg | ORAL_TABLET | Freq: Two times a day (BID) | ORAL | Status: DC
  Administered 2014-05-01 – 2014-05-02 (×2): 80 mg via ORAL
  Filled 2014-05-01 (×4): qty 1

## 2014-05-01 MED ORDER — ACETAMINOPHEN 325 MG PO TABS
650.0000 mg | ORAL_TABLET | Freq: Four times a day (QID) | ORAL | Status: DC | PRN
  Administered 2014-05-01 – 2014-05-02 (×2): 650 mg via ORAL
  Filled 2014-05-01 (×2): qty 2

## 2014-05-01 MED ORDER — INSULIN ASPART 100 UNIT/ML ~~LOC~~ SOLN
0.0000 [IU] | Freq: Three times a day (TID) | SUBCUTANEOUS | Status: DC
  Administered 2014-05-02 (×2): 8 [IU] via SUBCUTANEOUS
  Administered 2014-05-02: 5 [IU] via SUBCUTANEOUS
  Administered 2014-05-02: 11 [IU] via SUBCUTANEOUS
  Administered 2014-05-03 (×2): 8 [IU] via SUBCUTANEOUS

## 2014-05-01 MED ORDER — ISOSORBIDE MONONITRATE ER 60 MG PO TB24
60.0000 mg | ORAL_TABLET | Freq: Every day | ORAL | Status: DC
  Administered 2014-05-02 – 2014-05-03 (×2): 60 mg via ORAL
  Filled 2014-05-01 (×2): qty 1

## 2014-05-01 NOTE — Progress Notes (Signed)
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  A few days prior he was seen by his PCP for SOB who performed CXR which demonstrated "fluid" and he was started on lasix which did not improve his SOB.  His breathing worsened and on Sunday night, he developed chest pain.  Chest pain was 8 out of 10 in intensity, substernal, nonradiating, worse with exertion but it was present at rest. Chest pain awoke him from sleep and spontaneously resolved within 30 minutes on its own.  He came to the ER.   On admission, VSS except for initial oxygen saturation 89% on room air which improved to 97% with nasal cannula. Blood work was notable for mild leukocytosis of 12.0, creatinine of 2.29, troponin of 1.48 which peaked at 1.78, and potassium of 6. No acute ischemic changes were identified on 12-lead EKG. Cardiology was consulted for NSTEMI.    Cardiac catheterization demonstrated severe native coronary artery disease with occluded LAD and distal RCA with subtotal occlusion more proximally in the RCA. Also severe disease in the circumflex system.Occluded SVG-RPL-PDA, leaving no flow to the distal RCA system with exception of collaterals from the LAD.  Widely patent LIMA-LAD to a very small caliber distal LAD with retrograde filling to a diagonal branch. Also widely patent sequential vein graft to 2 OM branches with minimal disease in the downstream vessels.  Severe systemic hypertension with severely elevated LVEDP of 30-34 mmHg.    Assessment/Plan  Acute hypoxic respiratory failure  secondary to acute on chronic systolic and diastolic heart failure with right sided pleural effusion -  BNP 300 -  Last 2-D echo on file in 2011 with ejection fraction of 40%:  Currently EF 55-60% with grade 2 DD -  Lasix per cardiology -  I/O:  -2.8L -  Hydralazine and imdur added by cardiology - Appreciate cardiology assistance  NSTEMI, likely demand ischemia from heart failure exacerbation - Troponin level 1.48 --> 1.78 --> 0.79. No acute ischemic changes on 12-lead EKG. - Continue Aggrenox PO BID, metoprolol, and high dose statin - Cardiac cath:  Severe multivessel disease of native arteries and grafts but no culprit lesion -  Tele:  NSR, tele d/c'd on 3/25  Right-sided opacity likely due to pulmonary edema from heart failure - Chest x-ray shows residual RLL confluent opacity from 04/23/2014 study which could reflect effusion, atelectasis or pneumonia. - No reports of fevers at home. Mild elevation in white blood cell count likely reactive. - Strep pneumonia is negative. Legionella is negative - Blood cultures are NGTD - Antibiotics deferred - Continue incentive spirometry  Diabetes mellitus with renal manifestations, CBG well controlled here, but uncontrolled at baseline - continue moderate dose SSI - A1c 9.2  Essential hypertension, BP still elevated - Hydralazine and imdur increased today -  continue metoprolol  Chronic kidney disease stage IV, creatinine near baseline -  Renally dose medications -  Minimize contrast exposure  Dyslipidemia - Continue Crestor 20 mg daily and Zetia 10 mg at bedtime  Diet:  Healthy heart, diabetic Access:  PIV IVF:  off Proph:  Heparin  Code Status: full Family Communication: patient  and wife Disposition Plan: pending further diuresis   Consultants:  Cardiology  Procedures:  CXR  Antibiotics:  none   HPI/Subjective:  Feeling less full body heaviness and shortness of breath, still on oxygen  Objective: Filed Vitals:    04/30/14 1541 04/30/14 2106 05/01/14 0524 05/01/14 1047  BP: 150/67 156/54 158/67 143/63  Pulse:  80 78 74  Temp:  98.3 F (36.8 C) 98 F (36.7 C)   TempSrc:  Oral Oral   Resp:  16 18 16   Height:      Weight:   130.994 kg (288 lb 12.6 oz)   SpO2:  95% 98% 97%    Intake/Output Summary (Last 24 hours) at 05/01/14 1235 Last data filed at 05/01/14 1051  Gross per 24 hour  Intake    630 ml  Output   3525 ml  Net  -2895 ml   Filed Weights   04/28/14 1309 04/29/14 0500 05/01/14 0524  Weight: 132 kg (291 lb 0.1 oz) 131.5 kg (289 lb 14.5 oz) 130.994 kg (288 lb 12.6 oz)    Exam:   General:  Obese male, No acute distress  HEENT:  NCAT, MMM  Cardiovascular:  RRR, nl S1, S2 no mrg, 2+ pulses, warm extremities  Respiratory:  Rales at bilateral bases, no increased WOB  Abdomen:   NABS, soft, NT/ND  MSK:   Normal tone and bulk, 2+ bilateral pitting LEE  Neuro:  Grossly intact  Data Reviewed: Basic Metabolic Panel:  Recent Labs Lab 04/28/14 0841 04/29/14 0107 04/29/14 0516 04/30/14 0650 05/01/14 0312  NA 141 140 141 139 141  K 6.0* 5.0 4.4 4.1 3.8  CL 103 103 102 96 93*  CO2 29 29 31  34* 38*  GLUCOSE 147* 178* 154* 138* 216*  BUN 42* 41* 44* 31* 32*  CREATININE 2.26* 2.19* 2.11* 1.85* 1.84*  CALCIUM 8.2* 8.1* 8.2* 8.2* 8.8   Liver Function Tests:  Recent Labs Lab 04/28/14 0841  AST 22  ALT 22  ALKPHOS 96  BILITOT 0.5  PROT 6.6  ALBUMIN 3.1*   No results for input(s): LIPASE, AMYLASE in the last 168 hours. No results for input(s): AMMONIA in the last 168 hours. CBC:  Recent Labs Lab 04/27/14 1552 04/28/14 0100 04/28/14 0841 04/29/14 0107 04/30/14 0650 05/01/14 0312  WBC 12.0* 12.7* 13.0* 11.2* 10.0 8.9  NEUTROABS 9.2*  --  10.2*  --   --   --   HGB 14.5 13.3 14.3 13.0 12.4* 12.8*  HCT 47.6 44.6 48.3 43.3 41.1 42.6  MCV 93.3 92.7 94.0 91.7 92.6 92.6  PLT 210 217 202 198 188 181   Cardiac Enzymes:  Recent Labs Lab 04/27/14 1552  04/27/14 2047 04/28/14 0100 04/28/14 0722 04/28/14 1706  CKTOTAL  --  121  --   --   --   CKMB  --  8.7*  --   --   --   TROPONINI 0.05* 1.48* 1.78* 0.79* 0.48*   BNP (last 3 results)  Recent Labs  04/27/14 1552  BNP 325.5*    ProBNP (last 3 results)  Recent Labs  04/26/14 1149  PROBNP 241.0*    CBG:  Recent Labs Lab 04/30/14 1227 04/30/14 1700 05/01/14 0009 05/01/14 0534 05/01/14 1126  GLUCAP 177* 155* 280* 161* 274*    Recent Results (from the past 240 hour(s))  Culture, blood (routine x 2)     Status: None (Preliminary result)   Collection Time: 04/27/14  8:38 PM  Result Value Ref Range Status  Specimen Description BLOOD RIGHT HAND  Final   Special Requests BOTTLES DRAWN AEROBIC AND ANAEROBIC  Final   Culture   Final           BLOOD CULTURE RECEIVED NO GROWTH TO DATE CULTURE WILL BE HELD FOR 5 DAYS BEFORE ISSUING A FINAL NEGATIVE REPORT Performed at [facility]    Report Status PENDING  Incomplete  Culture, blood (routine x 2)     Status: None (Preliminary result)   Collection Time: 04/27/14  8:47 PM  Result Value Ref Range Status   Specimen Description BLOOD LEFT WRIST  Final   Special Requests BOTTLES DRAWN AEROBIC AND ANAEROBIC  Final   Culture   Final           BLOOD CULTURE RECEIVED NO GROWTH TO DATE CULTURE WILL BE HELD FOR 5 DAYS BEFORE ISSUING A FINAL NEGATIVE REPORT Performed at [facility]    Report Status PENDING  Incomplete     Studies: No results found.  Scheduled Meds: . antiseptic oral rinse  7 mL Mouth Rinse q12n4p  . aspirin EC  325 mg Oral Daily  . chlorhexidine  15 mL Mouth Rinse BID  . dipyridamole-aspirin  1 capsule Oral BID  . ezetimibe  10 mg Oral QHS   And  . rosuvastatin  20 mg Oral q1800  . furosemide  80 mg Oral BID  . heparin  5,000 Units Subcutaneous 3 times per day  . hydrALAZINE  25 mg Oral TID  . insulin aspart  0-15 Units Subcutaneous 4 times per day  . isosorbide mononitrate  30  mg Oral Daily  . metoprolol  100 mg Oral BID  . sodium chloride  3 mL Intravenous Q12H   Continuous Infusions:    Principal Problem:   Acute on chronic combined systolic and diastolic CHF (congestive heart failure) Active Problems:   Diabetes mellitus   Hypercholesterolemia   Pleural effusion   CAD (coronary artery disease)   Ischemic cardiomyopathy   Chest pain at rest   CKD (chronic kidney disease)   Carotid stenosis   Elevated troponin   SOB (shortness of breath)    Time spent: 30 min    Justin Bartlett, [facility]  [facility] Pager [phone]. If 7PM-7AM, please contact night-coverage at [email or web address], password [facility] 05/01/2014, 12:35 PM  LOS: 4 days

## 2014-05-01 NOTE — Progress Notes (Signed)
I cosign all documentation and medication administration by Komicia Jeffries, student RN for this shift.            

## 2014-05-01 NOTE — Progress Notes (Signed)
Patient Name: Justin Bartlett Date of Encounter: 05/01/2014  Principal Problem:   Acute on chronic combined systolic and diastolic CHF (congestive heart failure) Active Problems:   Diabetes mellitus   Hypercholesterolemia   Pleural effusion   CAD (coronary artery disease)   Ischemic cardiomyopathy   Chest pain at rest   CKD (chronic kidney disease)   Carotid stenosis   Elevated troponin   SOB (shortness of breath)   Length of Stay: 4  SUBJECTIVE  Feels much better. Echo confirms normal LVEF as well as increased mean left atrial pressure. Substantial diuresis since then, but still on 1L O2.  CURRENT MEDS . antiseptic oral rinse  7 mL Mouth Rinse q12n4p  . aspirin EC  325 mg Oral Daily  . chlorhexidine  15 mL Mouth Rinse BID  . dipyridamole-aspirin  1 capsule Oral BID  . ezetimibe  10 mg Oral QHS   And  . rosuvastatin  20 mg Oral q1800  . furosemide  80 mg Oral BID  . heparin  5,000 Units Subcutaneous 3 times per day  . hydrALAZINE  25 mg Oral TID  . insulin aspart  0-15 Units Subcutaneous 4 times per day  . isosorbide mononitrate  30 mg Oral Daily  . metoprolol  100 mg Oral BID  . sodium chloride  3 mL Intravenous Q12H    OBJECTIVE   Intake/Output Summary (Last 24 hours) at 05/01/14 1236 Last data filed at 05/01/14 1051  Gross per 24 hour  Intake    630 ml  Output   3525 ml  Net  -2895 ml   Filed Weights   04/28/14 1309 04/29/14 0500 05/01/14 0524  Weight: 291 lb 0.1 oz (132 kg) 289 lb 14.5 oz (131.5 kg) 288 lb 12.6 oz (130.994 kg)    PHYSICAL EXAM Filed Vitals:   04/30/14 1541 04/30/14 2106 05/01/14 0524 05/01/14 1047  BP: 150/67 156/54 158/67 143/63  Pulse:  80 78 74  Temp:  98.3 F (36.8 C) 98 F (36.7 C)   TempSrc:  Oral Oral   Resp:  Height:      Weight:   288 lb 12.6 oz (130.994 kg)   SpO2:  95% 98% 97%   General: Alert, oriented x3, no distress Head: no evidence of trauma, PERRL, EOMI, no exophtalmos or lid lag, no myxedema, no  xanthelasma; normal ears, nose and oropharynx Neck: normal jugular venous pulsations and no hepatojugular reflux; brisk carotid pulses without delay and no carotid bruits Chest: clear to auscultation, no signs of consolidation by percussion or palpation, normal fremitus, symmetrical and full respiratory excursions Cardiovascular: normal position and quality of the apical impulse, regular rhythm, normal first and second heart sounds, no rubs or gallops, no murmur Abdomen: no tenderness or distention, no masses by palpation, no abnormal pulsatility or arterial bruits, normal bowel sounds, no hepatosplenomegaly Extremities: no clubbing, cyanosis or edema; 2+ radial, ulnar and brachial pulses bilaterally; 2+ right femoral, posterior tibial and dorsalis pedis pulses; 2+ left femoral, posterior tibial and dorsalis pedis pulses; no subclavian or femoral bruits Neurological: grossly nonfocal  LABS  CBC  Recent Labs  04/30/14 0650 05/01/14 0312  WBC 10.0 8.9  HGB 12.4* 12.8*  HCT 41.1 42.6  MCV 92.6 92.6  PLT 188 181   Basic Metabolic Panel  Recent Labs  04/30/14 0650 05/01/14 0312  NA 139 141  K 4.1 3.8  CL 96 93*  CO2 34* 38*  GLUCOSE 138* 216*  BUN 31* 32*  CREATININE 1.85* 1.84*  CALCIUM 8.2* 8.8   Liver Function Tests No results for input(s): AST, ALT, ALKPHOS, BILITOT, PROT, ALBUMIN in the last 72 hours. No results for input(s): LIPASE, AMYLASE in the last 72 hours. Cardiac Enzymes  Recent Labs  04/28/14 1706  TROPONINI 0.48*    Radiology Studies Imaging results have been reviewed and No results found.  TELE NSR  ASSESSMENT AND PLAN  Improved diastolic heart failure. Still hypertensive. Switch to PO diuretics. Increase vasodilators. Not on ACEi/ARB due to renal function. Possible DC tomorrow.   Thurmon FairMihai Muranda Coye, MD, [facility]FACC [facility] [phone] office [phone] pager 05/01/2014 12:36 PM

## 2014-05-02 DIAGNOSIS — E0822 Diabetes mellitus due to underlying condition with diabetic chronic kidney disease: Secondary | ICD-10-CM

## 2014-05-02 LAB — GLUCOSE, CAPILLARY
GLUCOSE-CAPILLARY: 328 mg/dL — AB (ref 70–99)
Glucose-Capillary: 247 mg/dL — ABNORMAL HIGH (ref 70–99)
Glucose-Capillary: 277 mg/dL — ABNORMAL HIGH (ref 70–99)
Glucose-Capillary: 275 mg/dL — ABNORMAL HIGH (ref 70–99)

## 2014-05-02 LAB — BASIC METABOLIC PANEL
Anion gap: 8 (ref 5–15)
BUN: 33 mg/dL — ABNORMAL HIGH (ref 6–23)
CHLORIDE: 91 mmol/L — AB (ref 96–112)
CO2: 36 mmol/L — ABNORMAL HIGH (ref 19–32)
Calcium: 8.9 mg/dL (ref 8.4–10.5)
Creatinine, Ser: 1.98 mg/dL — ABNORMAL HIGH (ref 0.50–1.35)
GFR calc Af Amer: 39 mL/min — ABNORMAL LOW (ref >90)
GFR calc non Af Amer: 34 mL/min — ABNORMAL LOW (ref >90)
Glucose, Bld: 265 mg/dL — ABNORMAL HIGH (ref 70–99)
Potassium: 4 mmol/L (ref 3.5–5.1)
SODIUM: 135 mmol/L (ref 135–145)

## 2014-05-02 MED ORDER — HYDRALAZINE HCL 50 MG PO TABS: 75.0000 mg | ORAL_TABLET | Freq: Three times a day (TID) | ORAL | Status: DC

## 2014-05-02 MED ORDER — INSULIN ASPART 100 UNIT/ML ~~LOC~~ SOLN
10.0000 [IU] | Freq: Three times a day (TID) | SUBCUTANEOUS | Status: DC
  Administered 2014-05-02 – 2014-05-03 (×4): 10 [IU] via SUBCUTANEOUS

## 2014-05-02 MED ORDER — FUROSEMIDE 80 MG PO TABS: 80.0000 mg | ORAL_TABLET | Freq: Two times a day (BID) | ORAL | Status: DC

## 2014-05-02 MED ORDER — ISOSORBIDE MONONITRATE ER 60 MG PO TB24: 60.0000 mg | ORAL_TABLET | Freq: Every day | ORAL | Status: DC

## 2014-05-02 MED ORDER — EZETIMIBE-ATORVASTATIN 10-80 MG PO TABS: 30.0000 | ORAL_TABLET | Freq: Every day | ORAL | Status: DC

## 2014-05-02 MED ORDER — INSULIN DETEMIR 100 UNIT/ML ~~LOC~~ SOLN
10.0000 [IU] | Freq: Every day | SUBCUTANEOUS | Status: DC
  Administered 2014-05-03: 10 [IU] via SUBCUTANEOUS
  Filled 2014-05-02 (×3): qty 0.1

## 2014-05-02 MED ORDER — FUROSEMIDE 10 MG/ML IJ SOLN
80.0000 mg | Freq: Four times a day (QID) | INTRAMUSCULAR | Status: AC
  Administered 2014-05-02 (×2): 80 mg via INTRAVENOUS
  Filled 2014-05-02 (×2): qty 8

## 2014-05-02 NOTE — Progress Notes (Signed)
Patient Name: Justin Bartlett Date of Encounter: 05/02/2014  Principal Problem:   Acute on chronic combined systolic and diastolic CHF (congestive heart failure) Active Problems:   Diabetes mellitus   Hypercholesterolemia   Pleural effusion   CAD (coronary artery disease)   Ischemic cardiomyopathy   Chest pain at rest   CKD (chronic kidney disease)   Carotid stenosis   Elevated troponin   SOB (shortness of breath)   Length of Stay: 5  SUBJECTIVE  Feels well. Minimal ankle edema. Off O2 at rest - has yet to try walking without O2. Good net diuresis even after switching to PO diuretics. Slight deterioration in renal function. BP still high  CURRENT MEDS . antiseptic oral rinse  7 mL Mouth Rinse q12n4p  . aspirin EC  325 mg Oral Daily  . chlorhexidine  15 mL Mouth Rinse BID  . dipyridamole-aspirin  1 capsule Oral BID  . ezetimibe  10 mg Oral QHS   And  . rosuvastatin  20 mg Oral q1800  . furosemide  80 mg Oral BID  . heparin  5,000 Units Subcutaneous 3 times per day  . hydrALAZINE  50 mg Oral TID  . insulin aspart  0-15 Units Subcutaneous TID AC & HS  . insulin aspart  10 Units Subcutaneous TID WC  . insulin detemir  10 Units Subcutaneous Daily  . isosorbide mononitrate  60 mg Oral Daily  . metoprolol  100 mg Oral BID  . sodium chloride  3 mL Intravenous Q12H    OBJECTIVE   Intake/Output Summary (Last 24 hours) at 05/02/14 1131 Last data filed at 05/02/14 0841  Gross per 24 hour  Intake    660 ml  Output   2575 ml  Net  -1915 ml   Filed Weights   04/29/14 0500 05/01/14 0524 05/02/14 0504  Weight: 289 lb 14.5 oz (131.5 kg) 288 lb 12.6 oz (130.994 kg) 277 lb 3.2 oz (125.737 kg)    PHYSICAL EXAM Filed Vitals:   05/01/14 1551 05/01/14 2206 05/02/14 0504 05/02/14 1004  BP: 138/60 138/60 142/55 149/59  Pulse: 74 80 70 75  Temp: 98.7 F (37.1 C) 98.7 F (37.1 C) 97.5 F (36.4 C) 98.2 F (36.8 C)  TempSrc: Oral Oral Oral Oral  Resp: 16  18 16   Height:       Weight:   277 lb 3.2 oz (125.737 kg)   SpO2: 98% 95% 95% 91%   General: Alert, oriented x3, no distress Head: no evidence of trauma, PERRL, EOMI, no exophtalmos or lid lag, no myxedema, no xanthelasma; normal ears, nose and oropharynx Neck: normal jugular venous pulsations and no hepatojugular reflux; brisk carotid pulses without delay and no carotid bruits Chest: clear to auscultation, no signs of consolidation by percussion or palpation, normal fremitus, symmetrical and full respiratory excursions Cardiovascular: normal position and quality of the apical impulse, regular rhythm, normal first and second heart sounds, no rubs or gallops, no  murmur Abdomen: no tenderness or distention, no masses by palpation, no abnormal pulsatility or arterial bruits, normal bowel sounds, no hepatosplenomegaly Extremities: no clubbing, cyanosis; minimal bilateral ankle edema; 2+ radial, ulnar and brachial pulses bilaterally; 2+ right femoral, posterior tibial and dorsalis pedis pulses; 2+ left femoral, posterior tibial and dorsalis pedis pulses; no subclavian or femoral bruits Neurological: grossly nonfocal  LABS  CBC  Recent Labs  04/30/14 0650 05/01/14 0312  WBC 10.0 8.9  HGB 12.4* 12.8*  HCT 41.1 42.6  MCV 92.6 92.6  PLT 188 181  Basic Metabolic Panel  Recent Labs  05/01/14 0312 05/02/14 0342  NA 141 135  K 3.8 4.0  CL 93* 91*  CO2 38* 36*  GLUCOSE 216* 265*  BUN 32* 33*  CREATININE 1.84* 1.98*  CALCIUM 8.8 8.9    Radiology Studies Imaging results have been reviewed and No results found.  TELE NSR  ASSESSMENT AND PLAN  Seems to be approaching euvolemic status. Increase hydralazine as BP still high. If he can ambulate without desaturation, probably ready for DC. Would arrange early f/u 1-2 weeks and repeat labs. Discussed daily weight monitoring and sodium restriction in detail. Bring weight log to the office visits.    Thurmon Fair, MD, [facility] [phone] office [phone] pager 05/02/2014 11:31 AM

## 2014-05-02 NOTE — Discharge Summary (Signed)
Physician Discharge Summary  Justin Bartlett EXB:[id number] DOB: January 15, 1951 DOA: 04/27/2014  PCP: Thora Lance, MD  Admit date: 04/27/2014 Discharge date: 05/02/2014  Recommendations for Outpatient Follow-up:  1. Follow-up with cardiology within 1 week of discharge for repeat BMP.   2. Recommend repeat CXR in 1-2 weeks to follow up possible infiltrate after further diuresis.  Patient advised to seek medical attention if he develops symptoms of pneumonia such as fevers, increased shortness of breath, sputum production.   3. Continue to titrate hydralazine and imdur as tolerated 4. Diltiazem discontinued 5. Continue aggrenox 6. Ongoing management of diabetes by PCP  Discharge Diagnoses:  Principal Problem:   Acute on chronic combined systolic and diastolic CHF (congestive heart failure) Active Problems:   Diabetes mellitus   Hypercholesterolemia   Pleural effusion   CAD (coronary artery disease)   Ischemic cardiomyopathy   Chest pain at rest   Chronic kidney disease (CKD), stage IV (severe)   Carotid stenosis   Elevated troponin   SOB (shortness of breath)   Discharge Condition: stable, improved  Diet recommendation: Diabetic, low sodium  Wt Readings from Last 3 Encounters:  05/02/14 125.737 kg (277 lb 3.2 oz)  04/26/14 137.258 kg (302 lb 9.6 oz)  12/18/13 133.267 kg (293 lb 12.8 oz)    History of present illness:   64 -year-old male with known past medical history of ischemic heart disease, 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 A few days prior he was seen by his PCP for SOB who performed CXR which demonstrated "fluid" and he was started on lasix which did not improve his SOB. His breathing worsened and on Sunday night, he developed chest pain.  Chest pain was 8 out of 10 in intensity, substernal, nonradiating, worse with exertion but it was present at rest. Chest pain awoke him from sleep and spontaneously resolved within 30 minutes on its own. He came to the ER.   On admission, VSS except for initial oxygen saturation 89% on room air which improved to 97% with nasal cannula. Blood work was notable for mild leukocytosis of 12.0, creatinine of 2.29, troponin of 1.48 which peaked at 1.78, and potassium of 6. No acute ischemic changes were identified on 12-lead EKG. Cardiology was consulted for NSTEMI.   Cardiac catheterization demonstrated severe native coronary artery disease with occluded LAD and distal RCA with subtotal occlusion more proximally in the RCA. Also severe disease in the circumflex system.Occluded SVG-RPL-PDA, leaving no flow to the distal RCA system with exception of collaterals from the LAD. Widely patent LIMA-LAD to a very small caliber distal LAD with retrograde filling to a diagonal branch. Also widely patent sequential vein graft to 2 OM branches with minimal disease in the downstream vessels. Severe systemic hypertension with severely elevated LVEDP of 30-34 mmHg. He was diuresed with lasix  IV BID and lost about 9L during hospitalization.  Weight on day of discharge was 125.7kg and he still appears somewhat volume overloaded.  Anticipate that he will continue diuresis at home with lasix  PO BID.    Hospital Course:   Acute hypoxic respiratory failure secondary to acute on chronic systolic and diastolic heart failure with right sided pleural effusion - BNP 300 - Last 2-D echo on file in 2011 with ejection fraction of 40%: Currently EF 55-60% with  grade 2 DD - I/O: -1.8L over last 24 hours with lasix 80mg  PO BID - Hydralazine and imdur added by cardiology -  Follow up with cardiology within 1 week of discharge  NSTEMI, likely demand ischemia from heart failure exacerbation - Troponin level 1.48 -->  1.78 --> 0.79. No acute ischemic changes on 12-lead EKG. - Continue Aggrenox PO BID, metoprolol, and high dose statin - Cardiac cath: Severe multivessel disease of native arteries and grafts but no culprit lesion  Right-sided opacity likely due to pulmonary edema from heart failure - Chest x-ray shows residual RLL confluent opacity from 04/23/2014 study which could reflect effusion, atelectasis or pneumonia. - No reports of fevers at home. Mild elevation in white blood cell count likely reactive. - Strep pneumonia was negative. Legionella was negative - Blood cultures are NGTD - Antibiotics deferred - Continue incentive spirometry  Diabetes mellitus with renal manifestations,  but uncontrolled at baseline - Given moderate dose SSI due to severity of illness -  Recommend he resume his previous insulin regimen with close follow up with PCP to continue to work on achieving A1c closer to 7. - A1c 9.2   Essential hypertension, BP still elevated - Hydralazine and imdur increased today - continue metoprolol  Chronic kidney disease stage IV, creatinine near baseline - Renally dose medications - Minimize contrast exposure  Dyslipidemia - Continue Crestor 20 mg daily and Zetia 10 mg at bedtime  Hx of stroke -  Continue aggrenox  Consultants:  Cardiology  Procedures:  CXR  Antibiotics:  none  Discharge Exam: Filed Vitals:   05/02/14 1004  BP: 149/59  Pulse: 75  Temp: 98.2 F (36.8 C)  Resp: 16   Filed Vitals:   05/01/14 1551 05/01/14 2206 05/02/14 0504 05/02/14 1004  BP: 138/60 138/60 142/55 149/59  Pulse: 74 80 70 75  Temp: 98.7 F (37.1 C) 98.7 F (37.1 C) 97.5 F (36.4 C) 98.2 F (36.8 C)  TempSrc: Oral Oral Oral Oral  Resp: 16  18 16   Height:      Weight:   125.737 kg (277 lb 3.2 oz)   SpO2: 98% 95% 95% 91%     General: Obese male, No acute distress  HEENT: NCAT, MMM  Cardiovascular: RRR, nl S1, S2 no mrg, 2+ pulses, warm  extremities  Respiratory: CTAB, no increased WOB  Abdomen: NABS, soft, NT/ND  MSK: Normal tone and bulk, 1+ bilateral pitting LEE  Neuro: Grossly intact  Discharge Instructions      Discharge Instructions    (HEART FAILURE PATIENTS) Call MD:  Anytime you have any of the following symptoms: 1) 3 pound weight gain in 24 hours or 5 pounds in 1 week 2) shortness of breath, with or without a dry hacking cough 3) swelling in the hands, feet or stomach 4) if you have to sleep on extra pillows at night in order to breathe.    Complete by:  As directed      Call MD for:  difficulty breathing, headache or visual disturbances    Complete by:  As directed      Call MD for:  extreme fatigue    Complete by:  As directed      Call MD for:  hives    Complete by:  As directed      Call MD for:  persistant dizziness or light-headedness    Complete by:  As directed      Call MD for:  persistant nausea and vomiting    Complete  by:  As directed      Call MD for:  severe uncontrolled pain    Complete by:  As directed      Call MD for:  temperature >100.4    Complete by:  As directed      Diet - low sodium heart healthy    Complete by:  As directed      Diet Carb Modified    Complete by:  As directed      Discharge instructions    Complete by:  As directed   You were hospitalized with heart failure and a mild heart attack from heart strain.  Please weigh yourself every day and if you gain more than 3-lbs in one day or 5-lbs since your weight today, please call the cardiology office right away.  Continue to eat a low salt diet.  Increase your lasix to  twice a day to continue to lose fluid.  You should be on a high dose statin because of your severe heart disease, so I have given you a new prescription for cholesterol medication.  Stop your diltiazem and previous hydralazine and start imdur and higher dose hydralazine instead.  If you have chest pain and shortness of breath, please seek  immediate medical attention.     Increase activity slowly    Complete by:  As directed             Medication List    STOP taking these medications        diltiazem 240 MG 24 hr capsule  Commonly known as:  CARDIZEM CD     ezetimibe-simvastatin 10-80 MG per tablet  Commonly known as:  VYTORIN      TAKE these medications        ACCU-CHEK AVIVA PLUS test strip  Generic drug:  glucose blood  as directed.     cholecalciferol 1000 UNITS tablet  Commonly known as:  VITAMIN D  Take 2,000 Units by mouth daily.     dipyridamole-aspirin 200-25 MG per 12 hr capsule  Commonly known as:  AGGRENOX  Take 1 capsule by mouth 2 (two) times daily.     Ezetimibe-Atorvastatin 10-80 MG Tabs  Take 30 tablets by mouth daily.     furosemide 80 MG tablet  Commonly known as:  LASIX  Take 1 tablet (80 mg total) by mouth 2 (two) times daily.     HUMULIN R 500 UNIT/ML Soln injection  Generic drug:  insulin regular human CONCENTRATED  Inject 0.16 Units into the skin 3 (three) times daily.     hydrALAZINE 50 MG tablet  Commonly known as:  APRESOLINE  Take 1.5 tablets (75 mg total) by mouth 3 (three) times daily.     INSULIN SYRINGE .3CC/29GX1/2" 29G X 1/2" 0.3 ML Misc  Inject as directed as directed.     isosorbide mononitrate 60 MG 24 hr tablet  Commonly known as:  IMDUR  Take 1 tablet (60 mg total) by mouth daily.     metoprolol 100 MG tablet  Commonly known as:  LOPRESSOR  Take 100 mg by mouth 2 (two) times daily.     PREVIDENT 5000 BOOSTER PLUS 1.1 % Pste  Generic drug:  Sodium Fluoride  Take 1 application by mouth daily.     sitaGLIPtin 50 MG tablet  Commonly known as:  JANUVIA  Take 50 mg by mouth daily.       Follow-up Information    Follow up with Thora Lance, MD.   Specialty:  Family Medicine   Why:  As needed   Contact information:   [address] [place] [address] [phone]       Follow up with Cassell Clementhomas Brackbill, MD. Call in 1 week.    Specialty:  Cardiology   Contact information:   [address] [place] [address] [phone]        The results of significant diagnostics from this hospitalization (including imaging, microbiology, ancillary and laboratory) are listed below for reference.    Significant Diagnostic Studies: Dg Chest 2 View  04/23/2014   CLINICAL DATA:  Two days of dyspnea on exertion without chest pain; history of previous CABG  EXAM: CHEST  2 VIEW  COMPARISON:  PA and lateral chest x-ray of January 16, 2012  FINDINGS: The right hemidiaphragm remains higher than the left. There is a new small right pleural effusion. The pulmonary interstitial markings are increased bilaterally. The pulmonary vascularity is engorged and indistinct. The cardiac silhouette is normal in size. The patient has undergone previous CABG.  IMPRESSION: CHF with small right pleural effusion and bilateral interstitial edema.   Electronically Signed   By: David  [place]az[place]   On: 04/23/2014 12:20   Dg Chest Port 1 View  04/27/2014   CLINICAL DATA:  64 year old male with shortness of breath with exertion. Initial encounter.  EXAM: PORTABLE CHEST - 1 VIEW  COMPARISON:  04/23/2014 and earlier.  FINDINGS: Two frontal views of the chest, 1 semi upright AP view. Continued confluent opacity at the right lung base, mildly improved since 04/23/2014. No associated air bronchograms. Stable cardiomegaly and mediastinal contours. No pneumothorax, pulmonary edema, left pleural effusion, or new pulmonary opacity identified.  IMPRESSION: Mildly improved ventilation at the right lung base since 04/23/2014. The residual confluent opacity could reflect effusion plus atelectasis or pneumonia.   Electronically Signed   By: Odessa FlemingH  Hall M.D.   On: 04/27/2014 16:05    Microbiology: Recent Results (from the past 240 hour(s))  Culture, blood (routine x 2)     Status: None (Preliminary result)   Collection Time: 04/27/14  8:38 PM  Result Value Ref Range  Status   Specimen Description BLOOD RIGHT HAND  Final   Special Requests BOTTLES DRAWN AEROBIC AND ANAEROBIC 5ML  Final   Culture   Final           BLOOD CULTURE RECEIVED NO GROWTH TO DATE CULTURE WILL BE HELD FOR 5 DAYS BEFORE ISSUING A FINAL NEGATIVE REPORT Performed at [facility]    Report Status PENDING  Incomplete  Culture, blood (routine x 2)     Status: None (Preliminary result)   Collection Time: 04/27/14  8:47 PM  Result Value Ref Range Status   Specimen Description BLOOD LEFT WRIST  Final   Special Requests BOTTLES DRAWN AEROBIC AND ANAEROBIC 5ML  Final   Culture   Final           BLOOD CULTURE RECEIVED NO GROWTH TO DATE CULTURE WILL BE HELD FOR 5 DAYS BEFORE ISSUING A FINAL NEGATIVE REPORT Performed at [facility]    Report Status PENDING  Incomplete     Labs: Basic Metabolic Panel:  Recent Labs Lab 04/29/14 0107 04/29/14 0516 04/30/14 0650 05/01/14 0312 05/02/14 0342  NA 140 141 139 141 135  K 5.0 4.4 4.1 3.8 4.0  CL 103 102 96 93* 91*  CO2 29 31 34* 38* 36*  GLUCOSE 178* 154* 138* 216* 265*  BUN 41* 44* 31*  32* 33*  CREATININE 2.19* 2.11* 1.85* 1.84* 1.98*  CALCIUM 8.1* 8.2* 8.2* 8.8 8.9   Liver Function Tests:  Recent Labs Lab 04/28/14 0841  AST 22  ALT 22  ALKPHOS 96  BILITOT 0.5  PROT 6.6  ALBUMIN 3.1*   No results for input(s): LIPASE, AMYLASE in the last 168 hours. No results for input(s): AMMONIA in the last 168 hours. CBC:  Recent Labs Lab 04/27/14 1552 04/28/14 0100 04/28/14 0841 04/29/14 0107 04/30/14 0650 05/01/14 0312  WBC 12.0* 12.7* 13.0* 11.2* 10.0 8.9  NEUTROABS 9.2*  --  10.2*  --   --   --   HGB 14.5 13.3 14.3 13.0 12.4* 12.8*  HCT 47.6 44.6 48.3 43.3 41.1 42.6  MCV 93.3 92.7 94.0 91.7 92.6 92.6  PLT 210 217 202 198 188 181   Cardiac Enzymes:  Recent Labs Lab 04/27/14 1552 04/27/14 2047 04/28/14 0100 04/28/14 0722 04/28/14 1706  CKTOTAL  --  121  --   --   --   CKMB  --  8.7*  --   --   --    TROPONINI 0.05* 1.48* 1.78* 0.79* 0.48*   BNP: BNP (last 3 results)  Recent Labs  04/27/14 1552  BNP 325.5*    ProBNP (last 3 results)  Recent Labs  04/26/14 1149  PROBNP 241.0*    CBG:  Recent Labs Lab 05/01/14 0534 05/01/14 1126 05/01/14 1638 05/01/14 2135 05/02/14 0501  GLUCAP 161* 274* 266* 247* 247*    Time coordinating discharge: 35 minutes  Signed:  Ewelina Naves  [facility] 05/02/2014, 12:06 PM

## 2014-05-02 NOTE — Progress Notes (Signed)
Patient still with desat to mid-80s on RA with ambulation.  Will diurese with IV lasix overnight and ambulate again in AM.  Will still plan to d/c on lasix 80mg  PO BID likely tomorrow.

## 2014-05-03 ENCOUNTER — Telehealth: Payer: Self-pay | Admitting: Cardiology

## 2014-05-03 ENCOUNTER — Other Ambulatory Visit: Payer: Self-pay | Admitting: Physician Assistant

## 2014-05-03 DIAGNOSIS — I5021 Acute systolic (congestive) heart failure: Secondary | ICD-10-CM

## 2014-05-03 LAB — GLUCOSE, CAPILLARY
Glucose-Capillary: 278 mg/dL — ABNORMAL HIGH (ref 70–99)
Glucose-Capillary: 334 mg/dL — ABNORMAL HIGH (ref 70–99)

## 2014-05-03 LAB — BASIC METABOLIC PANEL
Anion gap: 10 (ref 5–15)
BUN: 41 mg/dL — ABNORMAL HIGH (ref 6–23)
CALCIUM: 9.6 mg/dL (ref 8.4–10.5)
CO2: 38 mmol/L — AB (ref 19–32)
CREATININE: 2.25 mg/dL — AB (ref 0.50–1.35)
Chloride: 90 mmol/L — ABNORMAL LOW (ref 96–112)
GFR calc non Af Amer: 29 mL/min — ABNORMAL LOW (ref >90)
GFR, EST AFRICAN AMERICAN: 34 mL/min — AB (ref >90)
Glucose, Bld: 264 mg/dL — ABNORMAL HIGH (ref 70–99)
Potassium: 4.6 mmol/L (ref 3.5–5.1)
Sodium: 138 mmol/L (ref 135–145)

## 2014-05-03 MED ORDER — HYDRALAZINE HCL 50 MG PO TABS
100.0000 mg | ORAL_TABLET | Freq: Three times a day (TID) | ORAL | Status: DC
Start: 2014-05-03 — End: 2014-05-03
  Filled 2014-05-03 (×2): qty 2

## 2014-05-03 MED ORDER — HYDRALAZINE HCL 100 MG PO TABS: 100.0000 mg | ORAL_TABLET | Freq: Three times a day (TID) | ORAL | Status: DC

## 2014-05-03 MED ORDER — ISOSORBIDE MONONITRATE ER 120 MG PO TB24: 120.0000 mg | ORAL_TABLET | Freq: Every day | ORAL | Status: DC

## 2014-05-03 MED ORDER — FUROSEMIDE 80 MG PO TABS
80.0000 mg | ORAL_TABLET | Freq: Two times a day (BID) | ORAL | Status: DC
  Filled 2014-05-03: qty 1

## 2014-05-03 MED ORDER — ISOSORBIDE MONONITRATE ER 60 MG PO TB24: 120.0000 mg | ORAL_TABLET | Freq: Every day | ORAL | Status: DC

## 2014-05-03 NOTE — Telephone Encounter (Signed)
LMTCB and ask for triage.

## 2014-05-03 NOTE — Telephone Encounter (Signed)
7 day TOC pt per Hao--appt 05-10-14 with Lawson FiscalLori at 930am/bmet to follow.

## 2014-05-03 NOTE — Progress Notes (Signed)
Patient pending discharge, 1 week TCM followup and 1 week BMET arranged.   Ramond DialSigned, Arfa Lamarca PA Pager: [phone]

## 2014-05-03 NOTE — Progress Notes (Addendum)
Patient continues to feel well.  Denies DOE.  VSS on 2L nasal canula and has some mild hypertension.  Exam, unchanged from yesterday.  Ambulatory pulse ox demonstrated decreased O2 sat with exertion to mid-80s.  Will set patient up with home oxygen.  Hydralazine increased to 100mg  TID.

## 2014-05-03 NOTE — Progress Notes (Signed)
Discharge instructions given, all questions answered, encouraged to call with any questions or concerns, discharged by wheelchair by NT with Oxygen at 2 L, tolerating well

## 2014-05-03 NOTE — Discharge Summary (Signed)
Physician Discharge Summary  Justin Bartlett:[id number] DOB: August 08, 1950 DOA: 04/27/2014  PCP: Thora Lance, MD  Admit date: 04/27/2014 Discharge date: 05/03/2014  Recommendations for Outpatient Follow-up:  1. Follow-up with cardiology within 1 week of discharge for repeat BMP.   2. Recommend repeat CXR in 1-2 weeks to follow up possible infiltrate after further diuresis.  Patient advised to seek medical attention if he develops symptoms of pneumonia such as fevers, increased shortness of breath, sputum production.   3. Continue to titrate hydralazine and imdur as tolerated 4. Diltiazem discontinued 5. Continue aggrenox 6. Ongoing management of diabetes by PCP.  Referral for sleep study 7. Home oxygen 2L continuous  Discharge Diagnoses:  Principal Problem:   Acute on chronic combined systolic and diastolic CHF (congestive heart failure) Active Problems:   Diabetes mellitus   Hypercholesterolemia   Pleural effusion   CAD (coronary artery disease)   Ischemic cardiomyopathy   Chest pain at rest   Chronic kidney disease (CKD), stage IV (severe)   Carotid stenosis   Elevated troponin   SOB (shortness of breath)   Discharge Condition: stable, improved  Diet recommendation: Diabetic, low sodium  Wt Readings from Last 3 Encounters:  05/03/14 124.331 kg (274 lb 1.6 oz)  04/26/14 137.258 kg (302 lb 9.6 oz)  12/18/13 133.267 kg (293 lb 12.8 oz)    History of present illness:   64 -year-old male with known past medical history of ischemic heart disease, 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 A few days prior he was seen by his PCP for SOB who performed CXR which demonstrated "fluid" and he was started on lasix which did not improve his SOB. His breathing  worsened and on Sunday night, he developed chest pain. Chest pain was 8 out of 10 in intensity, substernal, nonradiating, worse with exertion but it was present at rest. Chest pain awoke him from sleep and spontaneously resolved within 30 minutes on its own. He came to the ER.   On admission, VSS except for initial oxygen saturation 89% on room air which improved to 97% with nasal cannula. Blood work was notable for mild leukocytosis of 12.0, creatinine of 2.29, troponin of 1.48 which peaked at 1.78, and potassium of 6. No acute ischemic changes were identified on 12-lead EKG. Cardiology was consulted for NSTEMI.   Cardiac catheterization demonstrated severe native coronary artery disease with occluded LAD and distal RCA with subtotal occlusion more proximally in the RCA. Also severe disease in the circumflex system.Occluded SVG-RPL-PDA, leaving no flow to the distal RCA system with exception of collaterals from the LAD. Widely patent LIMA-LAD to a very small caliber distal LAD with retrograde filling to a diagonal branch. Also widely patent sequential vein graft to 2 OM branches with minimal disease in the downstream vessels. Severe systemic hypertension with severely elevated LVEDP of 30-34 mmHg. He was diuresed with lasix  IV BID and lost about 9L during hospitalization.  Weight on day of discharge was 124 kg and he still appears somewhat volume overloaded.  Anticipate that he will continue diuresis at home with lasix  PO BID.  He will need home oxygen.    Hospital Course:   Acute hypoxic respiratory failure secondary to acute on chronic systolic and diastolic heart failure with right sided pleural effusion - BNP 300 -  Last 2-D echo on file in 2011 with ejection fraction of 40%: Currently EF 55-60% with grade 2 DD - I/O: -1.8L over last 24 hours with lasix 80mg  PO BID - Hydralazine and imdur added by cardiology -  Follow up with cardiology within 1 week of discharge  NSTEMI,  likely demand ischemia from heart failure exacerbation - Troponin level 1.48 --> 1.78 --> 0.79. No acute ischemic changes on 12-lead EKG. - Continued Aggrenox PO BID, metoprolol, and high dose statin - Cardiac cath: Severe multivessel disease of native arteries and grafts but no culprit lesion  Right-sided opacity likely due to pulmonary edema from heart failure - Chest x-ray shows residual RLL confluent opacity from 04/23/2014 study which could reflect effusion, atelectasis or pneumonia. - No reports of fevers at home. Mild elevation in white blood cell count likely reactive. - Strep pneumonia was negative. Legionella was negative - Blood cultures are NGTD - Antibiotics deferred - Continue incentive spirometry  Diabetes mellitus with renal manifestations,  but uncontrolled at baseline - Given moderate dose SSI due to severity of illness -  Recommend he resume his previous insulin regimen with close follow up with PCP to continue to work on achieving A1c closer to 7. - A1c 9.2   Essential hypertension, BP still elevated - Hydralazine and imdur increased again on 3/28 - continued metoprolol  Chronic kidney disease stage IV, creatinine near baseline - Renally dose medications - Minimize contrast exposure  Dyslipidemia - Continue Crestor 20 mg daily and Zetia 10 mg at bedtime  Hx of stroke -  Continue aggrenox  Consultants:  Cardiology  Procedures:  CXR  Antibiotics:  none  Discharge Exam: Filed Vitals:   05/03/14 1128  BP: 146/71  Pulse: 79  Temp:   Resp:    Filed Vitals:   05/02/14 1429 05/02/14 2032 05/03/14 0435 05/03/14 1128  BP: 146/61 123/68 150/59 146/71  Pulse: 74 72 75 79  Temp: 98.9 F (37.2 C) 98.5 F (36.9 C) 97.9 F (36.6 C)   TempSrc: Oral Oral Oral   Resp: 16 18 18    Height:      Weight:   124.331 kg (274 lb 1.6 oz)   SpO2: 90% 92% 92%      General: Obese male, No acute distress, anxious to go home  HEENT: NCAT,  MMM  Cardiovascular: RRR, nl S1, S2 no mrg, 2+ pulses, warm extremities  Respiratory: CTAB, no increased WOB  Abdomen: NABS, soft, NT/ND  MSK: Normal tone and bulk, 1+ bilateral pitting LEE  Neuro: Grossly intact  Discharge Instructions      Discharge Instructions    (HEART FAILURE PATIENTS) Call MD:  Anytime you have any of the following symptoms: 1) 3 pound weight gain in 24 hours or 5 pounds in 1 week 2) shortness of breath, with or without a dry hacking cough 3) swelling in the hands, feet or stomach 4) if you have to sleep on extra pillows at night in order to breathe.    Complete by:  As directed      Call MD for:  difficulty breathing, headache or visual disturbances    Complete by:  As directed      Call MD for:  extreme fatigue    Complete by:  As directed      Call MD for:  hives    Complete by:  As directed      Call MD for:  persistant dizziness or light-headedness    Complete by:  As directed  Call MD for:  persistant nausea and vomiting    Complete by:  As directed      Call MD for:  severe uncontrolled pain    Complete by:  As directed      Call MD for:  temperature >100.4    Complete by:  As directed      Diet - low sodium heart healthy    Complete by:  As directed      Diet Carb Modified    Complete by:  As directed      Discharge instructions    Complete by:  As directed   You were hospitalized with heart failure and a mild heart attack from heart strain.  Please weigh yourself every day and if you gain more than 3-lbs in one day or 5-lbs since your weight today, please call the cardiology office right away.  Continue to eat a low salt diet.  Increase your lasix to  twice a day to continue to lose fluid.  You should be on a high dose statin because of your severe heart disease, so I have given you a new prescription for cholesterol medication.  Stop your diltiazem and previous hydralazine and start imdur and higher dose hydralazine instead.  If  you have chest pain and shortness of breath, please seek immediate medical attention.     Increase activity slowly    Complete by:  As directed             Medication List    STOP taking these medications        diltiazem 240 MG 24 hr capsule  Commonly known as:  CARDIZEM CD     ezetimibe-simvastatin 10-80 MG per tablet  Commonly known as:  VYTORIN      TAKE these medications        ACCU-CHEK AVIVA PLUS test strip  Generic drug:  glucose blood  as directed.     cholecalciferol 1000 UNITS tablet  Commonly known as:  VITAMIN D  Take 2,000 Units by mouth daily.     dipyridamole-aspirin 200-25 MG per 12 hr capsule  Commonly known as:  AGGRENOX  Take 1 capsule by mouth 2 (two) times daily.     Ezetimibe-Atorvastatin 10-80 MG Tabs  Take 30 tablets by mouth daily.     furosemide 80 MG tablet  Commonly known as:  LASIX  Take 1 tablet (80 mg total) by mouth 2 (two) times daily.     HUMULIN R 500 UNIT/ML Soln injection  Generic drug:  insulin regular human CONCENTRATED  Inject 0.16 Units into the skin 3 (three) times daily.     hydrALAZINE 100 MG tablet  Commonly known as:  APRESOLINE  Take 1 tablet (100 mg total) by mouth 3 (three) times daily.     INSULIN SYRINGE .3CC/29GX1/2" 29G X 1/2" 0.3 ML Misc  Inject as directed as directed.     isosorbide mononitrate 120 MG 24 hr tablet  Commonly known as:  IMDUR  Take 1 tablet (120 mg total) by mouth daily.  Start taking on:  05/04/2014     metoprolol 100 MG tablet  Commonly known as:  LOPRESSOR  Take 100 mg by mouth 2 (two) times daily.     PREVIDENT 5000 BOOSTER PLUS 1.1 % Pste  Generic drug:  Sodium Fluoride  Take 1 application by mouth daily.     sitaGLIPtin 50 MG tablet  Commonly known as:  JANUVIA  Take 50 mg by mouth daily.  Follow-up Information    Follow up with Thora Lance, MD.   Specialty:  Family Medicine   Why:  As needed   Contact information:   [address]  [place] [address] [phone]       Follow up with Norma Fredrickson, NP On 05/10/2014.   Specialty:  Nurse Practitioner   Why:  9:30am. 7 day Transition of care followup.   Contact information:   [address] [place] [address] [phone]       Follow up with [facility] On 05/10/2014.   Specialty:  Cardiology   Why:  Please obtain 1 week BMet lab at [address] office to monitor renal function during normal lab hr from 8:30am to 4:30pm   Contact information:   [address], [address] [place] [address] [phone]       The results of significant diagnostics from this hospitalization (including imaging, microbiology, ancillary and laboratory) are listed below for reference.    Significant Diagnostic Studies: Dg Chest 2 View  04/23/2014   CLINICAL DATA:  Two days of dyspnea on exertion without chest pain; history of previous CABG  EXAM: CHEST  2 VIEW  COMPARISON:  PA and lateral chest x-ray of January 16, 2012  FINDINGS: The right hemidiaphragm remains higher than the left. There is a new small right pleural effusion. The pulmonary interstitial markings are increased bilaterally. The pulmonary vascularity is engorged and indistinct. The cardiac silhouette is normal in size. The patient has undergone previous CABG.  IMPRESSION: CHF with small right pleural effusion and bilateral interstitial edema.   Electronically Signed   By: David  [place]   On: 04/23/2014 12:20   Dg Chest Port 1 View  04/27/2014   CLINICAL DATA:  64 year old male with shortness of breath with exertion. Initial encounter.  EXAM: PORTABLE CHEST - 1 VIEW  COMPARISON:  04/23/2014 and earlier.  FINDINGS: Two frontal views of the chest, 1 semi upright AP view. Continued confluent opacity at the right lung base, mildly improved since 04/23/2014. No associated air bronchograms. Stable cardiomegaly and mediastinal contours. No pneumothorax, pulmonary edema, left  pleural effusion, or new pulmonary opacity identified.  IMPRESSION: Mildly improved ventilation at the right lung base since 04/23/2014. The residual confluent opacity could reflect effusion plus atelectasis or pneumonia.   Electronically Signed   By: Odessa Fleming M.D.   On: 04/27/2014 16:05    Microbiology: Recent Results (from the past 240 hour(s))  Culture, blood (routine x 2)     Status: None (Preliminary result)   Collection Time: 04/27/14  8:38 PM  Result Value Ref Range Status   Specimen Description BLOOD RIGHT HAND  Final   Special Requests BOTTLES DRAWN AEROBIC AND ANAEROBIC  Final   Culture   Final           BLOOD CULTURE RECEIVED NO GROWTH TO DATE CULTURE WILL BE HELD FOR 5 DAYS BEFORE ISSUING A FINAL NEGATIVE REPORT Performed at [facility]    Report Status PENDING  Incomplete  Culture, blood (routine x 2)     Status: None (Preliminary result)   Collection Time: 04/27/14  8:47 PM  Result Value Ref Range Status   Specimen Description BLOOD LEFT WRIST  Final   Special Requests BOTTLES DRAWN AEROBIC AND ANAEROBIC  Final   Culture   Final           BLOOD CULTURE RECEIVED NO GROWTH TO DATE  CULTURE WILL BE HELD FOR 5 DAYS BEFORE ISSUING A FINAL NEGATIVE REPORT Performed at [facility]    Report Status PENDING  Incomplete     Labs: Basic Metabolic Panel:  Recent Labs Lab 04/29/14 0516 04/30/14 0650 05/01/14 0312 05/02/14 0342 05/03/14 0443  NA 141 139 141 135 138  K 4.4 4.1 3.8 4.0 4.6  CL 102 96 93* 91* 90*  CO2 31 34* 38* 36* 38*  GLUCOSE 154* 138* 216* 265* 264*  BUN 44* 31* 32* 33* 41*  CREATININE 2.11* 1.85* 1.84* 1.98* 2.25*  CALCIUM 8.2* 8.2* 8.8 8.9 9.6   Liver Function Tests:  Recent Labs Lab 04/28/14 0841  AST 22  ALT 22  ALKPHOS 96  BILITOT 0.5  PROT 6.6  ALBUMIN 3.1*   No results for input(s): LIPASE, AMYLASE in the last 168 hours. No results for input(s): AMMONIA in the last 168 hours. CBC:  Recent Labs Lab  04/27/14 1552 04/28/14 0100 04/28/14 0841 04/29/14 0107 04/30/14 0650 05/01/14 0312  WBC 12.0* 12.7* 13.0* 11.2* 10.0 8.9  NEUTROABS 9.2*  --  10.2*  --   --   --   HGB 14.5 13.3 14.3 13.0 12.4* 12.8*  HCT 47.6 44.6 48.3 43.3 41.1 42.6  MCV 93.3 92.7 94.0 91.7 92.6 92.6  PLT 210 217 202 198 188 181   Cardiac Enzymes:  Recent Labs Lab 04/27/14 1552 04/27/14 2047 04/28/14 0100 04/28/14 0722 04/28/14 1706  CKTOTAL  --  121  --   --   --   CKMB  --  8.7*  --   --   --   TROPONINI 0.05* 1.48* 1.78* 0.79* 0.48*   BNP: BNP (last 3 results)  Recent Labs  04/27/14 1552  BNP 325.5*    ProBNP (last 3 results)  Recent Labs  04/26/14 1149  PROBNP 241.0*    CBG:  Recent Labs Lab 05/02/14 1158 05/02/14 1656 05/02/14 2039 05/03/14 0604 05/03/14 1100  GLUCAP 277* 275* 328* 278* 334*    Time coordinating discharge: 35 minutes  Signed:  Corry Storie  [facility] 05/03/2014, 12:44 PM

## 2014-05-03 NOTE — Progress Notes (Signed)
SATURATION QUALIFICATIONS: (This note is used to comply with regulatory documentation for home oxygen)  Patient Saturations on Room Air at Rest = 92%  Patient Saturations on Room Air while Ambulating =84%  Patient Saturations on 1.5 Liters of oxygen while Ambulating = 95%  Please briefly explain why patient needs home oxygen:

## 2014-05-04 LAB — CULTURE, BLOOD (ROUTINE X 2)
Culture: NO GROWTH
Culture: NO GROWTH

## 2014-05-04 NOTE — Telephone Encounter (Signed)
[facility]   Patient understands to follow up with provider ? Norma FredricksonLori Gerhardt on 4/4 @ 9:30am Patient understands discharge instructions? Yes Patient understands medications and regiment? Yes  Patient understands to bring all medications to this visit? Yes  Spoke with pts wife, okay per pt to talk with her. According to d/c papers pt is to stop taking ezetimibe-simvastatin 10-80 and start ezetimibe-atorvastatin 10-80.  Contacted pt pharmacy they do not have they do not have the medication trying to find out where to order it.  Pt is to continue taking the ezetimibe-simvastatin until follow up appt with Lawson FiscalLori 4/4

## 2014-05-05 NOTE — Telephone Encounter (Signed)
Patient d/c on 05/03/14

## 2014-05-06 ENCOUNTER — Encounter (HOSPITAL_COMMUNITY): Payer: BC Managed Care – PPO

## 2014-05-06 ENCOUNTER — Other Ambulatory Visit: Payer: BC Managed Care – PPO

## 2014-05-10 ENCOUNTER — Encounter: Payer: Self-pay | Admitting: Nurse Practitioner

## 2014-05-10 ENCOUNTER — Ambulatory Visit (INDEPENDENT_AMBULATORY_CARE_PROVIDER_SITE_OTHER): Payer: BC Managed Care – PPO | Admitting: Nurse Practitioner

## 2014-05-10 ENCOUNTER — Other Ambulatory Visit (INDEPENDENT_AMBULATORY_CARE_PROVIDER_SITE_OTHER): Payer: BC Managed Care – PPO | Admitting: *Deleted

## 2014-05-10 ENCOUNTER — Ambulatory Visit
Admission: RE | Admit: 2014-05-10 | Discharge: 2014-05-10 | Disposition: A | Discharge status: Home / Self Care | Payer: BC Managed Care – PPO | Source: Ambulatory Visit | Attending: Nurse Practitioner | Admitting: Nurse Practitioner

## 2014-05-10 ENCOUNTER — Telehealth: Payer: Self-pay

## 2014-05-10 VITALS — BP 98/56 | HR 70 | Resp 20 | Ht 71.0 in | Wt 273.0 lb

## 2014-05-10 DIAGNOSIS — I5021 Acute systolic (congestive) heart failure: Secondary | ICD-10-CM | POA: Diagnosis not present

## 2014-05-10 DIAGNOSIS — I5032 Chronic diastolic (congestive) heart failure: Secondary | ICD-10-CM

## 2014-05-10 DIAGNOSIS — R0600 Dyspnea, unspecified: Secondary | ICD-10-CM

## 2014-05-10 DIAGNOSIS — I259 Chronic ischemic heart disease, unspecified: Secondary | ICD-10-CM

## 2014-05-10 DIAGNOSIS — Z9889 Other specified postprocedural states: Secondary | ICD-10-CM

## 2014-05-10 DIAGNOSIS — R899 Unspecified abnormal finding in specimens from other organs, systems and tissues: Secondary | ICD-10-CM

## 2014-05-10 LAB — BASIC METABOLIC PANEL
BUN: 69 mg/dL — AB (ref 6–23)
CHLORIDE: 95 meq/L — AB (ref 96–112)
CO2: 31 meq/L (ref 19–32)
Calcium: 9.9 mg/dL (ref 8.4–10.5)
Creatinine, Ser: 3.71 mg/dL — ABNORMAL HIGH (ref 0.40–1.50)
GFR: 17.61 mL/min — ABNORMAL LOW (ref >60.00)
GLUCOSE: 198 mg/dL — AB (ref 70–99)
POTASSIUM: 4.1 meq/L (ref 3.5–5.1)
Sodium: 136 mEq/L (ref 135–145)

## 2014-05-10 MED ORDER — FUROSEMIDE 80 MG PO TABS: 80.0000 mg | ORAL_TABLET | Freq: Every day | ORAL | Status: DC

## 2014-05-10 MED ORDER — ATORVASTATIN CALCIUM 40 MG PO TABS: 40.0000 mg | ORAL_TABLET | Freq: Every day | ORAL | Status: DC

## 2014-05-10 MED ORDER — ISOSORBIDE MONONITRATE ER 120 MG PO TB24: 120.0000 mg | ORAL_TABLET | Freq: Every day | ORAL | Status: DC

## 2014-05-10 NOTE — Patient Instructions (Addendum)
We will be checking the following labs today BMET, BNP & CBC  Stay on your current medicines but    I am cutting the Lasix back to just 80 mg once a day   I have stopped your Vytorin   I am putting you on Atorvastatin 40 mg daily for your cholesterol  [facility] on the first [facility] for a chest Xray - you may walk in.   Keep restricting your salt  Keep weighing daily  Ok to start using your oxygen less - but wear it at night  See Dr. Patty SermonsBrackbill in one month (has a recall for May) - come fasting  Call the [facility] office at [phone] if you have any questions, problems or concerns.

## 2014-05-10 NOTE — Telephone Encounter (Signed)
Called patient left message to call back. Needs to come in for labs.

## 2014-05-10 NOTE — Telephone Encounter (Signed)
Follow up      Returning a nurses call.  Pt was seen this am

## 2014-05-10 NOTE — Addendum Note (Signed)
Addended by: Tonita PhoenixBOWDEN, ROBIN K on: 05/10/2014 09:04 AM   Modules accepted: Orders

## 2014-05-10 NOTE — Telephone Encounter (Signed)
Called patient back. Informed patient that he will need to come to office for a CBC and BNP. Patient verbalized understanding and will come have labs done today.

## 2014-05-10 NOTE — Progress Notes (Signed)
CARDIOLOGY OFFICE NOTE  Date:  05/10/2014    Justin Bartlett Date of Birth: [birth year] Medical Record #[id number]  PCP:  Thora Lance, MD  Cardiologist:  Patty Sermons    Chief Complaint  Patient presents with  . Congestive Heart Failure    TOC - Post hospital visit - seen for Dr. Patty Sermons.    History of Present Illness: Justin Bartlett is a 64 y.o. male who presents today for a TOC/post hospital visit. He is seen for Dr. Patty Sermons. He has a history of known ischemic heart disease. He had an anteroseptal myocardial infarction in 2001 and underwent coronary artery bypass graft surgery at that time. His last nuclear stress test 06/23/08 showed an old apical infarct with minimal reversible ischemia his ejection fraction was 41%. The patient has a history of hypertension, diabetes, and exogenous obesity. He is also followed by Dr. Luciana Axe for retinal disease. The patient is status post cholecystectomy. The patient has a known left carotid bruit. He has had hyperkalemia with ACE. His labs are typically followed by primary care.   He was last seen here in November. Hydralazine was started for better BP control.  I saw him 2 weeks ago in the [facility] with dsypnea - had been to his PCP - CHF on CXR. Lasix started. Very fatigued. No chest pain. I ordered an echo and Myoview.   After that visit - he presented to the ER - had worsening dyspnea and chest pain. Echo done there.   On admission, VSS except for initial oxygen saturation 89% on room air which improved to 97% with nasal cannula. Blood work was notable for mild leukocytosis of 12.0, creatinine of 2.29, troponin of 1.48 which peaked at 1.78, and potassium of 6. No acute ischemic changes were identified on 12-lead EKG. Cardiology was consulted for NSTEMI. He underwent elective cardiac cath.   Cardiac catheterization demonstrated severe native coronary artery disease with occluded LAD and distal RCA with subtotal occlusion more proximally in  the RCA. Also severe disease in the circumflex system.Occluded SVG-RPL-PDA, leaving no flow to the distal RCA system with exception of collaterals from the LAD. Widely patent LIMA-LAD to a very small caliber distal LAD with retrograde filling to a diagonal branch. Also widely patent sequential vein graft to 2 OM branches with minimal disease in the downstream vessels. He is to be medically managed - no culprit lesion was identified. Severe systemic hypertension noted with severely elevated LVEDP of 30-34 mmHg. He was diuresed with lasix 80mg  IV BID and lost about 9L during hospitalization. He was sent home on oxygen.   Comes in today. Here with his wife. He is on his oxygen. Walked to the exam room today (previously had to come in a wheelchair). He is doing much better. Not short of breath. No chest pain. Weight is down. BP running on the lower side but he is totally asymptomatic. No cough. Wants to get back to the gym. Wants to stop using oxygen. Really restricting his salt. He notes that overall, he is much better.   Past Medical History  Diagnosis Date  . CAD in native artery 02/1999    Subtotal LAD & servere RCA, ~40-50% LM --> CABG  . 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   . Hypercholesterolemia     takes Vytorin nightly  . Chronic kidney disease   . History of stomach ulcers  upon EGD with Dr. Adaline Sill Omeprazole daily  . Toenail fungus     Lamisil on feet daily and Sporanox daily  . Hypertension     takes Ramipril,Metoprolol,and Diltiazem daily  . Stroke 2006  . Peripheral edema     wears compression stockings daily  . Diabetes mellitus     insulin dependent;takes Humulin R  . Gallstones   . Obesity     Past Surgical History  Procedure Laterality Date  . Cardiac catheterization  02/09/1999    LARGE AREA OF ANTERIOR AND APICAL  HYPOKINESIS PRESENT. EF 35%  . Cardiovascular  stress test  06/2008    EF 41%  . Ankle surgery  1982    stainless steel rod and screws, LT ankle  . Ercp  06/13/2011    Procedure: ENDOSCOPIC RETROGRADE CHOLANGIOPANCREATOGRAPHY (ERCP);  Surgeon: Willis Modena, MD;  Location: [facility];  Service: Endoscopy;  Laterality: N/A;  Joni Reining requested to move pt due to another pt cancel  . Sphincterotomy  06/13/2011    Procedure: SPHINCTEROTOMY;  Surgeon: Willis Modena, MD;  Location: [facility];  Service: Endoscopy;;  . Biliary stent placement  06/13/2011    Procedure: BILIARY STENT PLACEMENT;  Surgeon: Willis Modena, MD;  Location: [facility];  Service: Endoscopy;  Laterality: N/A;  . Ercp  08/22/2011    Procedure: ENDOSCOPIC RETROGRADE CHOLANGIOPANCREATOGRAPHY (ERCP);  Surgeon: Willis Modena, MD;  Location: [facility];  Service: Endoscopy;  Laterality: N/A;  . Ercp  10/24/2011    Procedure: ENDOSCOPIC RETROGRADE CHOLANGIOPANCREATOGRAPHY (ERCP);  Surgeon: Willis Modena, MD;  Location: [facility];  Service: Endoscopy;  Laterality: N/A;  Need Mccall Pera to help with case, Need to order Lithotripter,    . Spyglass lithotripsy  10/24/2011    Procedure: ZOXWRUEA LITHOTRIPSY;  Surgeon: Willis Modena, MD;  Location: [facility];  Service: Endoscopy;  Laterality: N/A;  . Neck surgery  08/2003  . Coronary artery bypass graft  2001    x 5 vessels; LIMA-LAD, SeqSVG-OM1-dCX, SeqSVG-rVM-rPDA  . Bilateral knee surgery  12/1998  . Vasectomy  1995  . Bilateral cataract  surgery    . Esophagogastroduodenoscopy    . Colonoscopy    . Cholecystectomy  01/21/2012    Procedure: CHOLECYSTECTOMY;  Surgeon: Atilano Ina, MD,FACS;  Location: [facility] OR;  Service: General;  Laterality: N/A;  . Cholecystectomy  01/21/2012    Procedure: LAPAROSCOPIC CHOLECYSTECTOMY;  Surgeon: Atilano Ina, MD,FACS;  Location: [facility] OR;  Service: General;  Laterality: N/A;  attempted laparoscopic cholecystectomy  . Intraoperative cholangiogram  01/21/2012    Procedure: INTRAOPERATIVE  CHOLANGIOGRAM;  Surgeon: Atilano Ina, MD,FACS;  Location[facility];  Service: General;  Laterality: N/A;  . Left heart catheterization with coronary/graft angiogram N/A 04/29/2014    Procedure: LEFT HEART CATHETERIZATION WITH Isabel Caprice;  Surgeon: Marykay Lex, MD;  Location: [facility] CATH LAB;  Service: Cardiovascular;  Laterality: N/A;     Medications: Current Outpatient Prescriptions  Medication Sig Dispense Refill  . ACCU-CHEK AVIVA PLUS test strip as directed.    . cholecalciferol (VITAMIN D) 1000 UNITS tablet Take 2,000 Units by mouth daily.    Marland Kitchen dipyridamole-aspirin (AGGRENOX) 200-25 MG per 12 hr capsule Take 1 capsule by mouth 2 (two) times daily.    . furosemide (LASIX) 80 MG tablet Take 1 tablet (80 mg total) by mouth 2 (two) times daily. 60 tablet 0  . HUMULIN R 500 UNIT/ML SOLN injection Inject 0.16 Units into the skin 3 (three) times daily.  4  . hydrALAZINE (APRESOLINE) 100 MG  tablet Take 1 tablet (100 mg total) by mouth 3 (three) times daily. 90 tablet 0  . Insulin Syringe-Needle U-100 (INSULIN SYRINGE .3CC/29GX1/2") 29G X 1/2" 0.3 ML MISC Inject as directed as directed.    . isosorbide mononitrate (IMDUR) 120 MG 24 hr tablet Take 1 tablet (120 mg total) by mouth daily. 30 tablet 0  . metoprolol (LOPRESSOR) 100 MG tablet Take 100 mg by mouth 2 (two) times daily.     Marland Kitchen. PREVIDENT 5000 BOOSTER PLUS 1.1 % PSTE Take 1 application by mouth daily.  8  . sitaGLIPtin (JANUVIA) 50 MG tablet Take 50 mg by mouth daily.    Marland Kitchen. VYTORIN 10-80 MG per tablet Take 1 tablet by mouth.  6   No current facility-administered medications for this visit.    Allergies: Allergies  Allergen Reactions  . Actos [Pioglitazone] Other (See Comments)    Elevated kidney function  . Hctz [Hydrochlorothiazide] Other (See Comments)    BP issues.     Social History: The patient  reports that he has never smoked. He does not have any smokeless tobacco history on file. He reports that he does not  drink alcohol or use illicit drugs.   Family History: The patient's family history includes Cancer in his mother; Colon cancer in his mother; Diabetes in his sister; Heart attack in his father; Hypertension in his father and sister.   Review of Systems: Please see the history of present illness.     All other systems are reviewed and negative.   Physical Exam: VS:  BP 98/56 mmHg  Pulse 70  Resp 20  Ht 5\' 11"  (1.803 m)  Wt 273 lb (123.832 kg)  BMI 38.09 kg/m2  SpO2 93% .  BMI Body mass index is 38.09 kg/(m^2).   BP by me is 100/60.  Wt Readings from Last 3 Encounters:  05/10/14 273 lb (123.832 kg)  05/03/14 274 lb 1.6 oz (124.331 kg)  04/26/14 302 lb 9.6 oz (137.258 kg)    General: Pleasant. Well developed, well nourished and in no acute distress. His weight is down considerably.  HEENT: Normal. Neck: Supple, no JVD, carotid bruits, or masses noted.  Cardiac: Regular rate and rhythm. No murmurs, rubs, or gallops. No edema.  Respiratory:  Lungs are clear to auscultation bilaterally with normal work of breathing.  GI: Soft and nontender.  MS: No deformity or atrophy. Gait and ROM intact. Skin: Warm and dry. Color is normal.  Neuro:  Strength and sensation are intact and no gross focal deficits noted.  Psych: Alert, appropriate and with normal affect.   LABORATORY DATA:  EKG:  EKG is not ordered today.   Lab Results  Component Value Date   WBC 8.9 05/01/2014   HGB 12.8* 05/01/2014   HCT 42.6 05/01/2014   PLT 181 05/01/2014   GLUCOSE 264* 05/03/2014   ALT 22 04/28/2014   AST 22 04/28/2014   NA 138 05/03/2014   K 4.6 05/03/2014   CL 90* 05/03/2014   CREATININE 2.25* 05/03/2014   BUN 41* 05/03/2014   CO2 38* 05/03/2014   INR 1.20 04/28/2014   HGBA1C 9.2* 04/28/2014    BNP (last 3 results)  Recent Labs  04/27/14 1552  BNP 325.5*    ProBNP (last 3 results)  Recent Labs  04/26/14 1149  PROBNP 241.0*     Other Studies Reviewed Today:  Echo Study  Conclusions from March 2016  - Left ventricle: The cavity size was normal. Wall thickness was increased in a pattern  of mild LVH. Systolic function was normal. The estimated ejection fraction was in the range of 55% to 60%. Features are consistent with a pseudonormal left ventricular filling pattern, with concomitant abnormal relaxation and increased filling pressure (grade 2 diastolic dysfunction). - Mitral valve: There was mild regurgitation. - Left atrium: The atrium was mildly dilated.  Impressions:  - Poor acoustic windows limit study  PROCEDURES PERFORMED:   Left Heart Catheterization with Native Coronary And Graft Angiography via Right Common Femoral Artery Access  Left Ventriculography  Coronary Anatomy:  Dominance: Likely right  Left Main: Moderate caliber vessel that essentially continues as the circumflex. The LAD is 100% occluded at the left main. LIMA-LAD: Widely patent graft to the mid LAD. The distal LAD beyond the graft is a very small caliber vessel that reaches down to the apex in tapering fashion. There is also retrograde flow proximally to the occlusion site this provides flow to a significant diagonal branch. No significant disease in the LAD or diagonal but small in caliber. There are faint collaterals from septal perforators in the distal LAD to the RPDA.  Left Circumflex: Moderate caliber vessel that gives rise to 3 OM branches. The first and third branches have severe disease and appeared to be the grafted vessels. OM 3 is actually occluded. The following 1 circumflex beyond OM 3 provides mild collaterals to the posterior lateral system.  SVG-OM1-OM 3: Widely patent graft to moderate caliber OM branches. The first OM has minimal retrograde flow wears second has low back to the main circumflex and provides flow to the distal smaller circumflex. Both downstream grafted vessels are relatively free of disease.   RCA: Subtotal occlusion the very  proximal segment. After a roughly 20 no longer segment there is recanalization until just beyond the first artery marginal branch were again there is a subtotal occlusion. The vessel then again normalizes with potentially the bridging collaterals to be relatively normal in the mid segment until the crux. At about that point the vessels is totally occluded. The RPDA and posterior lateral system are not visualized from antegrade flow. SVG-RVM-PDA: 100% occluded at the ostium. There is some mild calcification noted in the occluded graft.  POST-OPERATIVE DIAGNOSIS:   Severe native coronary artery disease with occluded LAD and distal RCA with subtotal occlusion more proximally in the RCA. Also severe disease in the circumflex system. Aided vessels are very small caliber.  Occluded SVG-RPL-PDA, leaving no flow to the distal RCA system with exception of collaterals from the LAD.  Widely patent LIMA-LAD to a very small caliber distal LAD with retrograde filling to a diagonal branch. Also widely patent sequential vein graft to 2 OM branches with minimal disease in the downstream vessels.  Severe systemic hypertension with severely elevated LVEDP of 30-34 mmHg.  No obvious culprit for positive troponins and this would suggest likely demand in the setting of hypertensive urgency / acute on chronic diastolic heart failure  HARDING, Piedad Climes, M.D., M.S. Interventional Cardiologist    Assessment/Plan: 1. CAD - prior MI with remote CABG with recent NSTEMI, likely demand ischemia from heart failure exacerbation - Troponin level 1.48 --> 1.78 --> 0.79. No acute ischemic changes on 12-lead EKG. - Continued Aggrenox PO BID, metoprolol, and high dose statin - Cardiac cath: Severe multivessel disease of native arteries and grafts but no culprit lesion  He is having no symptoms. Looks good clinically - will stay on his current regimen.  2. Acute hypoxic respiratory failure secondary to acute diastolic heart  failure  with right sided pleural effusion - BNP 300 - Last 2-D echo on file in 2011 with ejection fraction of 40%: Currently EF 55-60% with grade 2 DD - Hydralazine and imdur added by cardiology  Sending for repeat CXR today. With ambulating here in the office - sats 94%. He may use his oxygen PRN during the day and wear at night continuously. I am cutting his Lasix back to just 80 mg once a day. Rechecking labs today as well.   3. Right-sided opacity likely due to pulmonary edema from heart failure - Chest x-ray shows residual RLL confluent opacity from 04/23/2014 study which could reflect effusion, atelectasis or pneumonia. - No reports of fevers at home. Mild elevation in white blood cell count likely reactive. - Strep pneumonia was negative. Legionella was negative - Blood cultures are NGTD - Antibiotics deferred - Continue incentive spirometry  He will go for repeat CXR today.   4. 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 - A1c 9.2   5. Essential hypertension - BP looks great. Not dizzy or lightheaded. He will continue to monitor at home. I have left him on his current regimen.  6. Chronic kidney disease stage IV, creatinine near baseline  7. Dyslipidemia  He would like to be on generic Lipitor - much cheaper than Vytorin - will stop Vytorin and start Lipitor 40 mg a day. Fasting labs on return.   8. Hx of stroke - Continue aggrenox  Current medicines are reviewed with the patient today.  The patient does not have concerns regarding medicines other than what has been noted above.  The following changes have been made:  See above.  Labs/ tests ordered today include:    Orders Placed This Encounter  Procedures  . DG Chest 2 View  . Brain natriuretic peptide  . Basic metabolic panel  . CBC     Disposition:   FU with Dr. Patty Sermons in 1 month with fasting labs.   Patient is agreeable to this plan and will call if any problems develop in the interim.   Signed: Rosalio Macadamia, RN, ANP-C 05/10/2014 9:25 AM  [facility] [address] [place], [place]  [address] Phone: [phone] Fax: [phone]

## 2014-05-11 ENCOUNTER — Other Ambulatory Visit: Payer: Self-pay

## 2014-05-11 DIAGNOSIS — I5043 Acute on chronic combined systolic (congestive) and diastolic (congestive) heart failure: Secondary | ICD-10-CM

## 2014-05-11 LAB — BASIC METABOLIC PANEL
BUN: 67 mg/dL — ABNORMAL HIGH (ref 6–23)
CO2: 33 mEq/L — ABNORMAL HIGH (ref 19–32)
Calcium: 9.7 mg/dL (ref 8.4–10.5)
Chloride: 92 mEq/L — ABNORMAL LOW (ref 96–112)
Creatinine, Ser: 2.88 mg/dL — ABNORMAL HIGH (ref 0.40–1.50)
GFR: 23.58 mL/min — ABNORMAL LOW (ref >60.00)
Glucose, Bld: 179 mg/dL — ABNORMAL HIGH (ref 70–99)
Potassium: 4.1 mEq/L (ref 3.5–5.1)
Sodium: 134 mEq/L — ABNORMAL LOW (ref 135–145)

## 2014-05-11 LAB — CBC
HCT: 46 % (ref 39.0–52.0)
Hemoglobin: 15 g/dL (ref 13.0–17.0)
MCHC: 32.6 g/dL (ref 30.0–36.0)
MCV: 86.4 fl (ref 78.0–100.0)
Platelets: 239 10*3/uL (ref 150.0–400.0)
RBC: 5.33 Mil/uL (ref 4.22–5.81)
RDW: 15.4 % (ref 11.5–15.5)
WBC: 10 10*3/uL (ref 4.0–10.5)

## 2014-05-11 LAB — BRAIN NATRIURETIC PEPTIDE: Pro B Natriuretic peptide (BNP): 36 pg/mL (ref 0.0–100.0)

## 2014-05-25 ENCOUNTER — Other Ambulatory Visit (INDEPENDENT_AMBULATORY_CARE_PROVIDER_SITE_OTHER): Payer: BC Managed Care – PPO | Admitting: *Deleted

## 2014-05-25 DIAGNOSIS — I5043 Acute on chronic combined systolic (congestive) and diastolic (congestive) heart failure: Secondary | ICD-10-CM | POA: Diagnosis not present

## 2014-05-25 LAB — BASIC METABOLIC PANEL
BUN: 30 mg/dL — ABNORMAL HIGH (ref 6–23)
CO2: 33 mEq/L — ABNORMAL HIGH (ref 19–32)
Calcium: 10.4 mg/dL (ref 8.4–10.5)
Chloride: 99 mEq/L (ref 96–112)
Creatinine, Ser: 2.02 mg/dL — ABNORMAL HIGH (ref 0.40–1.50)
GFR: 35.51 mL/min — ABNORMAL LOW (ref >60.00)
Glucose, Bld: 146 mg/dL — ABNORMAL HIGH (ref 70–99)
Potassium: 3.7 mEq/L (ref 3.5–5.1)
Sodium: 138 mEq/L (ref 135–145)

## 2014-05-25 NOTE — Addendum Note (Signed)
Addended by: Tonita PhoenixBOWDEN, ROBIN K on: 05/25/2014 10:56 AM   Modules accepted: Orders

## 2014-05-26 ENCOUNTER — Telehealth: Payer: Self-pay | Admitting: Nurse Practitioner

## 2014-05-26 ENCOUNTER — Other Ambulatory Visit: Payer: Self-pay | Admitting: *Deleted

## 2014-05-26 MED ORDER — FUROSEMIDE 40 MG PO TABS: 60.0000 mg | ORAL_TABLET | Freq: Every day | ORAL | Status: DC

## 2014-05-26 NOTE — Telephone Encounter (Signed)
New message ° ° ° ° °Returning a nurses call to get lab results °

## 2014-05-27 NOTE — Telephone Encounter (Signed)
Follow up      [facility] saying he needs oxygen for night time use only.

## 2014-05-27 NOTE — Telephone Encounter (Signed)
Routing to Norma FredricksonLori Gerhardt, NP, for follow up on O2 order for night time use.

## 2014-05-28 NOTE — Telephone Encounter (Signed)
Left message to call back  

## 2014-05-28 NOTE — Telephone Encounter (Signed)
This should go to Dr. Patty SermonsBrackbill.

## 2014-05-28 NOTE — Telephone Encounter (Signed)
Faxed signed order as requested.

## 2014-06-08 ENCOUNTER — Other Ambulatory Visit: Payer: Self-pay | Admitting: *Deleted

## 2014-06-08 MED ORDER — HYDRALAZINE HCL 100 MG PO TABS: 100.0000 mg | ORAL_TABLET | Freq: Three times a day (TID) | ORAL | Status: DC

## 2014-06-24 LAB — HM DIABETES EYE EXAM

## 2014-06-28 ENCOUNTER — Ambulatory Visit (INDEPENDENT_AMBULATORY_CARE_PROVIDER_SITE_OTHER): Payer: BC Managed Care – PPO | Admitting: Cardiology

## 2014-06-28 ENCOUNTER — Encounter: Payer: Self-pay | Admitting: Cardiology

## 2014-06-28 VITALS — BP 142/66 | HR 78 | Wt 278.8 lb

## 2014-06-28 DIAGNOSIS — R0989 Other specified symptoms and signs involving the circulatory and respiratory systems: Secondary | ICD-10-CM

## 2014-06-28 DIAGNOSIS — I5032 Chronic diastolic (congestive) heart failure: Secondary | ICD-10-CM | POA: Diagnosis not present

## 2014-06-28 DIAGNOSIS — I119 Hypertensive heart disease without heart failure: Secondary | ICD-10-CM

## 2014-06-28 DIAGNOSIS — I259 Chronic ischemic heart disease, unspecified: Secondary | ICD-10-CM | POA: Diagnosis not present

## 2014-06-28 MED ORDER — FUROSEMIDE 40 MG PO TABS: ORAL_TABLET | ORAL | Status: DC

## 2014-06-28 NOTE — Patient Instructions (Addendum)
Medication Instructions:  INCREASE YOUR LASIX (FUROSEMIDE) TO 40 MG 2 TABLETS BY MOUTH DAILY  Labwork: NONE  Testing/Procedures: NONE  Follow-Up: Your physician recommends that you schedule a follow-up appointment in: 3 MONTH OV

## 2014-06-28 NOTE — Progress Notes (Signed)
Cardiology Office Note   Date:  06/28/2014   ID:  Justin HighlandHenry L Sanzone, DOB 10/30/1950, MRN [id number]  PCP:  Thora LanceEHINGER,ROBERT R, MD  Cardiologist: Cassell Clementhomas Margrit Minner MD  No chief complaint on file.     History of Present Illness: Justin Bartlett is a 64 y.o. male who presents for 3 month follow-up office visit.  Justin HighlandHenry L Maguire is a 64 y.o. male who parents for a scheduled follow-up visit.Marland Kitchen. He has a history of known ischemic heart disease. He had an anteroseptal myocardial infarction in 2001 and underwent coronary artery bypass graft surgery at that time. His last nuclear stress test 06/23/08 showed an old apical infarct with minimal reversible ischemia his ejection fraction was 41%. The patient has a history of hypertension, diabetes, and exogenous obesity. He is also followed by Dr. Luciana Axeankin for retinal disease. The patient is status post cholecystectomy. The patient has a known left carotid bruit. He has had hyperkalemia with ACE. His labs are typically followed by primary care.    The patient was hospitalized on 04/27/14 for worsening CHF and fluid retention. On admission, VSS except for initial oxygen saturation 89% on room air which improved to 97% with nasal cannula. Blood work was notable for mild leukocytosis of 12.0, creatinine of 2.29, troponin of 1.48 which peaked at 1.78, and potassium of 6. No acute ischemic changes were identified on 12-lead EKG. Cardiology was consulted for NSTEMI. He underwent elective cardiac cath.  Cardiac catheterization demonstrated severe native coronary artery disease with occluded LAD and distal RCA with subtotal occlusion more proximally in the RCA. Also severe disease in the circumflex system.Occluded SVG-RPL-PDA, leaving no flow to the distal RCA system with exception of collaterals from the LAD. Widely patent LIMA-LAD to a very small caliber distal LAD with retrograde filling to a diagonal branch. Also widely patent sequential vein graft to 2 OM branches  with minimal disease in the downstream vessels. He is to be medically managed - no culprit lesion was identified. Severe systemic hypertension noted with severely elevated LVEDP of 30-34 mmHg. He was diuresed with lasix 80mg  IV BID and lost about 9L during hospitalization. He was sent home on oxygen. He is still using the oxygen at night.  He is not sure if he still needs it or not.  He denies any history of sleep apnea. His echocardiogram in March 2016 showed ejection fraction 55-60% with grade 2 diastolic dysfunction. The patient feels well.  He is exercising regularly.  He has a recombant bicycle that he uses.  He is not expressing any chest pain.  He is having some peripheral edema worse in the right leg than in the left leg.  His weight is up 3 pounds since last visit.  Past Medical History  Diagnosis Date  . CAD in native artery 02/1999    Subtotal LAD & servere RCA, ~40-50% LM --> CABG  . 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   . Hypercholesterolemia     takes Vytorin nightly  . Chronic kidney disease   . History of stomach ulcers     upon EGD with Dr. Adaline SillEdwards;takes Omeprazole daily  . Toenail fungus     Lamisil on feet daily and Sporanox daily  . Hypertension     takes Ramipril,Metoprolol,and Diltiazem daily  . Stroke 2006  . Peripheral edema     wears compression stockings daily  . Diabetes mellitus  insulin dependent;takes Humulin R  . Gallstones   . Obesity     Past Surgical History  Procedure Laterality Date  . Cardiac catheterization  02/09/1999    LARGE AREA OF ANTERIOR AND APICAL  HYPOKINESIS PRESENT. EF 35%  . Cardiovascular stress test  06/2008    EF 41%  . Ankle surgery  1982    stainless steel rod and screws, LT ankle  . Ercp  06/13/2011    Procedure: ENDOSCOPIC RETROGRADE CHOLANGIOPANCREATOGRAPHY (ERCP);  Surgeon: Willis Modena, MD;  Location: [facility];  Service:  Endoscopy;  Laterality: N/A;  Joni Reining requested to move pt due to another pt cancel  . Sphincterotomy  06/13/2011    Procedure: SPHINCTEROTOMY;  Surgeon: Willis Modena, MD;  Location: [facility];  Service: Endoscopy;;  . Biliary stent placement  06/13/2011    Procedure: BILIARY STENT PLACEMENT;  Surgeon: Willis Modena, MD;  Location: [facility];  Service: Endoscopy;  Laterality: N/A;  . Ercp  08/22/2011    Procedure: ENDOSCOPIC RETROGRADE CHOLANGIOPANCREATOGRAPHY (ERCP);  Surgeon: Willis Modena, MD;  Location: [facility];  Service: Endoscopy;  Laterality: N/A;  . Ercp  10/24/2011    Procedure: ENDOSCOPIC RETROGRADE CHOLANGIOPANCREATOGRAPHY (ERCP);  Surgeon: Willis Modena, MD;  Location: [facility];  Service: Endoscopy;  Laterality: N/A;  Need Mccall Pera to help with case, Need to order Lithotripter,    . Spyglass lithotripsy  10/24/2011    Procedure: ZOXWRUEA LITHOTRIPSY;  Surgeon: Willis Modena, MD;  Location: [facility];  Service: Endoscopy;  Laterality: N/A;  . Neck surgery  08/2003  . Coronary artery bypass graft  2001    x 5 vessels; LIMA-LAD, SeqSVG-OM1-dCX, SeqSVG-rVM-rPDA  . Bilateral knee surgery  12/1998  . Vasectomy  1995  . Bilateral cataract  surgery    . Esophagogastroduodenoscopy    . Colonoscopy    . Cholecystectomy  01/21/2012    Procedure: CHOLECYSTECTOMY;  Surgeon: Atilano Ina, MD,FACS;  Location: [facility] OR;  Service: General;  Laterality: N/A;  . Cholecystectomy  01/21/2012    Procedure: LAPAROSCOPIC CHOLECYSTECTOMY;  Surgeon: Atilano Ina, MD,FACS;  Location: [facility] OR;  Service: General;  Laterality: N/A;  attempted laparoscopic cholecystectomy  . Intraoperative cholangiogram  01/21/2012    Procedure: INTRAOPERATIVE CHOLANGIOGRAM;  Surgeon: Atilano Ina, MD,FACS;  Location: [facility] OR;  Service: General;  Laterality: N/A;  . Left heart catheterization with coronary/graft angiogram N/A 04/29/2014    Procedure: LEFT HEART CATHETERIZATION WITH Isabel Caprice;   Surgeon: Marykay Lex, MD;  Location: [facility] CATH LAB;  Service: Cardiovascular;  Laterality: N/A;     Current Outpatient Prescriptions  Medication Sig Dispense Refill  . ACCU-CHEK AVIVA PLUS test strip as directed.    Marland Kitchen atorvastatin (LIPITOR) 40 MG tablet Take 1 tablet (40 mg total) by mouth daily. 90 tablet 3  . cholecalciferol (VITAMIN D) 1000 UNITS tablet Take 3,000 Units by mouth daily.     Marland Kitchen dipyridamole-aspirin (AGGRENOX) 200-25 MG per 12 hr capsule Take 1 capsule by mouth 2 (two) times daily.    . furosemide (LASIX) 40 MG tablet 2 tablets by mouth daily 180 tablet 3  . HUMULIN R 500 UNIT/ML SOLN injection Inject 0.16 Units into the skin 3 (three) times daily.  4  . hydrALAZINE (APRESOLINE) 100 MG tablet Take 1 tablet (100 mg total) by mouth 3 (three) times daily. 90 tablet 0  . Insulin Syringe-Needle U-100 (INSULIN SYRINGE .3CC/29GX1/2") 29G X 1/2" 0.3 ML MISC Inject as directed as directed.    . isosorbide mononitrate (IMDUR) 120 MG 24  hr tablet Take 1 tablet (120 mg total) by mouth daily. 90 tablet 3  . metoprolol (LOPRESSOR) 100 MG tablet Take 100 mg by mouth 2 (two) times daily.     Marland Kitchen PREVIDENT 5000 BOOSTER PLUS 1.1 % PSTE Take 1 application by mouth daily.  8  . sitaGLIPtin (JANUVIA) 50 MG tablet Take 50 mg by mouth daily.     No current facility-administered medications for this visit.    Allergies:   Actos and Hctz    Social History:  The patient  reports that he has never smoked. He does not have any smokeless tobacco history on file. He reports that he does not drink alcohol or use illicit drugs.   Family History:  The patient's family history includes Cancer in his mother; Colon cancer in his mother; Diabetes in his sister; Heart attack in his father; Hypertension in his father and sister.    ROS:  Please see the history of present illness.   Otherwise, review of systems are positive for none.   All other systems are reviewed and negative.    PHYSICAL EXAM: VS:  BP  142/66 mmHg  Pulse 78  Wt 278 lb 12.8 oz (126.463 kg) , BMI Body mass index is 38.9 kg/(m^2). GEN: Well nourished, well developed, in no acute distress HEENT: normal Neck: no JVD, carotid bruits, or masses Cardiac: RRR; no murmurs, rubs, or gallops,no edema  Respiratory:  clear to auscultation bilaterally, normal work of breathing GI: soft, nontender, nondistended, + BS MS: no deformity or atrophy Skin: warm and dry, no rash Neuro:  Strength and sensation are intact Psych: euthymic mood, full affect   EKG:  EKG is ordered today. The ekg ordered today demonstrates normal sinus rhythm, borderline right axis deviation, nonspecific ST-T wave changes.  Since prior tracing of 04/28/14, no significant change   Recent Labs: 04/27/2014: B Natriuretic Peptide 325.5* 04/28/2014: ALT 22 05/10/2014: Hemoglobin 15.0; Platelets 239.0; Pro B Natriuretic peptide (BNP) 36.0 05/25/2014: BUN 30*; Creatinine 2.02*; Potassium 3.7; Sodium 138    Lipid Panel No results found for: CHOL, TRIG, HDL, CHOLHDL, VLDL, LDLCALC, LDLDIRECT    Wt Readings from Last 3 Encounters:  06/28/14 278 lb 12.8 oz (126.463 kg)  05/10/14 273 lb (123.832 kg)  05/03/14 274 lb 1.6 oz (124.331 kg)        ASSESSMENT AND PLAN:   Assessment/Plan: 1. CAD - prior MI with remote CABG with recent NSTEMI, likely demand ischemia from heart failure exacerbation - Cardiac cath: Severe multivessel disease of native arteries and grafts but no culprit lesion.  Continue medical therapy.  2. Acute hypoxic respiratory failure secondary to acute diastolic heart failure with right sided pleural effusion  - Hydralazine and imdur added by cardiology during recent admission  3. Right-sided opacity likely due to pulmonary edema from heart failure   4. Diabetes mellitus with renal manifestations, but uncontrolled at baseline -His diabetes is followed by Dr. Sharl Ma  5. Essential hypertension - BP looks great. Not dizzy or lightheaded. He  will continue to monitor at home. I have left him on his current regimen.  6. Chronic kidney disease stage IV, creatinine near baseline He is followed by nephrology.  7. Dyslipidemia  8. Hx of stroke - Continue aggrenox   Current medicines are reviewed at length with the patient today.  The patient does not have concerns regarding medicines.  The following changes have been made:  no change  Labs/ tests ordered today include:   Orders Placed This Encounter  Procedures  . EKG 12-Lead   Disposition: Increase furosemide to 80 mg daily instead of 60 because of peripheral edema..  Recheck in 3 months.  Karie Schwalbe MD 06/28/2014 2:03 PM    [facility] [address] [place], [place], [place]  [address] Phone: [phone]; Fax: [phone]

## 2014-07-03 ENCOUNTER — Other Ambulatory Visit: Payer: Self-pay | Admitting: Cardiology

## 2014-07-23 ENCOUNTER — Encounter: Payer: Self-pay | Admitting: Nurse Practitioner

## 2014-09-25 ENCOUNTER — Other Ambulatory Visit: Payer: Self-pay | Admitting: Cardiology

## 2014-10-15 ENCOUNTER — Ambulatory Visit (INDEPENDENT_AMBULATORY_CARE_PROVIDER_SITE_OTHER): Payer: BC Managed Care – PPO | Admitting: Cardiology

## 2014-10-15 ENCOUNTER — Encounter: Payer: Self-pay | Admitting: Cardiology

## 2014-10-15 VITALS — BP 130/68 | HR 71 | Ht 71.0 in | Wt 278.4 lb

## 2014-10-15 DIAGNOSIS — I5032 Chronic diastolic (congestive) heart failure: Secondary | ICD-10-CM | POA: Diagnosis not present

## 2014-10-15 DIAGNOSIS — I259 Chronic ischemic heart disease, unspecified: Secondary | ICD-10-CM

## 2014-10-15 DIAGNOSIS — E7800 Pure hypercholesterolemia, unspecified: Secondary | ICD-10-CM

## 2014-10-15 NOTE — Patient Instructions (Signed)
Medication Instructions:  Your physician recommends that you continue on your current medications as directed. Please refer to the Current Medication list given to you today.  Labwork: none  Testing/Procedures: none  Follow-Up: Your physician wants you to follow-up in: 6 month ov/ekg  You will receive a reminder letter in the mail two months in advance. If you don't receive a letter, please call our office to schedule the follow-up appointment.     

## 2014-10-15 NOTE — Progress Notes (Signed)
Cardiology Office Note   Date:  10/15/2014   ID:  Justin Bartlett, DOB 07-10-1950, MRN [id number]  PCP:  Thora Lance, MD  Cardiologist: Cassell Clement MD  No chief complaint on file.     History of Present Illness: Justin Bartlett is a 64 y.o. male who presents for scheduled follow-up office visit.  The patient was hospitalized on 04/27/14 for worsening CHF and fluid retention. On admission, VSS except for initial oxygen saturation 89% on room air which improved to 97% with nasal cannula. Blood work was notable for mild leukocytosis of 12.0, creatinine of 2.29, troponin of 1.48 which peaked at 1.78, and potassium of 6. No acute ischemic changes were identified on 12-lead EKG. Cardiology was consulted for NSTEMI. He underwent elective cardiac cath.  Cardiac catheterization demonstrated severe native coronary artery disease with occluded LAD and distal RCA with subtotal occlusion more proximally in the RCA. Also severe disease in the circumflex system.Occluded SVG-RPL-PDA, leaving no flow to the distal RCA system with exception of collaterals from the LAD. Widely patent LIMA-LAD to a very small caliber distal LAD with retrograde filling to a diagonal branch. Also widely patent sequential vein graft to 2 OM branches with minimal disease in the downstream vessels. He is to be medically managed - no culprit lesion was identified. Severe systemic hypertension noted with severely elevated LVEDP of 30-34 mmHg. He was diuresed with lasix  IV BID and lost about 9L during hospitalization.  His echocardiogram in March 2016 showed ejection fraction 55-60% with grade 2 diastolic dysfunction. The patient feels well. He is exercising regularly.He has a treadmill.  He also does core exercises.  His weight is unchanged since last visit. The patient has a known left carotid bruit.  His last duplex was in 2015.  He would like to delay his next duplex until he goes on Medicare in March 2017  because of the expense.  He has not been having any TIA symptoms.   Past Medical History  Diagnosis Date  . CAD in native artery 02/1999    Subtotal LAD & servere RCA, ~40-50% LM --> CABG  . 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   . Hypercholesterolemia     takes Vytorin nightly  . Chronic kidney disease   . History of stomach ulcers     upon EGD with Dr. Adaline Sill Omeprazole daily  . Toenail fungus     Lamisil on feet daily and Sporanox daily  . Hypertension     takes Ramipril,Metoprolol,and Diltiazem daily  . Stroke 2006  . Peripheral edema     wears compression stockings daily  . Diabetes mellitus     insulin dependent;takes Humulin R  . Gallstones   . Obesity     Past Surgical History  Procedure Laterality Date  . Cardiac catheterization  02/09/1999    LARGE AREA OF ANTERIOR AND APICAL  HYPOKINESIS PRESENT. EF 35%  . Cardiovascular stress test  06/2008    EF 41%  . Ankle surgery  1982    stainless steel rod and screws, LT ankle  . Ercp  06/13/2011    Procedure: ENDOSCOPIC RETROGRADE CHOLANGIOPANCREATOGRAPHY (ERCP);  Surgeon: Willis Modena, MD;  Location: [facility];  Service: Endoscopy;  Laterality: N/A;  Joni Reining requested to move pt due to another pt cancel  . Sphincterotomy  06/13/2011    Procedure: SPHINCTEROTOMY;  Surgeon: Willis Modena, MD;  Location: [facility]  ENDOS[facility];  Service: Endoscopy;;  . Biliary stent placement  06/13/2011    Procedure: BILIARY STENT PLACEMENT;  Surgeon: Willis Modena, MD;  Location: [facility];  Service: Endoscopy;  Laterality: N/A;  . Ercp  08/22/2011    Procedure: ENDOSCOPIC RETROGRADE CHOLANGIOPANCREATOGRAPHY (ERCP);  Surgeon: Willis Modena, MD;  Location: [facility];  Service: Endoscopy;  Laterality: N/A;  . Ercp  10/24/2011    Procedure: ENDOSCOPIC RETROGRADE CHOLANGIOPANCREATOGRAPHY (ERCP);  Surgeon: Willis Modena, MD;  Location: [facility];   Service: Endoscopy;  Laterality: N/A;  Need Mccall Pera to help with case, Need to order Lithotripter,    . Spyglass lithotripsy  10/24/2011    Procedure: [id number] LITHOTRIPSY;  Surgeon: Willis Modena, MD;  Location: [facility];  Service: Endoscopy;  Laterality: N/A;  . Neck surgery  08/2003  . Coronary artery bypass graft  2001    x 5 vessels; LIMA-LAD, SeqSVG-OM1-dCX, SeqSVG-rVM-rPDA  . Bilateral knee surgery  12/1998  . Vasectomy  1995  . Bilateral cataract  surgery    . Esophagogastroduodenoscopy    . Colonoscopy    . Cholecystectomy  01/21/2012    Procedure: CHOLECYSTECTOMY;  Surgeon: Atilano Ina, MD,FACS;  Location: [facility] OR;  Service: General;  Laterality: N/A;  . Cholecystectomy  01/21/2012    Procedure: LAPAROSCOPIC CHOLECYSTECTOMY;  Surgeon: Atilano Ina, MD,FACS;  Location: [facility] OR;  Service: General;  Laterality: N/A;  attempted laparoscopic cholecystectomy  . Intraoperative cholangiogram  01/21/2012    Procedure: INTRAOPERATIVE CHOLANGIOGRAM;  Surgeon: Atilano Ina, MD,FACS;  Location: [facility] OR;  Service: General;  Laterality: N/A;  . Left heart catheterization with coronary/graft angiogram N/A 04/29/2014    Procedure: LEFT HEART CATHETERIZATION WITH Isabel Caprice;  Surgeon: Marykay Lex, MD;  Location: [facility] CATH LAB;  Service: Cardiovascular;  Laterality: N/A;     Current Outpatient Prescriptions  Medication Sig Dispense Refill  . ACCU-CHEK AVIVA PLUS test strip as directed.    Marland Kitchen atorvastatin (LIPITOR) 40 MG tablet Take 1 tablet (40 mg total) by mouth daily. 90 tablet 3  . cholecalciferol (VITAMIN D) 1000 UNITS tablet Take 3,000 Units by mouth daily.     Marland Kitchen dipyridamole-aspirin (AGGRENOX) 200-25 MG per 12 hr capsule Take 1 capsule by mouth 2 (two) times daily.    . furosemide (LASIX) 40 MG tablet 2 tablets by mouth daily 180 tablet 3  . HUMULIN R U-500 KWIKPEN 500 UNIT/ML injection Inject 18 Units into the skin 2 (two) times daily. Inject 18 units under the skin in  the morning and at night and inject 16 units under the skin at lunch time  6  . hydrALAZINE (APRESOLINE) 100 MG tablet TAKE 1 TABLET BY MOUTH THREE TIMES DAILY 270 tablet 0  . Insulin Syringe-Needle U-100 (INSULIN SYRINGE .3CC/29GX1/2") 29G X 1/2" 0.3 ML MISC Inject as directed as directed.    . isosorbide mononitrate (IMDUR) 120 MG 24 hr tablet Take 1 tablet (120 mg total) by mouth daily. 90 tablet 3  . metoprolol (LOPRESSOR) 100 MG tablet Take 100 mg by mouth 2 (two) times daily.     Marland Kitchen PREVIDENT 5000 BOOSTER PLUS 1.1 % PSTE Take 1 application by mouth daily.  8  . sitaGLIPtin (JANUVIA) 50 MG tablet Take 50 mg by mouth daily.     No current facility-administered medications for this visit.    Allergies:   Actos and Hctz    Social History:  The patient  reports that he has never smoked. He does not have any smokeless tobacco history on file.  He reports that he does not drink alcohol or use illicit drugs.   Family History:  The patient's family history includes Cancer in his mother; Colon cancer in his mother; Diabetes in his sister; Heart attack in his father; Hypertension in his father and sister.    ROS:  Please see the history of present illness.   Otherwise, review of systems are positive for none.   All other systems are reviewed and negative.    PHYSICAL EXAM: VS:  BP 130/68 mmHg  Pulse 71  Ht 5\' 11"  (1.803 m)  Wt 278 lb 6.4 oz (126.281 kg)  BMI 38.85 kg/m2 , BMI Body mass index is 38.85 kg/(m^2). GEN: Well nourished, well developed, in no acute distress HEENT: normal Neck: no JVD, there is a soft left carotid bruit Cardiac: RRR; no murmurs, rubs, or gallops, there is 1+ pretibial edema which goes down at night Respiratory:  clear to auscultation bilaterally, normal work of breathing GI: soft, nontender, nondistended, + BS MS: no deformity or atrophy Skin: warm and dry, no rash Neuro:  Strength and sensation are intact Psych: euthymic mood, full affect   EKG:  EKG is not  ordered today.    Recent Labs: 04/27/2014: B Natriuretic Peptide 325.5* 04/28/2014: ALT 22 05/10/2014: Hemoglobin 15.0; Platelets 239.0; Pro B Natriuretic peptide (BNP) 36.0 05/25/2014: BUN 30*; Creatinine, Ser 2.02*; Potassium 3.7; Sodium 138    Lipid Panel No results found for: CHOL, TRIG, HDL, CHOLHDL, VLDL, LDLCALC, LDLDIRECT    Wt Readings from Last 3 Encounters:  10/15/14 278 lb 6.4 oz (126.281 kg)  06/28/14 278 lb 12.8 oz (126.463 kg)  05/10/14 273 lb (123.832 kg)        ASSESSMENT AND PLAN:  1. CAD - prior MI with remote CABG with recent NSTEMI, likely demand ischemia from heart failure exacerbation - Cardiac cath: Severe multivessel disease of native arteries and grafts but no culprit lesion. Continue medical therapy. The patient has not been having any recurrent chest pain or angina.  His exercise tolerance is good.  2. Acute hypoxic respiratory failure secondary to acute diastolic heart failure with right sided pleural effusion  - Hydralazine and imdur added by cardiology during recent admission.  His nephrologist recently cut back on his Lasix dosage because of worsening kidney disease  3. Right-sided opacity likely due to pulmonary edema from heart failure   4. Diabetes mellitus with renal manifestations, but uncontrolled at baseline -His diabetes is followed by Dr. Sharl Ma  5. Essential hypertension - BP looks great. Not dizzy or lightheaded. He will continue to monitor at home. I have left him on his current regimen.  6. Chronic kidney disease stage IV, creatinine near baseline He is followed by nephrology.  7. Dyslipidemia.  His lipids are followed by his PCP  8. Hx of stroke - Continue aggrenox   Current medicines are reviewed at length with the patient today.  The patient does not have concerns regarding medicines.  The following changes have been made:  no change  Labs/ tests ordered today include:  No orders of the defined types were placed in  this encounter.    Physician: Continue current medication.  Recheck in 6 months for office visit and EKG.  After next visit plan to get an update of his carotid duplex.  Karie Schwalbe MD 10/15/2014 8:46 AM    [facility] [address] [place], [place], [place]  [address] Phone: [phone]; Fax: [phone]

## 2014-12-25 ENCOUNTER — Other Ambulatory Visit: Payer: Self-pay | Admitting: Cardiology

## 2015-03-24 ENCOUNTER — Ambulatory Visit: Payer: BC Managed Care – PPO | Admitting: Cardiology

## 2015-04-19 ENCOUNTER — Ambulatory Visit (INDEPENDENT_AMBULATORY_CARE_PROVIDER_SITE_OTHER): Payer: Medicare Other | Admitting: Nurse Practitioner

## 2015-04-19 ENCOUNTER — Ambulatory Visit: Payer: BC Managed Care – PPO | Admitting: Nurse Practitioner

## 2015-04-19 ENCOUNTER — Encounter: Payer: Self-pay | Admitting: Nurse Practitioner

## 2015-04-19 VITALS — BP 170/90 | HR 64 | Ht 71.0 in | Wt 274.8 lb

## 2015-04-19 DIAGNOSIS — E7800 Pure hypercholesterolemia, unspecified: Secondary | ICD-10-CM | POA: Diagnosis not present

## 2015-04-19 DIAGNOSIS — I739 Peripheral vascular disease, unspecified: Secondary | ICD-10-CM

## 2015-04-19 DIAGNOSIS — I779 Disorder of arteries and arterioles, unspecified: Secondary | ICD-10-CM | POA: Diagnosis not present

## 2015-04-19 DIAGNOSIS — I259 Chronic ischemic heart disease, unspecified: Secondary | ICD-10-CM | POA: Diagnosis not present

## 2015-04-19 NOTE — Progress Notes (Signed)
CARDIOLOGY OFFICE NOTE  Date:  04/19/2015    Justin Bartlett Date of Birth: [birth year] Medical Record #[id number]  PCP:  Thora Lance, MD  Cardiologist:  Former patient of Dr. Yevonne Pax.     Chief Complaint  Patient presents with  . Coronary Artery Disease  . Hypertension  . Hyperlipidemia  . Congestive Heart Failure    Follow up visit - former patient of Dr. Yevonne Pax.     History of Present Illness: Justin Bartlett is a 65 y.o. male who presents today for a follow up visit. Former patient of Dr. Yevonne Pax.  He has a history of known ischemic heart disease. He had an anteroseptal myocardial infarction in 2001 and underwent coronary artery bypass graft surgery at that time. His last nuclear stress test 06/23/08 showed an old apical infarct with minimal reversible ischemia his ejection fraction was 41%. Other issues include a history of hypertension, diabetes, and exogenous obesity. He is also followed by Dr. Luciana Axe for retinal disease. The patient is status post cholecystectomy. The patient has known carotid disease bilaterally. He has had hyperkalemia with ACE. His labs are typically followed by primary care.   Presented back in March of 2016 with NSTEMI - felt to be from demand ischemia due to heart failure exacerbation. Cardiac catheterization demonstrated severe native coronary artery disease with occluded LAD and distal RCA with subtotal occlusion more proximally in the RCA. Also severe disease in the circumflex system.Occluded SVG-RPL-PDA, leaving no flow to the distal RCA system with exception of collaterals from the LAD. Widely patent LIMA-LAD to a very small caliber distal LAD with retrograde filling to a diagonal branch. Also widely patent sequential vein graft to 2 OM branches with minimal disease in the downstream vessels. He was to be medically managed - no culprit lesion was identified. Severe systemic hypertension noted with severely elevated LVEDP of 30-34  mmHg. He was diuresed with lasix 80mg  IV BID and lost about 9L during that hospitalization. He was sent home on oxygen.   He was last seen back in September and was felt to be doing well.   Comes in today. Here alone. Doing ok. No chest pain. Breathing is good. BP typically has been good at home. Saw his renal doctor in [place] and BP was good and she was happy with his reading there. Has had his medicines today. He is exercising more regularly. Has lost a few pounds. Labs are checked by Dr. Manus Gunning. He his happy with how he is doing.    Past Medical History  Diagnosis Date  . CAD in native artery 02/1999    Subtotal LAD & servere RCA, ~40-50% LM --> CABG  . 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   . Hypercholesterolemia     takes Vytorin nightly  . Chronic kidney disease   . History of stomach ulcers     upon EGD with Dr. Adaline Sill Omeprazole daily  . Toenail fungus     Lamisil on feet daily and Sporanox daily  . Hypertension     takes Ramipril,Metoprolol,and Diltiazem daily  . Stroke [facility]) 2006  . Peripheral edema     wears compression stockings daily  . Diabetes mellitus     insulin dependent;takes Humulin R  . Gallstones   . Obesity     Past Surgical History  Procedure Laterality Date  . Cardiac catheterization  02/09/1999    LARGE  AREA OF ANTERIOR AND APICAL  HYPOKINESIS PRESENT. EF 35%  . Cardiovascular stress test  06/2008    EF 41%  . Ankle surgery  1982    stainless steel rod and screws, LT ankle  . Ercp  06/13/2011    Procedure: ENDOSCOPIC RETROGRADE CHOLANGIOPANCREATOGRAPHY (ERCP);  Surgeon: Willis Modena, MD;  Location: [facility];  Service: Endoscopy;  Laterality: N/A;  Joni Reining requested to move pt due to another pt cancel  . Sphincterotomy  06/13/2011    Procedure: SPHINCTEROTOMY;  Surgeon: Willis Modena, MD;  Location: [facility];  Service: Endoscopy;;  . Biliary  stent placement  06/13/2011    Procedure: BILIARY STENT PLACEMENT;  Surgeon: Willis Modena, MD;  Location: [facility] ENDOSCOPY;  Service: Endoscopy;  Laterality: N/A;  . Ercp  08/22/2011    Procedure: ENDOSCOPIC RETROGRADE CHOLANGIOPANCREATOGRAPHY (ERCP);  Surgeon: Willis Modena, MD;  Location: [facility];  Service: Endoscopy;  Laterality: N/A;  . Ercp  10/24/2011    Procedure: ENDOSCOPIC RETROGRADE CHOLANGIOPANCREATOGRAPHY (ERCP);  Surgeon: Willis Modena, MD;  Location: [facility];  Service: Endoscopy;  Laterality: N/A;  Need Mccall Pera to help with case, Need to order Lithotripter,    . Spyglass lithotripsy  10/24/2011    Procedure: ZOXWRUEA LITHOTRIPSY;  Surgeon: Willis Modena, MD;  Location: [facility] ENDOS[facility];  Service: Endoscopy;  Laterality: N/A;  . Neck surgery  08/2003  . Coronary artery bypass graft  2001    x 5 vessels; LIMA-LAD, SeqSVG-OM1-dCX, SeqSVG-rVM-rPDA  . Bilateral knee surgery  12/1998  . Vasectomy  1995  . Bilateral cataract  surgery    . Esophagogastroduodenoscopy    . Colonoscopy    . Cholecystectomy  01/21/2012    Procedure: CHOLECYSTECTOMY;  Surgeon: Atilano Ina, MD,FACS;  Location: [facility] OR;  Service: General;  Laterality: N/A;  . Cholecystectomy  01/21/2012    Procedure: LAPAROSCOPIC CHOLECYSTECTOMY;  Surgeon: Atilano Ina, MD,FACS;  Location: [facility] OR;  Service: General;  Laterality: N/A;  attempted laparoscopic cholecystectomy  . Intraoperative cholangiogram  01/21/2012    Procedure: INTRAOPERATIVE CHOLANGIOGRAM;  Surgeon: Atilano Ina, MD,FACS;  Location: [facility] OR;  Service: General;  Laterality: N/A;  . Left heart catheterization with coronary/graft angiogram N/A 04/29/2014    Procedure: LEFT HEART CATHETERIZATION WITH Isabel Caprice;  Surgeon: Marykay Lex, MD;  Location: [facility] CATH LAB;  Service: Cardiovascular;  Laterality: N/A;     Medications: Current Outpatient Prescriptions  Medication Sig Dispense Refill  . ACCU-CHEK AVIVA PLUS test strip as directed.     Marland Kitchen atorvastatin (LIPITOR) 40 MG tablet Take 1 tablet (40 mg total) by mouth daily. 90 tablet 3  . cholecalciferol (VITAMIN D) 1000 UNITS tablet Take 3,000 Units by mouth daily.     Marland Kitchen dipyridamole-aspirin (AGGRENOX) 200-25 MG per 12 hr capsule Take 1 capsule by mouth 2 (two) times daily.    . furosemide (LASIX) 40 MG tablet 2 tablets by mouth daily 180 tablet 3  . HUMULIN R U-500 KWIKPEN 500 UNIT/ML injection Inject 18 Units into the skin 2 (two) times daily. Inject 18 units under the skin in the morning and at night and inject 16 units under the skin at lunch time  6  . hydrALAZINE (APRESOLINE) 100 MG tablet TAKE 1 TABLET BY MOUTH THREE TIMES DAILY 270 tablet 0  . Insulin Syringe-Needle U-100 (INSULIN SYRINGE .3CC/29GX1/2") 29G X 1/2" 0.3 ML MISC Inject as directed as directed.    . isosorbide mononitrate (IMDUR) 120 MG 24 hr tablet Take 1 tablet (120 mg total) by mouth daily. 90  tablet 3  . metoprolol (LOPRESSOR) 100 MG tablet Take 100 mg by mouth 2 (two) times daily.     Marland Kitchen PREVIDENT 5000 BOOSTER PLUS 1.1 % PSTE Take 1 application by mouth daily.  8  . sitaGLIPtin (JANUVIA) 50 MG tablet Take 50 mg by mouth daily.     No current facility-administered medications for this visit.    Allergies: Allergies  Allergen Reactions  . Actos [Pioglitazone] Other (See Comments)    Elevated kidney function  . Hctz [Hydrochlorothiazide] Other (See Comments)    BP issues.     Social History: The patient  reports that he has never smoked. He does not have any smokeless tobacco history on file. He reports that he does not drink alcohol or use illicit drugs.   Family History: The patient's family history includes Cancer in his mother; Colon cancer in his mother; Diabetes in his sister; Heart attack in his father; Hypertension in his father and sister.   Review of Systems: Please see the history of present illness.   Otherwise, the review of systems is positive for none.   All other systems are reviewed  and negative.   Physical Exam: VS:  BP 170/90 mmHg  Pulse 64  Ht 5\' 11"  (1.803 m)  Wt 274 lb 12.8 oz (124.648 kg)  BMI 38.34 kg/m2 .  BMI Body mass index is 38.34 kg/(m^2).  Wt Readings from Last 3 Encounters:  04/19/15 274 lb 12.8 oz (124.648 kg)  10/15/14 278 lb 6.4 oz (126.281 kg)  06/28/14 278 lb 12.8 oz (126.463 kg)   BP is 150/70 by me.   General: Pleasant. Chronically ill appearing but alert and in no acute distress. Remains obese.  HEENT: Normal. Neck: Supple, no JVD, carotid bruits, or masses noted.  Cardiac: Regular rate and rhythm. His heart tones are distant.  No edema.  Respiratory:  Lungs are clear to auscultation bilaterally with normal work of breathing.  GI: Soft and nontender.  MS: No deformity or atrophy. Gait and ROM intact. Skin: Warm and dry. Color is normal.  Neuro:  Strength and sensation are intact and no gross focal deficits noted.  Psych: Alert, appropriate and with normal affect.   LABORATORY DATA:  EKG:  EKG is not ordered today.  Lab Results  Component Value Date   WBC 10.0 05/10/2014   HGB 15.0 05/10/2014   HCT 46.0 05/10/2014   PLT 239.0 05/10/2014   GLUCOSE 146* 05/25/2014   ALT 22 04/28/2014   AST 22 04/28/2014   NA 138 05/25/2014   K 3.7 05/25/2014   CL 99 05/25/2014   CREATININE 2.02* 05/25/2014   BUN 30* 05/25/2014   CO2 33* 05/25/2014   INR 1.20 04/28/2014   HGBA1C 9.2* 04/28/2014    BNP (last 3 results)  Recent Labs  04/27/14 1552  BNP 325.5*    ProBNP (last 3 results)  Recent Labs  04/26/14 1149 05/10/14 1618  PROBNP 241.0* 36.0     Other Studies Reviewed Today:  Echo Study Conclusions from March 2016  - Left ventricle: The cavity size was normal. Wall thickness was increased in a pattern of mild LVH. Systolic function was normal. The estimated ejection fraction was in the range of 55% to 60%. Features are consistent with a pseudonormal left ventricular filling pattern, with concomitant  abnormal relaxation and increased filling pressure (grade 2 diastolic dysfunction). - Mitral valve: There was mild regurgitation. - Left atrium: The atrium was mildly dilated.  Impressions:  - Poor acoustic windows limit  study  PROCEDURES PERFORMED:   Left Heart Catheterization with Native Coronary And Graft Angiography via Right Common Femoral Artery Access  Left Ventriculography  Coronary Anatomy:  Dominance: Likely right  Left Main: Moderate caliber vessel that essentially continues as the circumflex. The LAD is 100% occluded at the left main. LIMA-LAD: Widely patent graft to the mid LAD. The distal LAD beyond the graft is a very small caliber vessel that reaches down to the apex in tapering fashion. There is also retrograde flow proximally to the occlusion site this provides flow to a significant diagonal branch. No significant disease in the LAD or diagonal but small in caliber. There are faint collaterals from septal perforators in the distal LAD to the RPDA.  Left Circumflex: Moderate caliber vessel that gives rise to 3 OM branches. The first and third branches have severe disease and appeared to be the grafted vessels. OM 3 is actually occluded. The following 1 circumflex beyond OM 3 provides mild collaterals to the posterior lateral system.  SVG-OM1-OM 3: Widely patent graft to moderate caliber OM branches. The first OM has minimal retrograde flow wears second has low back to the main circumflex and provides flow to the distal smaller circumflex. Both downstream grafted vessels are relatively free of disease.   RCA: Subtotal occlusion the very proximal segment. After a roughly 20 no longer segment there is recanalization until just beyond the first artery marginal branch were again there is a subtotal occlusion. The vessel then again normalizes with potentially the bridging collaterals to be relatively normal in the mid segment until the crux. At about that point the  vessels is totally occluded. The RPDA and posterior lateral system are not visualized from antegrade flow. SVG-RVM-PDA: 100% occluded at the ostium. There is some mild calcification noted in the occluded graft.  POST-OPERATIVE DIAGNOSIS:   Severe native coronary artery disease with occluded LAD and distal RCA with subtotal occlusion more proximally in the RCA. Also severe disease in the circumflex system. Aided vessels are very small caliber.  Occluded SVG-RPL-PDA, leaving no flow to the distal RCA system with exception of collaterals from the LAD.  Widely patent LIMA-LAD to a very small caliber distal LAD with retrograde filling to a diagonal branch. Also widely patent sequential vein graft to 2 OM branches with minimal disease in the downstream vessels.  Severe systemic hypertension with severely elevated LVEDP of 30-34 mmHg.  No obvious culprit for positive troponins and this would suggest likely demand in the setting of hypertensive urgency / acute on chronic diastolic heart failure  HARDING, Piedad ClimesAVID W, M.D., M.S. Interventional Cardiologist    Assessment/Plan: 1. CAD - prior MI with remote CABG with NSTEMI from 04/2014, likely demand ischemia from heart failure exacerbation - his cath showed severe multivessel disease of native arteries and grafts but no culprit lesion - he is managed medically.  He is having no symptoms. Looks good clinically - will stay on his current regimen.  2. Chronic diastolic HF - compensated. No change with current regimen.   3. Essential hypertension - BP better at home. Recheck by me is a little better. Would have him continue to monitor for now. No change with his regimen.  4. Chronic kidney disease stage IV - seeing Nephrology regularly.   5. Dyslipidemia - on statin - labs checked by PCP  6. Hx of stroke - Continue aggrenox  7. Bilateral carotid disease - needs repeat duplex study. Will arrange.   Current medicines are reviewed with the patient  today.  The patient does not have concerns regarding medicines other than what has been noted above.  The following changes have been made:  See above.  Labs/ tests ordered today include:   No orders of the defined types were placed in this encounter.     Disposition:   FU with me in 6 months.    Patient is agreeable to this plan and will call if any problems develop in the interim.   Signed: Rosalio Macadamia, RN, ANP-C 04/19/2015 8:51 AM  [facility] [address] [place], [place]  [address] Phone: [phone] Fax: [phone]

## 2015-04-19 NOTE — Patient Instructions (Addendum)
We will be checking the following labs today - NONE   Medication Instructions:    Continue with your current medicines.     Testing/Procedures To Be Arranged:  Carotid dopplers  Follow-Up:   See me in 6 months.     Other Special Instructions:   Continue to monitor your blood pressure for us and keep a diary.     If you need a refill on your cardiac medications before your next appointment, please call your pharmacy.   Call [facility] office at [phone] if you have any questions, problems or concerns.

## 2015-04-25 ENCOUNTER — Encounter (HOSPITAL_COMMUNITY): Payer: Medicare Other

## 2015-05-01 ENCOUNTER — Other Ambulatory Visit: Payer: Self-pay | Admitting: Nurse Practitioner

## 2015-05-09 ENCOUNTER — Ambulatory Visit (HOSPITAL_COMMUNITY)
Admission: RE | Admit: 2015-05-09 | Discharge: 2015-05-09 | Disposition: A | Discharge status: Home / Self Care | Payer: Medicare Other | Source: Ambulatory Visit | Attending: Cardiology | Admitting: Cardiology

## 2015-05-09 DIAGNOSIS — I779 Disorder of arteries and arterioles, unspecified: Secondary | ICD-10-CM | POA: Insufficient documentation

## 2015-05-09 DIAGNOSIS — E1122 Type 2 diabetes mellitus with diabetic chronic kidney disease: Secondary | ICD-10-CM | POA: Insufficient documentation

## 2015-05-09 DIAGNOSIS — N189 Chronic kidney disease, unspecified: Secondary | ICD-10-CM | POA: Insufficient documentation

## 2015-05-09 DIAGNOSIS — I6523 Occlusion and stenosis of bilateral carotid arteries: Secondary | ICD-10-CM | POA: Insufficient documentation

## 2015-05-09 DIAGNOSIS — I2510 Atherosclerotic heart disease of native coronary artery without angina pectoris: Secondary | ICD-10-CM | POA: Insufficient documentation

## 2015-05-09 DIAGNOSIS — E669 Obesity, unspecified: Secondary | ICD-10-CM | POA: Diagnosis not present

## 2015-05-09 DIAGNOSIS — I129 Hypertensive chronic kidney disease with stage 1 through stage 4 chronic kidney disease, or unspecified chronic kidney disease: Secondary | ICD-10-CM | POA: Insufficient documentation

## 2015-05-09 DIAGNOSIS — Z6838 Body mass index (BMI) 38.0-38.9, adult: Secondary | ICD-10-CM | POA: Insufficient documentation

## 2015-05-09 DIAGNOSIS — E7800 Pure hypercholesterolemia, unspecified: Secondary | ICD-10-CM | POA: Diagnosis not present

## 2015-05-09 DIAGNOSIS — I739 Peripheral vascular disease, unspecified: Secondary | ICD-10-CM

## 2015-05-16 ENCOUNTER — Other Ambulatory Visit: Payer: Self-pay | Admitting: *Deleted

## 2015-05-16 MED ORDER — ISOSORBIDE MONONITRATE ER 120 MG PO TB24: 120.0000 mg | ORAL_TABLET | Freq: Every day | ORAL | Status: DC

## 2015-06-23 ENCOUNTER — Other Ambulatory Visit: Payer: Self-pay

## 2015-06-23 MED ORDER — FUROSEMIDE 40 MG PO TABS: ORAL_TABLET | ORAL | Status: DC

## 2015-06-23 MED ORDER — HYDRALAZINE HCL 100 MG PO TABS: 100.0000 mg | ORAL_TABLET | Freq: Three times a day (TID) | ORAL | Status: DC

## 2015-10-06 ENCOUNTER — Encounter: Payer: Self-pay | Admitting: Nurse Practitioner

## 2015-10-11 ENCOUNTER — Ambulatory Visit: Payer: Medicare Other | Admitting: Nurse Practitioner

## 2015-10-24 ENCOUNTER — Encounter: Payer: Self-pay | Admitting: Nurse Practitioner

## 2015-10-24 ENCOUNTER — Ambulatory Visit (INDEPENDENT_AMBULATORY_CARE_PROVIDER_SITE_OTHER): Payer: Medicare Other | Admitting: Nurse Practitioner

## 2015-10-24 VITALS — BP 140/64 | HR 69 | Ht 71.0 in | Wt 285.1 lb

## 2015-10-24 DIAGNOSIS — I5042 Chronic combined systolic (congestive) and diastolic (congestive) heart failure: Secondary | ICD-10-CM | POA: Diagnosis not present

## 2015-10-24 DIAGNOSIS — I2510 Atherosclerotic heart disease of native coronary artery without angina pectoris: Secondary | ICD-10-CM | POA: Diagnosis not present

## 2015-10-24 DIAGNOSIS — I259 Chronic ischemic heart disease, unspecified: Secondary | ICD-10-CM | POA: Diagnosis not present

## 2015-10-24 NOTE — Progress Notes (Signed)
CARDIOLOGY OFFICE NOTE  Date:  10/24/2015    Justin Bartlett Date of Birth: 1950/05/01 Medical Record #[id number]  PCP:  Thora Lance, MD  Cardiologist:  Tyrone Sage   Chief Complaint  Patient presents with  . Coronary Artery Disease    6 month check - former patient of Dr. Yevonne Pax    History of Present Illness: Justin Bartlett is a 65 y.o. male who presents today for a 6 month check. Former patient of Dr. Yevonne Pax.  He is followed by me.   He has a history of known ischemic heart disease. He had an anteroseptal myocardial infarction in 2001 and underwent coronary artery bypass graft surgery at that time. His last nuclear stress test 06/23/08 showed an old apical infarct with minimal reversible ischemia his ejection fraction was 41%. Other issues include a history of hypertension, diabetes, and exogenous obesity. He is also followed by Dr. Luciana Axe for retinal disease. The patient is status post cholecystectomy. The patient has known carotid disease bilaterally. He has had hyperkalemia with ACE. His labs are typically followed by primary care.   Presented back in March of 2016 with NSTEMI - felt to be from demand ischemia due to heart failure exacerbation. Cardiac catheterization demonstrated severe native coronary artery disease with occluded LAD and distal RCA with subtotal occlusion more proximally in the RCA. Also severe disease in the circumflex system.Occluded SVG-RPL-PDA, leaving no flow to the distal RCA system with exception of collaterals from the LAD. Widely patent LIMA-LAD to a very small caliber distal LAD with retrograde filling to a diagonal branch. Also widely patent sequential vein graft to 2 OM branches with minimal disease in the downstream vessels. He was to be medically managed - no culprit lesion was identified. Severe systemic hypertension noted with severely elevated LVEDP of 30-34 mmHg. He was diuresed with lasix 80mg  IV BID and lost about 9L during  that hospitalization. He was sent home on oxygen.   He was last seen back in March and was felt to be doing well. Got his carotids updated. Has 40 to 59% on the right and 60 to 79% on the left.   Comes in today. Here alone. He feels like he is doing well. Working with [employer] about 35 hours a week. He is quite active - walking on the treadmill about 4 times a week. No chest pain. Not short of breath. Not dizzy or lightheaded. He feels good on his medicines. Has gained weight and he admits that his diet needs to be better. Snacking more during work hours so that his blood sugar won't drop down. Labs are checked by PCP and Dr. Sharl Ma. Sees [facility]al thru [facility]. Blood pressure has been doing well at home.   Past Medical History:  Diagnosis Date  . CAD in native artery 02/1999   Subtotal LAD & servere RCA, ~40-50% LM --> CABG  . Chronic kidney disease   . Diabetes mellitus    insulin dependent;takes Humulin R  . Gallstones   . History of stomach ulcers    upon EGD with Dr. Adaline Sill Omeprazole daily  . Hypercholesterolemia    takes Vytorin nightly  . Hypertension    takes Ramipril,Metoprolol,and Diltiazem daily  . Obesity   . Peripheral edema    wears compression stockings daily  . 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   . Stroke (HCC) 2006  . Toenail fungus  Lamisil on feet daily and Sporanox daily    Past Surgical History:  Procedure Laterality Date  . ANKLE SURGERY  1982   stainless steel rod and screws, LT ankle  . bilateral cataract  surgery    . bilateral knee surgery  12/1998  . BILIARY STENT PLACEMENT  06/13/2011   Procedure: BILIARY STENT PLACEMENT;  Surgeon: Willis ModenaWilliam Outlaw, MD;  Location: [facility];  Service: Endoscopy;  Laterality: N/A;  . CARDIAC CATHETERIZATION  02/09/1999   LARGE AREA OF ANTERIOR AND APICAL  HYPOKINESIS PRESENT. EF 35%  . CARDIOVASCULAR STRESS TEST  06/2008     EF 41%  . CHOLECYSTECTOMY  01/21/2012   Procedure: CHOLECYSTECTOMY;  Surgeon: Atilano InaEric M Wilson, MD,FACS;  Location: [facility] OR;  Service: General;  Laterality: N/A;  . CHOLECYSTECTOMY  01/21/2012   Procedure: LAPAROSCOPIC CHOLECYSTECTOMY;  Surgeon: Atilano InaEric M Wilson, MD,FACS;  Location: [facility] OR;  Service: General;  Laterality: N/A;  attempted laparoscopic cholecystectomy  . COLONOSCOPY    . CORONARY ARTERY BYPASS GRAFT  2001   x 5 vessels; LIMA-LAD, SeqSVG-OM1-dCX, SeqSVG-rVM-rPDA  . ERCP  06/13/2011   Procedure: ENDOSCOPIC RETROGRADE CHOLANGIOPANCREATOGRAPHY (ERCP);  Surgeon: Willis ModenaWilliam Outlaw, MD;  [facility];  Service: Endoscopy;  Laterality: N/A;  Joni Reiningicole requested to move pt due to another pt cancel  . ERCP  08/22/2011   Procedure: ENDOSCOPIC RETROGRADE CHOLANGIOPANCREATOGRAPHY (ERCP);  Surgeon: Willis ModenaWilliam Outlaw, MD;  [facility];  Service: Endoscopy;  Laterality: N/A;  . ERCP  10/24/2011   Procedure: ENDOSCOPIC RETROGRADE CHOLANGIOPANCREATOGRAPHY (ERCP);  Surgeon: Willis ModenaWilliam Outlaw, MD;  [facility];  Service: Endoscopy;  Laterality: N/A;  Need Mccall Pera to help with case, Need to order Lithotripter,    . ESOPHAGOGASTRODUODENOSCOPY    . INTRAOPERATIVE CHOLANGIOGRAM  01/21/2012   Procedure: INTRAOPERATIVE CHOLANGIOGRAM;  Surgeon: Atilano InaEric M Wilson, MD,FACS;  [facility];  Service: General;  Laterality: N/A;  . LEFT HEART CATHETERIZATION WITH CORONARY/GRAFT ANGIOGRAM N/A 04/29/2014   Procedure: LEFT HEART CATHETERIZATION WITH Isabel CapriceORONARY/GRAFT ANGIOGRAM;  Surgeon: Marykay Lexavid W Harding, MD;  [facility];  Service: Cardiovascular;  Laterality: N/A;  . NECK SURGERY  08/2003  . SPHINCTEROTOMY  06/13/2011   Procedure: SPHINCTEROTOMY;  Surgeon: Willis ModenaWilliam Outlaw, MD;  [facility];  Service: Endoscopy;;  . Burman FreestoneSPYGLASS LITHOTRIPSY  10/24/2011   Procedure: ZOXWRUEASPYGLASS LITHOTRIPSY;  Surgeon: Willis ModenaWilliam Outlaw, MD;  Location: [facility];  Service: Endoscopy;  Laterality: N/A;  . VASECTOMY   1995     Medications: Current Outpatient Prescriptions  Medication Sig Dispense Refill  . ACCU-CHEK AVIVA PLUS test strip as directed.    Marland Kitchen. atorvastatin (LIPITOR) 40 MG tablet TAKE 1 TABLET(40 MG) BY MOUTH DAILY 90 tablet 3  . cholecalciferol (VITAMIN D) 1000 UNITS tablet Take 3,000 Units by mouth daily.     Marland Kitchen. dipyridamole-aspirin (AGGRENOX) 200-25 MG per 12 hr capsule Take 1 capsule by mouth 2 (two) times daily.    . furosemide (LASIX) 40 MG tablet 2 tablets by mouth daily 180 tablet 3  . HUMULIN R U-500 KWIKPEN 500 UNIT/ML injection Inject 18 Units into the skin 2 (two) times daily. Inject 18 units under the skin in the morning and at night and inject 16 units under the skin at lunch time  6  . hydrALAZINE (APRESOLINE) 100 MG tablet Take 1 tablet (100 mg total) by mouth 3 (three) times daily. 270 tablet 3  . Insulin Syringe-Needle U-100 (INSULIN SYRINGE .3CC/29GX1/2") 29G X 1/2" 0.3 ML MISC Inject as directed as directed.    . isosorbide mononitrate (  IMDUR) 120 MG 24 hr tablet Take 1 tablet (120 mg total) by mouth daily. 90 tablet 1  . metoprolol (LOPRESSOR) 100 MG tablet Take 100 mg by mouth 2 (two) times daily.     Marland Kitchen PREVIDENT 5000 BOOSTER PLUS 1.1 % PSTE Take 1 application by mouth daily.  8  . sitaGLIPtin (JANUVIA) 50 MG tablet Take 50 mg by mouth daily.     No current facility-administered medications for this visit.     Allergies: Allergies  Allergen Reactions  . Actos [Pioglitazone] Other (See Comments)    Elevated kidney function  . Hctz [Hydrochlorothiazide] Other (See Comments)    BP issues.     Social History: The patient  reports that he has never smoked. He has never used smokeless tobacco. He reports that he does not drink alcohol or use drugs.   Family History: The patient's family history includes Cancer in his mother; Colon cancer in his mother; Diabetes in his sister; Heart attack in his father; Hypertension in his father and sister.   Review of  Systems: Please see the history of present illness.   Otherwise, the review of systems is positive for none.   All other systems are reviewed and negative.   Physical Exam: VS:  BP 140/64   Pulse 69   Ht 5\' 11"  (1.803 m)   Wt 285 lb 1.9 oz (129.3 kg)   BMI 39.77 kg/m  .  BMI Body mass index is 39.77 kg/m.  Wt Readings from Last 3 Encounters:  10/24/15 285 lb 1.9 oz (129.3 kg)  04/19/15 274 lb 12.8 oz (124.6 kg)  10/15/14 278 lb 6.4 oz (126.3 kg)    Repeat BP by me is 135/70.  General: Pleasant. Obese male who is alert and in no acute distress. Weight is up 9 pounds. Looks a little older than his stated age.  HEENT: Normal.  Neck: Supple, no JVD, carotid bruits, or masses noted.  Cardiac: Regular rate and rhythm. No murmurs, rubs, or gallops. No edema.  Respiratory:  Lungs are clear to auscultation bilaterally with normal work of breathing.  GI: Soft and nontender.  MS: No deformity or atrophy. Gait and ROM intact.  Skin: Warm and dry. Color is normal.  Neuro:  Strength and sensation are intact and no gross focal deficits noted.  Psych: Alert, appropriate and with normal affect.   LABORATORY DATA:  EKG:  EKG is ordered today. This demonstrates NSR with septal Qs and lateral T wave changes - unchanged.  Lab Results  Component Value Date   WBC 10.0 05/10/2014   HGB 15.0 05/10/2014   HCT 46.0 05/10/2014   PLT 239.0 05/10/2014   GLUCOSE 146 (H) 05/25/2014   ALT 22 04/28/2014   AST 22 04/28/2014   NA 138 05/25/2014   K 3.7 05/25/2014   CL 99 05/25/2014   CREATININE 2.02 (H) 05/25/2014   BUN 30 (H) 05/25/2014   CO2 33 (H) 05/25/2014   INR 1.20 04/28/2014   HGBA1C 9.2 (H) 04/28/2014    BNP (last 3 results) No results for input(s): BNP in the last 8760 hours.  ProBNP (last 3 results) No results for input(s): PROBNP in the last 8760 hours.   Other Studies Reviewed Today:  Echo Study Conclusions from March 2016  - Left ventricle: The cavity size was normal. Wall  thickness was increased in a pattern of mild LVH. Systolic function was normal. The estimated ejection fraction was in the range of 55% to 60%. Features are consistent with  a pseudonormal left ventricular filling pattern, with concomitant abnormal relaxation and increased filling pressure (grade 2 diastolic dysfunction). - Mitral valve: There was mild regurgitation. - Left atrium: The atrium was mildly dilated.  Impressions:  - Poor acoustic windows limit study  PROCEDURES PERFORMED:   Left Heart Catheterization with Native Coronary And Graft Angiography via Right Common Femoral Artery Access  Left Ventriculography  Coronary Anatomy:  Dominance: Likely right  Left Main: Moderate caliber vessel that essentially continues as the circumflex. The LAD is 100% occluded at the left main. LIMA-LAD: Widely patent graft to the mid LAD. The distal LAD beyond the graft is a very small caliber vessel that reaches down to the apex in tapering fashion. There is also retrograde flow proximally to the occlusion site this provides flow to a significant diagonal branch. No significant disease in the LAD or diagonal but small in caliber. There are faint collaterals from septal perforators in the distal LAD to the RPDA.  Left Circumflex: Moderate caliber vessel that gives rise to 3 OM branches. The first and third branches have severe disease and appeared to be the grafted vessels. OM 3 is actually occluded. The following 1 circumflex beyond OM 3 provides mild collaterals to the posterior lateral system.  SVG-OM1-OM 3: Widely patent graft to moderate caliber OM branches. The first OM has minimal retrograde flow wears second has low back to the main circumflex and provides flow to the distal smaller circumflex. Both downstream grafted vessels are relatively free of disease.   RCA: Subtotal occlusion the very proximal segment. After a roughly 20 no longer segment there is recanalization  until just beyond the first artery marginal branch were again there is a subtotal occlusion. The vessel then again normalizes with potentially the bridging collaterals to be relatively normal in the mid segment until the crux. At about that point the vessels is totally occluded. The RPDA and posterior lateral system are not visualized from antegrade flow. SVG-RVM-PDA: 100% occluded at the ostium. There is some mild calcification noted in the occluded graft.  POST-OPERATIVE DIAGNOSIS:   Severe native coronary artery disease with occluded LAD and distal RCA with subtotal occlusion more proximally in the RCA. Also severe disease in the circumflex system. Aided vessels are very small caliber.  Occluded SVG-RPL-PDA, leaving no flow to the distal RCA system with exception of collaterals from the LAD.  Widely patent LIMA-LAD to a very small caliber distal LAD with retrograde filling to a diagonal branch. Also widely patent sequential vein graft to 2 OM branches with minimal disease in the downstream vessels.  Severe systemic hypertension with severely elevated LVEDP of 30-34 mmHg.  No obvious culprit for positive troponins and this would suggest likely demand in the setting of hypertensive urgency / acute on chronic diastolic heart failure  HARDING, Piedad Climes, M.D., M.S. Interventional Cardiologist    Assessment/Plan: 1. CAD - prior MI with remote CABG with NSTEMI from 04/2014, likely demand ischemia from heart failure exacerbation - his cath showed severe multivessel disease of native arteries and grafts but no culprit lesion - he is managed medically.  He is having no symptoms. Looks good clinically but needs to work on weight loss - will stay on his current regimen.  2. Chronic diastolic HF - compensated. No change with current regimen.   3. Essential hypertension - BP better at home. Recheck by me is a little better. Would have him continue to monitor for now. No change with his  regimen.  4. Chronic kidney  disease stage IV - seeing Nephrology regularly.   5. Dyslipidemia - on statin - labs checked by PCP  6. Hx of stroke - Continue aggrenox  7. Bilateral carotid disease - needs repeat duplex study back in April of 2018  Current medicines are reviewed with the patient today.  The patient does not have concerns regarding medicines other than what has been noted above.  The following changes have been made:  See above.  Labs/ tests ordered today include:    Orders Placed This Encounter  Procedures  . EKG 12-Lead     Disposition:   FU with me in 6 months.   Patient is agreeable to this plan and will call if any problems develop in the interim.   Signed: Rosalio Macadamia, RN, ANP-C 10/24/2015 8:50 AM  [facility] [address] [place], [place]  [address] Phone: [phone] Fax: [phone]

## 2015-10-24 NOTE — Patient Instructions (Signed)
We will be checking the following labs today - NONE   Medication Instructions:    Continue with your current medicines.     Testing/Procedures To Be Arranged:  N/A  Follow-Up:   See me in 6 months    Other Special Instructions:   Try to work on your diet and weight for me  Keep exercising    If you need a refill on your cardiac medications before your next appointment, please call your pharmacy.   Call the [facility] office at [phone] if you have any questions, problems or concerns.

## 2015-11-12 ENCOUNTER — Other Ambulatory Visit: Payer: Self-pay | Admitting: Nurse Practitioner

## 2015-11-25 ENCOUNTER — Other Ambulatory Visit: Payer: Self-pay | Admitting: Family Medicine

## 2015-11-25 ENCOUNTER — Other Ambulatory Visit: Payer: Self-pay

## 2015-11-25 ENCOUNTER — Ambulatory Visit
Admission: RE | Admit: 2015-11-25 | Discharge: 2015-11-25 | Disposition: A | Discharge status: Home / Self Care | Payer: Medicare Other | Source: Ambulatory Visit | Attending: Family Medicine | Admitting: Family Medicine

## 2015-11-25 DIAGNOSIS — M25552 Pain in left hip: Secondary | ICD-10-CM

## 2016-04-12 ENCOUNTER — Other Ambulatory Visit: Payer: Self-pay | Admitting: Nurse Practitioner

## 2016-04-16 ENCOUNTER — Encounter: Payer: Self-pay | Admitting: Nurse Practitioner

## 2016-04-16 ENCOUNTER — Ambulatory Visit (INDEPENDENT_AMBULATORY_CARE_PROVIDER_SITE_OTHER): Payer: Medicare Other | Admitting: Nurse Practitioner

## 2016-04-16 VITALS — BP 110/72 | HR 72 | Ht 71.0 in | Wt 258.0 lb

## 2016-04-16 DIAGNOSIS — I2510 Atherosclerotic heart disease of native coronary artery without angina pectoris: Secondary | ICD-10-CM | POA: Diagnosis not present

## 2016-04-16 DIAGNOSIS — I5042 Chronic combined systolic (congestive) and diastolic (congestive) heart failure: Secondary | ICD-10-CM | POA: Diagnosis not present

## 2016-04-16 NOTE — Patient Instructions (Addendum)
We will be checking the following labs today - NONE   Medication Instructions:    Continue with your current medicines.     Testing/Procedures To Be Arranged:  Carotid dopplers in April of 2018  Follow-Up:   See me in 6 months    Other Special Instructions:   Keep a check on your BP for me and let me know if you have any dizzy spells.     If you need a refill on your cardiac medications before your next appointment, please call your pharmacy.   Call the [facility] office at [phone] if you have any questions, problems or concerns.

## 2016-04-16 NOTE — Progress Notes (Signed)
CARDIOLOGY OFFICE NOTE  Date:  04/16/2016    Justin Bartlett Date of Birth: 1950/02/16 Medical Record #[id number]  PCP:  Thora Lance, MD  Cardiologist:  Tyrone Sage   Chief Complaint  Patient presents with  . Coronary Artery Disease    Follow up visit     History of Present Illness: Justin Bartlett is a 66 y.o. male who presents today for a 7 month check. ormer patient of Dr. Yevonne Pax. He is now followed by me.   He has a history of known ischemic heart disease. He had an anteroseptal myocardial infarction in 2001 and underwent coronary artery bypass graft surgery at that time. His last nuclear stress test 06/23/08 showed an old apical infarct with minimal reversible ischemia his ejection fraction was 41%. Other issues include a history of hypertension, diabetes, and exogenous obesity. He is also followed by Dr. Luciana Axe for retinal disease. The patient is status post cholecystectomy. The patient has known carotid disease bilaterally. He has had hyperkalemia with ACE. His labs are typically followed by primary care.   Presented back in March of 2016 with NSTEMI - felt to be from demand ischemia due to heart failure exacerbation. Cardiac catheterization demonstrated severe native coronary artery disease with occluded LAD and distal RCA with subtotal occlusion more proximally in the RCA. Also severe disease in the circumflex system.Occluded SVG-RPL-PDA, leaving no flow to the distal RCA system with exception of collaterals from the LAD. Widely patent LIMA-LAD to a very small caliber distal LAD with retrograde filling to a diagonal branch. Also widely patent sequential vein graft to 2 OM branches with minimal disease in the downstream vessels. He was to be medically managed - no culprit lesion was identified. Severe systemic hypertension noted with severely elevated LVEDP of 30-34 mmHg. He was diuresed with lasix 80mg  IV BID and lost about 9L during that hospitalization. He was  sent home on oxygen.   He has done well since that admission.   Seen back last March and was felt to be doing well. Got his carotids updated. Has 40 to 59% on the right and 60 to 79% on the left. Last visit with me was back in September and he was felt to be doing ok.   Comes in today. Here with his wife today. He is doing ok but notes his current quality of life is not good. Still working at [employer] but less hours.  He is actively losing weight - wanting to get his hip replaced but weight loss has been advised. He has had more hip pain that has limited his mobility. Was not able to work last week due to hip pain. He has had some injections with short term relief - getting another. Understands that he is at higher risk for problems given his cardiac issues and other medical issues. No chest pain. Breathing is ok. He can still get on the treadmill and walk some. Not dizzy or lightheaded. Recent A1C was 7.2. Labs are done by PCP.   Past Medical History:  Diagnosis Date  . CAD in native artery 02/1999   Subtotal LAD & servere RCA, ~40-50% LM --> CABG  . Chronic kidney disease   . Diabetes mellitus    insulin dependent;takes Humulin R  . Gallstones   . History of stomach ulcers    upon EGD with Dr. Adaline Sill Omeprazole daily  . Hypercholesterolemia    takes Vytorin nightly  . Hypertension    takes Ramipril,Metoprolol,and Diltiazem daily  .  Obesity   . Peripheral edema    wears compression stockings daily  . 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   . Stroke (HCC) 2006  . Toenail fungus    Lamisil on feet daily and Sporanox daily    Past Surgical History:  Procedure Laterality Date  . ANKLE SURGERY  1982   stainless steel rod and screws, LT ankle  . bilateral cataract  surgery    . bilateral knee surgery  12/1998  . BILIARY STENT PLACEMENT  06/13/2011   Procedure: BILIARY STENT PLACEMENT;   Surgeon: Willis Modena, MD;  Location: [facility];  Service: Endoscopy;  Laterality: N/A;  . CARDIAC CATHETERIZATION  02/09/1999   LARGE AREA OF ANTERIOR AND APICAL  HYPOKINESIS PRESENT. EF 35%  . CARDIOVASCULAR STRESS TEST  06/2008   EF 41%  . CHOLECYSTECTOMY  01/21/2012   Procedure: CHOLECYSTECTOMY;  Surgeon: Atilano Ina, MD,FACS;  Location: [facility] OR;  Service: General;  Laterality: N/A;  . CHOLECYSTECTOMY  01/21/2012   Procedure: LAPAROSCOPIC CHOLECYSTECTOMY;  Surgeon: Atilano Ina, MD,FACS;  Location: [facility] OR;  Service: General;  Laterality: N/A;  attempted laparoscopic cholecystectomy  . COLONOSCOPY    . CORONARY ARTERY BYPASS GRAFT  2001   x 5 vessels; LIMA-LAD, SeqSVG-OM1-dCX, SeqSVG-rVM-rPDA  . ERCP  06/13/2011   Procedure: ENDOSCOPIC RETROGRADE CHOLANGIOPANCREATOGRAPHY (ERCP);  Surgeon: Willis Modena, MD;  Location: [facility];  Service: Endoscopy;  Laterality: N/A;  Joni Reining requested to move pt due to another pt cancel  . ERCP  08/22/2011   Procedure: ENDOSCOPIC RETROGRADE CHOLANGIOPANCREATOGRAPHY (ERCP);  Surgeon: Willis Modena, MD;  Location: [facility];  Service: Endoscopy;  Laterality: N/A;  . ERCP  10/24/2011   Procedure: ENDOSCOPIC RETROGRADE CHOLANGIOPANCREATOGRAPHY (ERCP);  Surgeon: Willis Modena, MD;  Location: [facility];  Service: Endoscopy;  Laterality: N/A;  Need Mccall Pera to help with case, Need to order Lithotripter,    . ESOPHAGOGASTRODUODENOSCOPY    . INTRAOPERATIVE CHOLANGIOGRAM  01/21/2012   Procedure: INTRAOPERATIVE CHOLANGIOGRAM;  Surgeon: Atilano Ina, MD,FACS;  Location: [facility] OR;  Service: General;  Laterality: N/A;  . LEFT HEART CATHETERIZATION WITH CORONARY/GRAFT ANGIOGRAM N/A 04/29/2014   Procedure: LEFT HEART CATHETERIZATION WITH Isabel Caprice;  Surgeon: Marykay Lex, MD;  Location: [facility] CATH LAB;  Service: Cardiovascular;  Laterality: N/A;  . NECK SURGERY  08/2003  . SPHINCTEROTOMY  06/13/2011   Procedure: SPHINCTEROTOMY;  Surgeon: Willis Modena, MD;  Location: [facility];  Service: Endoscopy;;  . Burman Freestone LITHOTRIPSY  10/24/2011   Procedure: ZOXWRUEA LITHOTRIPSY;  Surgeon: Willis Modena, MD;  Location: [facility];  Service: Endoscopy;  Laterality: N/A;  . VASECTOMY  1995     Medications: Current Outpatient Prescriptions  Medication Sig Dispense Refill  . ACCU-CHEK AVIVA PLUS test strip as directed.    Marland Kitchen atorvastatin (LIPITOR) 40 MG tablet TAKE 1 TABLET(40 MG) BY MOUTH DAILY 90 tablet 0  . cholecalciferol (VITAMIN D) 1000 UNITS tablet Take 3,000 Units by mouth daily.     Marland Kitchen dipyridamole-aspirin (AGGRENOX) 200-25 MG per 12 hr capsule Take 1 capsule by mouth 2 (two) times daily.    . furosemide (LASIX) 40 MG tablet 2 tablets by mouth daily 180 tablet 3  . HUMULIN R U-500 KWIKPEN 500 UNIT/ML injection Inject 18 Units into the skin 2 (two) times daily. Inject 18 units under the skin in the morning and at night and inject 16 units under the skin at lunch time  6  . hydrALAZINE (APRESOLINE) 100 MG tablet Take 1 tablet (100 mg total) by mouth 3 (three) times daily. 270 tablet 3  . Insulin Syringe-Needle U-100 (INSULIN SYRINGE .3CC/29GX1/2") 29G X 1/2" 0.3 ML MISC Inject as directed as directed.    . isosorbide mononitrate (IMDUR) 120 MG 24 hr tablet TAKE 1 TABLET(120 MG) BY MOUTH DAILY 90 tablet 2  . metoprolol (LOPRESSOR) 100 MG tablet Take 100 mg by mouth 2 (two) times daily.     . sitaGLIPtin (JANUVIA) 50 MG tablet Take 50 mg by mouth daily.     No current facility-administered medications for this visit.     Allergies: Allergies  Allergen Reactions  . Actos [Pioglitazone] Other (See Comments)    Elevated kidney function  . Hctz [Hydrochlorothiazide] Other (See Comments)    BP issues.     Social History: The patient  reports that he has never smoked. He has never used smokeless tobacco. He reports that he does not drink alcohol or use drugs.   Family History: The patient's family history includes Cancer in his  mother; Colon cancer in his mother; Diabetes in his sister; Heart attack in his father; Hypertension in his father and sister.   Review of Systems: Please see the history of present illness.   Otherwise, the review of systems is positive for none.   All other systems are reviewed and negative.   Physical Exam: VS:  BP 110/72   Pulse 72   Ht 5\' 11"  (1.803 m)   Wt 258 lb (117 kg)   BMI 35.98 kg/m  .  BMI Body mass index is 35.98 kg/m.  Wt Readings from Last 3 Encounters:  04/16/16 258 lb (117 kg)  10/24/15 285 lb 1.9 oz (129.3 kg)  04/19/15 274 lb 12.8 oz (124.6 kg)    General: Pleasant. He looks chronically ill. Older than his stated age.   He has lost weight. Down 27 pounds since my last visit. Color chronically sallow.  HEENT: Normal.  Neck: Supple, no JVD, carotid bruits, or masses noted.  Cardiac: Regular rate and rhythm. Systolic murmur. No edema.  Respiratory:  Lungs are clear to auscultation bilaterally with normal work of breathing.  GI: Soft and nontender.  MS: No deformity or atrophy. Gait and ROM intact.  Skin: Warm and dry. Color is normal.  Neuro:  Strength and sensation are intact and no gross focal deficits noted.  Psych: Alert, appropriate and with normal affect.   LABORATORY DATA:  EKG:  EKG is ordered today. This demonstrates NSR with lateral ST and T wave changes - unchanged.  Lab Results  Component Value Date   WBC 10.0 05/10/2014   HGB 15.0 05/10/2014   HCT 46.0 05/10/2014   PLT 239.0 05/10/2014   GLUCOSE 146 (H) 05/25/2014   ALT 22 04/28/2014   AST 22 04/28/2014   NA 138 05/25/2014   K 3.7 05/25/2014   CL 99 05/25/2014   CREATININE 2.02 (H) 05/25/2014   BUN 30 (H) 05/25/2014   CO2 33 (H) 05/25/2014   INR 1.20 04/28/2014   HGBA1C 9.2 (H) 04/28/2014    BNP (last 3 results) No results for input(s): BNP in the last 8760 hours.  ProBNP (last 3 results) No results for input(s): PROBNP in the last 8760 hours.   Other Studies Reviewed  Today:  Echo Study Conclusions from March 2016  - Left ventricle: The cavity size was normal. Wall thickness was increased in a pattern of mild LVH. Systolic function was normal.  The estimated ejection fraction was in the range of 55% to 60%. Features are consistent with a pseudonormal left ventricular filling pattern, with concomitant abnormal relaxation and increased filling pressure (grade 2 diastolic dysfunction). - Mitral valve: There was mild regurgitation. - Left atrium: The atrium was mildly dilated.  Impressions:  - Poor acoustic windows limit study  PROCEDURES PERFORMED:  Left Heart Catheterization with Native Coronary And Graft Angiography via Right Common Femoral Artery Access  Left Ventriculography  Coronary Anatomy:  Dominance: Likely right  Left Main: Moderate caliber vessel that essentially continues as the circumflex. The LAD is 100% occluded at the left main. LIMA-LAD:Widely patent graft to the mid LAD. The distal LAD beyond the graft is a very small caliber vessel that reaches down to the apex in tapering fashion. There is also retrograde flow proximally to the occlusion site this provides flow to a significant diagonal branch. No significant disease in the LAD or diagonal but small in caliber. There are faint collaterals from septal perforators in the distal LAD to the RPDA.  Left Circumflex:Moderate caliber vessel that gives rise to 3 OM branches. The first and third branches have severe disease and appeared to be the grafted vessels. OM 3 is actually occluded. The following 1 circumflex beyond OM 3 provides mild collaterals to the posterior lateral system.  SVG-OM1-OM 3:Widely patent graft to moderate caliber OM branches. The first OM has minimal retrograde flow wears second has low back to the main circumflex and provides flow to the distal smaller circumflex. Both downstream grafted vessels are relatively free of disease.   RCA:  Subtotal occlusion the very proximal segment. After a roughly 20 no longer segment there is recanalization until just beyond the first artery marginal branch were again there is a subtotal occlusion. The vessel then again normalizes with potentially the bridging collaterals to be relatively normal in the mid segment until the crux. At about that point the vessels is totally occluded. The RPDA and posterior lateral system are not visualized from antegrade flow. SVG-RVM-PDA:100% occluded at the ostium. There is some mild calcification noted in the occluded graft.  POST-OPERATIVE DIAGNOSIS:   Severe native coronary artery disease with occluded LAD and distal RCA with subtotal occlusion more proximally in the RCA. Also severe disease in the circumflex system. Aided vessels are very small caliber.  Occluded SVG-RPL-PDA, leaving no flow to the distal RCA system with exception of collaterals from the LAD.  Widely patent LIMA-LAD to a very small caliber distal LAD with retrograde filling to a diagonal branch. Also widely patent sequential vein graft to 2 OM branches with minimal disease in the downstream vessels.  Severe systemic hypertension with severely elevated LVEDP of 30-34 mmHg.  No obvious culprit for positive troponins and this would suggest likely demand in the setting of hypertensive urgency / acute on chronic diastolic heart failure  HARDING, Piedad ClimesAVID W, M.D., M.S. Interventional Cardiologist    Assessment/Plan:  1. CAD - prior MI with remote CABG with NSTEMI from 04/2014, likely demand ischemia from heart failure exacerbation - his cath showed severe multivessel disease of native arteries and grafts but no culprit lesion - he is managed medically.  He is having no symptoms. Actively working on his weight. He would be high risk for hip surgery. He understands this.   2. Chronic diastolic HF - compensated. No change with current regimen.   3. Essential hypertension - BP looks good.  I have asked him to monitor at home as well. With continued weight  loss - may need reduction in medicines but not at this time.   4. Chronic kidney disease stage IV - seeing Nephrology regularly.   5. Dyslipidemia - on statin - labs checked by PCP  6. Hx of stroke - Continue aggrenox  7. Bilateral carotid disease- needs repeat duplex study back in April of 2018 -will go ahead and arrange.   Current medicines are reviewed with the patient today.  The patient does not have concerns regarding medicines other than what has been noted above.  The following changes have been made:  See above.  Labs/ tests ordered today include:    Orders Placed This Encounter  Procedures  . EKG 12-Lead     Disposition:   FU with me in 6 months - will see back sooner if he does need to proceed with surgery. Overall, he is currently doing well from our standpoint.    Patient is agreeable to this plan and will call if any problems develop in the interim.   SignedNorma Fredrickson, NP  04/16/2016 8:54 AM  [facility] [address] [place], [place]  [address] Phone: [phone] Fax: [phone]

## 2016-05-17 ENCOUNTER — Other Ambulatory Visit: Payer: Self-pay | Admitting: Nurse Practitioner

## 2016-05-17 ENCOUNTER — Ambulatory Visit (HOSPITAL_COMMUNITY)
Admission: RE | Admit: 2016-05-17 | Discharge: 2016-05-17 | Disposition: A | Discharge status: Home / Self Care | Payer: Medicare Other | Source: Ambulatory Visit | Attending: Cardiovascular Disease | Admitting: Cardiovascular Disease

## 2016-05-17 DIAGNOSIS — I5042 Chronic combined systolic (congestive) and diastolic (congestive) heart failure: Secondary | ICD-10-CM | POA: Diagnosis not present

## 2016-05-17 DIAGNOSIS — I2510 Atherosclerotic heart disease of native coronary artery without angina pectoris: Secondary | ICD-10-CM | POA: Insufficient documentation

## 2016-05-17 DIAGNOSIS — I6523 Occlusion and stenosis of bilateral carotid arteries: Secondary | ICD-10-CM | POA: Diagnosis not present

## 2016-05-24 ENCOUNTER — Telehealth: Payer: Self-pay | Admitting: Nurse Practitioner

## 2016-05-24 NOTE — Telephone Encounter (Signed)
New message      Returning a call to the nurse to get test results.  Pt will be in and out today.  Ok to leave results on voicemail.

## 2016-05-24 NOTE — Telephone Encounter (Signed)
Informed pt of carotid results. Pt verbalized understanding.  

## 2016-06-19 ENCOUNTER — Other Ambulatory Visit: Payer: Self-pay | Admitting: Orthopedic Surgery

## 2016-06-20 ENCOUNTER — Other Ambulatory Visit: Payer: Self-pay | Admitting: Orthopedic Surgery

## 2016-06-25 ENCOUNTER — Telehealth: Payer: Self-pay | Admitting: *Deleted

## 2016-06-25 NOTE — Telephone Encounter (Signed)
S/w pt made appointment for this week.  Lawson FiscalLori wants pt to come in to go over risk factor for upcoming hip surgery.

## 2016-06-26 ENCOUNTER — Ambulatory Visit (HOSPITAL_COMMUNITY)
Admission: RE | Admit: 2016-06-26 | Discharge: 2016-06-26 | Disposition: A | Discharge status: Home / Self Care | Payer: Medicare Other | Source: Ambulatory Visit | Attending: Orthopedic Surgery | Admitting: Orthopedic Surgery

## 2016-06-26 ENCOUNTER — Encounter (HOSPITAL_COMMUNITY): Payer: Self-pay

## 2016-06-26 ENCOUNTER — Encounter (HOSPITAL_COMMUNITY)
Admission: RE | Admit: 2016-06-26 | Discharge: 2016-06-26 | Disposition: A | Discharge status: Home / Self Care | Payer: Medicare Other | Source: Ambulatory Visit | Attending: Orthopedic Surgery | Admitting: Orthopedic Surgery

## 2016-06-26 DIAGNOSIS — Z01810 Encounter for preprocedural cardiovascular examination: Secondary | ICD-10-CM | POA: Insufficient documentation

## 2016-06-26 DIAGNOSIS — N189 Chronic kidney disease, unspecified: Secondary | ICD-10-CM | POA: Diagnosis not present

## 2016-06-26 DIAGNOSIS — E1122 Type 2 diabetes mellitus with diabetic chronic kidney disease: Secondary | ICD-10-CM | POA: Diagnosis not present

## 2016-06-26 DIAGNOSIS — I130 Hypertensive heart and chronic kidney disease with heart failure and stage 1 through stage 4 chronic kidney disease, or unspecified chronic kidney disease: Secondary | ICD-10-CM | POA: Diagnosis not present

## 2016-06-26 DIAGNOSIS — E669 Obesity, unspecified: Secondary | ICD-10-CM | POA: Insufficient documentation

## 2016-06-26 DIAGNOSIS — M7989 Other specified soft tissue disorders: Secondary | ICD-10-CM | POA: Diagnosis not present

## 2016-06-26 DIAGNOSIS — Z01818 Encounter for other preprocedural examination: Secondary | ICD-10-CM | POA: Insufficient documentation

## 2016-06-26 DIAGNOSIS — Z794 Long term (current) use of insulin: Secondary | ICD-10-CM | POA: Diagnosis not present

## 2016-06-26 DIAGNOSIS — M1612 Unilateral primary osteoarthritis, left hip: Secondary | ICD-10-CM | POA: Insufficient documentation

## 2016-06-26 DIAGNOSIS — Z01812 Encounter for preprocedural laboratory examination: Secondary | ICD-10-CM | POA: Diagnosis present

## 2016-06-26 DIAGNOSIS — Z951 Presence of aortocoronary bypass graft: Secondary | ICD-10-CM | POA: Diagnosis not present

## 2016-06-26 DIAGNOSIS — I509 Heart failure, unspecified: Secondary | ICD-10-CM | POA: Diagnosis not present

## 2016-06-26 HISTORY — DX: Unspecified osteoarthritis, unspecified site: M19.90

## 2016-06-26 HISTORY — DX: Heart failure, unspecified: I50.9

## 2016-06-26 LAB — CBC WITH DIFFERENTIAL/PLATELET
Basophils Absolute: 0.1 10*3/uL (ref 0.0–0.1)
Basophils Relative: 1 %
Eosinophils Absolute: 0.7 10*3/uL (ref 0.0–0.7)
Eosinophils Relative: 5 %
HEMATOCRIT: 41.2 % (ref 39.0–52.0)
Hemoglobin: 13.3 g/dL (ref 13.0–17.0)
LYMPHS ABS: 1.3 10*3/uL (ref 0.7–4.0)
LYMPHS PCT: 9 %
MCH: 29.6 pg (ref 26.0–34.0)
MCHC: 32.3 g/dL (ref 30.0–36.0)
MCV: 91.8 fL (ref 78.0–100.0)
MONOS PCT: 10 %
Monocytes Absolute: 1.4 10*3/uL — ABNORMAL HIGH (ref 0.1–1.0)
NEUTROS ABS: 10.9 10*3/uL — AB (ref 1.7–7.7)
NEUTROS PCT: 75 %
Platelets: 232 10*3/uL (ref 150–400)
RBC: 4.49 MIL/uL (ref 4.22–5.81)
RDW: 14.9 % (ref 11.5–15.5)
WBC: 14.4 10*3/uL — ABNORMAL HIGH (ref 4.0–10.5)

## 2016-06-26 LAB — URINALYSIS, ROUTINE W REFLEX MICROSCOPIC
BILIRUBIN URINE: NEGATIVE
Glucose, UA: NEGATIVE mg/dL
Hgb urine dipstick: NEGATIVE
Ketones, ur: NEGATIVE mg/dL
LEUKOCYTES UA: NEGATIVE
NITRITE: NEGATIVE
PROTEIN: NEGATIVE mg/dL
Specific Gravity, Urine: 1.008 (ref 1.005–1.030)
pH: 5 (ref 5.0–8.0)

## 2016-06-26 LAB — BASIC METABOLIC PANEL
Anion gap: 9 (ref 5–15)
BUN: 41 mg/dL — ABNORMAL HIGH (ref 6–20)
CHLORIDE: 105 mmol/L (ref 101–111)
CO2: 28 mmol/L (ref 22–32)
CREATININE: 2.25 mg/dL — AB (ref 0.61–1.24)
Calcium: 9.4 mg/dL (ref 8.9–10.3)
GFR calc non Af Amer: 29 mL/min — ABNORMAL LOW (ref >60)
GFR, EST AFRICAN AMERICAN: 33 mL/min — AB (ref >60)
Glucose, Bld: 87 mg/dL (ref 65–99)
POTASSIUM: 4.2 mmol/L (ref 3.5–5.1)
Sodium: 142 mmol/L (ref 135–145)

## 2016-06-26 LAB — PROTIME-INR
INR: 1
Prothrombin Time: 13.2 seconds (ref 11.4–15.2)

## 2016-06-26 LAB — GLUCOSE, CAPILLARY: Glucose-Capillary: 107 mg/dL — ABNORMAL HIGH (ref 65–99)

## 2016-06-26 LAB — ABO/RH: ABO/RH(D): O POS

## 2016-06-26 LAB — SURGICAL PCR SCREEN
MRSA, PCR: NEGATIVE
STAPHYLOCOCCUS AUREUS: POSITIVE — AB

## 2016-06-26 LAB — TYPE AND SCREEN
ABO/RH(D): O POS
ANTIBODY SCREEN: NEGATIVE

## 2016-06-26 LAB — APTT: aPTT: 33 seconds (ref 24–36)

## 2016-06-26 NOTE — Pre-Procedure Instructions (Addendum)
Justin Bartlett  06/26/2016      [facility] - [name], [place] - [address] AT [facility] [address] [place] [address] Phone: [phone] Fax: [phone]    Your procedure is scheduled on May 30  Report to [facility] Admitting at 1045 A.M.  Call this number if you have problems the morning of surgery:  [phone]   Remember:  Do not eat food or drink liquids after midnight.   Take these medicines the morning of surgery with A SIP OF WATER hydrALAZINE (APRESOLINE), isosorbide mononitrate (IMDUR), metoprolol (LOPRESSOR)  7 days prior to surgery(06/27/16) STOP taking any Aspirin, Aleve, Naproxen, Ibuprofen, Motrin, Advil, Goody's, BC's, all herbal medications, fish oil, and all vitamins  Stop aggrenox per dr      How to Manage Your Diabetes Before and After Surgery  Why is it important to control my blood sugar before and after surgery? . Improving blood sugar levels before and after surgery helps healing and can limit problems. . A way of improving blood sugar control is eating a healthy diet by: o  Eating less sugar and carbohydrates o  Increasing activity/exercise o  Talking with your doctor about reaching your blood sugar goals . High blood sugars (greater than 180 mg/dL) can raise your risk of infections and slow your recovery, so you will need to focus on controlling your diabetes during the weeks before surgery. . Make sure that the doctor who takes care of your diabetes knows about your planned surgery including the date and location.  How do I manage my blood sugar before surgery? . Check your blood sugar at least 4 times a day, starting 2 days before surgery, to make sure that the level is not too high or low. o Check your blood sugar the morning of your surgery when you wake up and every 2 hours until you get to the Short Stay unit. . If your blood sugar is less than 70 mg/dL, you will  need to treat for low blood sugar: o Do not take insulin. o Treat a low blood sugar (less than 70 mg/dL) with  cup of clear juice (cranberry or apple), 4 glucose tablets, OR glucose gel. o Recheck blood sugar in 15 minutes after treatment (to make sure it is greater than 70 mg/dL). If your blood sugar is not greater than 70 mg/dL on recheck, call [phone] for further instructions. . Report your blood sugar to the short stay nurse when you get to Short Stay.  . If you are admitted to the hospital after surgery: o Your blood sugar will be checked by the staff and you will probably be given insulin after surgery (instead of oral diabetes medicines) to make sure you have good blood sugar levels. o The goal for blood sugar control after surgery is 80-180 mg/dL. o   WHAT DO I DO ABOUT MY DIABETES MEDICATION  . Do not take oral diabetes medicines (pills) the morning of surgery.  . THE NIGHT BEFORE SURGERY,do not take any humulin at bedtime Take usual meal doses     THE MORNING OF SURGERY, Do not take insulin except as instructed below   . If your CBG is greater than 220 mg/dL, you may take  of your sliding scale (correction) dose of insulin. .     Do not wear jewelry  Do not wear lotions, powders, or cologne, or deoderant.  Men  may shave face and neck.  Do not bring valuables to the hospital.  [facility] is not responsible for any belongings or valuables.  Contacts, dentures or bridgework may not be worn into surgery.  Leave your suitcase in the car.  After surgery it may be brought to your room.  For patients admitted to the hospital, discharge time will be determined by your treatment team.  Patients discharged the day of surgery will not be allowed to drive home.    Special instructions:   [facility]- Preparing For Surgery  Before surgery, you can play an important role. Because skin is not sterile, your skin needs to be as free of germs as possible. You can reduce the  number of germs on your skin by washing with CHG (chlorahexidine gluconate) Soap before surgery.  CHG is an antiseptic cleaner which kills germs and bonds with the skin to continue killing germs even after washing.  Please do not use if you have an allergy to CHG or antibacterial soaps. If your skin becomes reddened/irritated stop using the CHG.  Do not shave (including legs and underarms) for at least 48 hours prior to first CHG shower. It is OK to shave your face.  Please follow these instructions carefully.   1. Shower the NIGHT BEFORE SURGERY and the MORNING OF SURGERY with CHG.   2. If you chose to wash your hair, wash your hair first as usual with your normal shampoo.  3. After you shampoo, rinse your hair and body thoroughly to remove the shampoo.  4. Use CHG as you would any other liquid soap. You can apply CHG directly to the skin and wash gently with a scrungie or a clean washcloth.   5. Apply the CHG Soap to your body ONLY FROM THE NECK DOWN.  Do not use on open wounds or open sores. Avoid contact with your eyes, ears, mouth and genitals (private parts). Wash genitals (private parts) with your normal soap.  6. Wash thoroughly, paying special attention to the area where your surgery will be performed.  7. Thoroughly rinse your body with warm water from the neck down.  8. DO NOT shower/wash with your normal soap after using and rinsing off the CHG Soap.  9. Pat yourself dry with a CLEAN TOWEL.   10. Wear CLEAN PAJAMAS   11. Place CLEAN SHEETS on your bed the night of your first shower and DO NOT SLEEP WITH PETS.    Day of Surgery: Do not apply any deodorants/lotions. Please wear clean clothes to the hospital/surgery center.      Please read over the fact sheets that you were given.

## 2016-06-26 NOTE — Progress Notes (Signed)
   06/26/16 0836  OBSTRUCTIVE SLEEP APNEA  Have you ever been diagnosed with sleep apnea through a sleep study? No  Do you snore loudly (loud enough to be heard through closed doors)?  0  Do you often feel tired, fatigued, or sleepy during the daytime (such as falling asleep during driving or talking to someone)? 0  Has anyone observed you stop breathing during your sleep? 0  Do you have, or are you being treated for high blood pressure? 1  BMI more than 35 kg/m2? 1  Age > 50 (1-yes) 1  Neck circumference greater than:Male 16 inches or larger, Male 17inches or larger? 1 67(18)  Male Gender (Yes=1) 1  Obstructive Sleep Apnea Score 5  Score 5 or greater  Results sent to PCP

## 2016-06-27 ENCOUNTER — Ambulatory Visit (INDEPENDENT_AMBULATORY_CARE_PROVIDER_SITE_OTHER): Payer: Medicare Other | Admitting: Nurse Practitioner

## 2016-06-27 ENCOUNTER — Telehealth: Payer: Self-pay | Admitting: *Deleted

## 2016-06-27 ENCOUNTER — Encounter: Payer: Self-pay | Admitting: Nurse Practitioner

## 2016-06-27 VITALS — BP 170/80 | HR 66 | Ht 71.0 in | Wt 261.8 lb

## 2016-06-27 DIAGNOSIS — I2510 Atherosclerotic heart disease of native coronary artery without angina pectoris: Secondary | ICD-10-CM

## 2016-06-27 DIAGNOSIS — I259 Chronic ischemic heart disease, unspecified: Secondary | ICD-10-CM | POA: Diagnosis not present

## 2016-06-27 DIAGNOSIS — I5042 Chronic combined systolic (congestive) and diastolic (congestive) heart failure: Secondary | ICD-10-CM | POA: Diagnosis not present

## 2016-06-27 DIAGNOSIS — Z01810 Encounter for preprocedural cardiovascular examination: Secondary | ICD-10-CM | POA: Diagnosis not present

## 2016-06-27 LAB — HEMOGLOBIN A1C
HEMOGLOBIN A1C: 6.6 % — AB (ref 4.8–5.6)
Mean Plasma Glucose: 143 mg/dL

## 2016-06-27 NOTE — Progress Notes (Signed)
Anesthesia Chart Review:  Pt is a 66 year old male scheduled for L total hip arthroplasty anterior approach on 07/04/2016 with Gean BirchwoodFrank Rowan, M.D.  - PCP is Blair Heysobert Ehinger, MD - Cardiology care by Norma FredricksonLori Gerhardt, NP, who cleared pt at increased risk at last office visit 06/27/16 (note indicates pt's quality of life with hip pain is poor and pt accepts increased risk of surgery).  - Nephrologist is Barnabas ListerSameea Sadiq, MD (notes in care everywhere)  PMH includes: CAD (S/P CABG 5 2001), CHF, HTN, DM, hyperlipidemia, CKD (stage 3), stroke (2006), peripheral edema. Never smoker. BMI 36.5. s/p cholecystectomy 01/21/12.   Medications include: Lipitor, Aggrenox, Humulin R, hydralazine, Imdur, metoprolol. Last dose Aggrenox 06/30/16.   Preoperative labs reviewed.   - HbA1c 6.6, glucose 87.  - Cr 2.25, BUN 41. Baseline Cr ~2.0 per nephrology notes in care everywhere  CXR 06/26/16: No active cardiopulmonary disease.  EKG 06/27/16: NSR. ST and T wave changes with lateral ischemia (chronic).   Carotid duplex 05/17/16:  - Heterogeneous plaque, bilaterally. - Stable 40-59% RICA stenosis. - Essentially stable 60-79% LICA stenosis. - Normal right subclavian artery; elevated velocities on the left. - Patent vertebral arteries with antegrade flow  Echo 04/30/14:  - Left ventricle: The cavity size was normal. Wall thickness wasincreased in a pattern of mild LVH. Systolic function was normal.The estimated ejection fraction was in the range of 55% to 60%.Features are consistent with a pseudonormal left ventricularfilling pattern, with concomitant abnormal relaxation and increased filling pressure (grade 2 diastolic dysfunction). - Mitral valve: There was mild regurgitation. - Left atrium: The atrium was mildly dilated. - Impressions: Poor acoustic windows limit study  Cardiac cath 04/28/16:   Dominance: Likely right  Left Main: Moderate caliber vessel that essentially continues as the circumflex. The LAD is 100%  occluded at the left main. LIMA-LAD: Widely patent graft to the mid LAD. The distal LAD beyond the graft is a very small caliber vessel that reaches down to the apex in tapering fashion. There is also retrograde flow proximally to the occlusion site this provides flow to a significant diagonal branch. No significant disease in the LAD or diagonal but small in caliber.  There are faint collaterals from septal perforators in the distal LAD to the RPDA.  Left Circumflex: Moderate caliber vessel that gives rise to 3 OM branches. The first and third branches have severe disease and appeared to be the grafted vessels. OM 3 is actually occluded. The following 1 circumflex beyond OM 3 provides mild collaterals to the posterior lateral system.  SVG-OM1-OM 3: Widely patent graft to moderate caliber OM branches. The first OM has minimal retrograde flow wears second has low back to the main circumflex and provides flow to the distal smaller circumflex. Both downstream grafted vessels are relatively free of disease.   RCA: Subtotal occlusion the very proximal segment. After a roughly 20 no longer segment there is recanalization until just beyond the first artery marginal branch were again there is a subtotal occlusion. The vessel then again normalizes with potentially the bridging collaterals to be relatively normal in the mid segment until the crux. At about that point the vessels is totally occluded.  The RPDA and posterior lateral system are not visualized from antegrade flow. SVG-RVM-PDA: 100% occluded at the ostium. There is some mild calcification noted in the occluded graft. POST-OPERATIVE DIAGNOSIS:    Severe native coronary artery disease with occluded LAD and distal RCA with subtotal occlusion more proximally in the RCA. Also severe disease  in the circumflex system.  Aided vessels are very small caliber.  Occluded SVG-RPL-PDA, leaving no flow to the distal RCA system with exception of collaterals from  the LAD.  Widely patent LIMA-LAD to a very small caliber distal LAD with retrograde filling to a diagonal branch. Also widely patent sequential vein graft to 2 OM branches with minimal disease in the downstream vessels.  Severe systemic hypertension with severely elevated LVEDP of 30-34 mmHg.  No obvious culprit for positive troponins and this would suggest likely demand in the setting of hypertensive urgency / acute on chronic diastolic heart failure  If no changes, I anticipate pt can proceed with surgery as scheduled.   Rica Mast, FNP-BC [facility]/Anesthesiology Phone: [phone] 06/27/2016 12:55 PM

## 2016-06-27 NOTE — Patient Instructions (Addendum)
We will be checking the following labs today - NONE   Medication Instructions:    Continue with your current medicines.     Testing/Procedures To Be Arranged:  N/A  Follow-Up:   See me back as planned.     Other Special Instructions:   I will send a note to Dr. Turner Danielsowan    If you need a refill on your cardiac medications before your next appointment, please call your pharmacy.   Call the [facility] office at [phone] if you have any questions, problems or concerns.
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
## 2016-06-27 NOTE — Telephone Encounter (Signed)
Faxing surgical clearance paperwork to [facility] @ [phone]. Agustin CreeKathy Blume Surgical Coordinator @ [phone].

## 2016-06-28 ENCOUNTER — Other Ambulatory Visit: Payer: Self-pay | Admitting: Nurse Practitioner

## 2016-07-03 DIAGNOSIS — M1612 Unilateral primary osteoarthritis, left hip: Secondary | ICD-10-CM | POA: Diagnosis present

## 2016-07-03 MED ORDER — DEXTROSE-NACL 5-0.45 % IV SOLN: INTRAVENOUS | Status: DC

## 2016-07-03 MED ORDER — SODIUM CHLORIDE 0.9 % IV SOLN
2000.0000 mg | INTRAVENOUS | Status: AC
  Administered 2016-07-04: 2000 mg via TOPICAL
  Filled 2016-07-03: qty 20

## 2016-07-03 MED ORDER — DEXTROSE 5 % IV SOLN
3.0000 g | INTRAVENOUS | Status: AC
  Administered 2016-07-04: 3 g via INTRAVENOUS
  Filled 2016-07-03 (×2): qty 3000
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
## 2016-07-04 ENCOUNTER — Inpatient Hospital Stay (HOSPITAL_COMMUNITY): Payer: Medicare Other | Admitting: Anesthesiology

## 2016-07-04 ENCOUNTER — Inpatient Hospital Stay (HOSPITAL_COMMUNITY): Payer: Medicare Other | Admitting: Emergency Medicine

## 2016-07-04 ENCOUNTER — Encounter (HOSPITAL_COMMUNITY)
Admission: RE | Disposition: A | Discharge status: Home Health | Payer: Self-pay | Source: Ambulatory Visit | Attending: Orthopedic Surgery

## 2016-07-04 ENCOUNTER — Inpatient Hospital Stay (HOSPITAL_COMMUNITY)
Admission: RE | Admit: 2016-07-04 | Discharge: 2016-07-06 | DRG: 470 | Disposition: A | Discharge status: Home Health | Payer: Medicare Other | Source: Ambulatory Visit | Attending: Orthopedic Surgery | Admitting: Orthopedic Surgery

## 2016-07-04 ENCOUNTER — Encounter (HOSPITAL_COMMUNITY): Payer: Self-pay

## 2016-07-04 ENCOUNTER — Inpatient Hospital Stay (HOSPITAL_COMMUNITY): Payer: Medicare Other

## 2016-07-04 DIAGNOSIS — E7800 Pure hypercholesterolemia, unspecified: Secondary | ICD-10-CM | POA: Diagnosis present

## 2016-07-04 DIAGNOSIS — N184 Chronic kidney disease, stage 4 (severe): Secondary | ICD-10-CM | POA: Diagnosis present

## 2016-07-04 DIAGNOSIS — I252 Old myocardial infarction: Secondary | ICD-10-CM

## 2016-07-04 DIAGNOSIS — M1612 Unilateral primary osteoarthritis, left hip: Secondary | ICD-10-CM | POA: Diagnosis present

## 2016-07-04 DIAGNOSIS — Z888 Allergy status to other drugs, medicaments and biological substances status: Secondary | ICD-10-CM | POA: Diagnosis not present

## 2016-07-04 DIAGNOSIS — Z6836 Body mass index (BMI) 36.0-36.9, adult: Secondary | ICD-10-CM | POA: Diagnosis not present

## 2016-07-04 DIAGNOSIS — Z9689 Presence of other specified functional implants: Secondary | ICD-10-CM | POA: Diagnosis present

## 2016-07-04 DIAGNOSIS — I5042 Chronic combined systolic (congestive) and diastolic (congestive) heart failure: Secondary | ICD-10-CM | POA: Diagnosis present

## 2016-07-04 DIAGNOSIS — Z79899 Other long term (current) drug therapy: Secondary | ICD-10-CM

## 2016-07-04 DIAGNOSIS — I255 Ischemic cardiomyopathy: Secondary | ICD-10-CM | POA: Diagnosis present

## 2016-07-04 DIAGNOSIS — D62 Acute posthemorrhagic anemia: Secondary | ICD-10-CM | POA: Diagnosis present

## 2016-07-04 DIAGNOSIS — Z419 Encounter for procedure for purposes other than remedying health state, unspecified: Secondary | ICD-10-CM

## 2016-07-04 DIAGNOSIS — E1122 Type 2 diabetes mellitus with diabetic chronic kidney disease: Secondary | ICD-10-CM | POA: Diagnosis present

## 2016-07-04 DIAGNOSIS — Z86711 Personal history of pulmonary embolism: Secondary | ICD-10-CM | POA: Diagnosis not present

## 2016-07-04 DIAGNOSIS — I2510 Atherosclerotic heart disease of native coronary artery without angina pectoris: Secondary | ICD-10-CM | POA: Diagnosis present

## 2016-07-04 DIAGNOSIS — Z7901 Long term (current) use of anticoagulants: Secondary | ICD-10-CM

## 2016-07-04 DIAGNOSIS — Z794 Long term (current) use of insulin: Secondary | ICD-10-CM | POA: Diagnosis not present

## 2016-07-04 DIAGNOSIS — Z951 Presence of aortocoronary bypass graft: Secondary | ICD-10-CM

## 2016-07-04 DIAGNOSIS — Z8673 Personal history of transient ischemic attack (TIA), and cerebral infarction without residual deficits: Secondary | ICD-10-CM | POA: Diagnosis not present

## 2016-07-04 DIAGNOSIS — I130 Hypertensive heart and chronic kidney disease with heart failure and stage 1 through stage 4 chronic kidney disease, or unspecified chronic kidney disease: Secondary | ICD-10-CM | POA: Diagnosis present

## 2016-07-04 HISTORY — PX: TOTAL HIP ARTHROPLASTY: SHX124

## 2016-07-04 LAB — GLUCOSE, CAPILLARY
GLUCOSE-CAPILLARY: 149 mg/dL — AB (ref 65–99)
Glucose-Capillary: 130 mg/dL — ABNORMAL HIGH (ref 65–99)
Glucose-Capillary: 256 mg/dL — ABNORMAL HIGH (ref 65–99)

## 2016-07-04 SURGERY — ARTHROPLASTY, HIP, TOTAL, ANTERIOR APPROACH
Anesthesia: Spinal | Site: Hip | Laterality: Left

## 2016-07-04 MED ORDER — HYDROMORPHONE HCL 1 MG/ML IJ SOLN
0.2500 mg | INTRAMUSCULAR | Status: DC | PRN
Start: 2016-07-04 — End: 2016-07-04

## 2016-07-04 MED ORDER — ALUMINUM HYDROXIDE GEL 320 MG/5ML PO SUSP
15.0000 mL | ORAL | Status: DC | PRN
  Filled 2016-07-04: qty 30

## 2016-07-04 MED ORDER — MIDAZOLAM HCL 2 MG/2ML IJ SOLN
INTRAMUSCULAR | Status: AC
  Filled 2016-07-04: qty 2

## 2016-07-04 MED ORDER — EPINEPHRINE PF 1 MG/ML IJ SOLN
INTRAMUSCULAR | Status: AC
  Filled 2016-07-04: qty 1

## 2016-07-04 MED ORDER — ACETAMINOPHEN 650 MG RE SUPP: 650.0000 mg | Freq: Four times a day (QID) | RECTAL | Status: DC | PRN

## 2016-07-04 MED ORDER — PROPOFOL 500 MG/50ML IV EMUL
INTRAVENOUS | Status: DC | PRN
  Administered 2016-07-04: 50 ug/kg/min via INTRAVENOUS

## 2016-07-04 MED ORDER — ACETAMINOPHEN 325 MG PO TABS: 650.0000 mg | ORAL_TABLET | Freq: Four times a day (QID) | ORAL | Status: DC | PRN

## 2016-07-04 MED ORDER — MENTHOL 3 MG MT LOZG: 1.0000 | LOZENGE | OROMUCOSAL | Status: DC | PRN

## 2016-07-04 MED ORDER — TRANEXAMIC ACID 1000 MG/10ML IV SOLN: 1000.0000 mg | INTRAVENOUS | Status: DC

## 2016-07-04 MED ORDER — TIZANIDINE HCL 2 MG PO TABS: 2.0000 mg | ORAL_TABLET | Freq: Four times a day (QID) | ORAL | 0 refills | Status: AC | PRN

## 2016-07-04 MED ORDER — METHOCARBAMOL 1000 MG/10ML IJ SOLN
500.0000 mg | Freq: Four times a day (QID) | INTRAMUSCULAR | Status: DC | PRN
  Filled 2016-07-04: qty 5

## 2016-07-04 MED ORDER — METOPROLOL TARTRATE 100 MG PO TABS
100.0000 mg | ORAL_TABLET | Freq: Two times a day (BID) | ORAL | Status: DC
  Administered 2016-07-05 – 2016-07-06 (×3): 100 mg via ORAL
  Filled 2016-07-04 (×3): qty 1

## 2016-07-04 MED ORDER — INSULIN REGULAR HUMAN (CONC) 500 UNIT/ML ~~LOC~~ SOPN
80.0000 [IU] | PEN_INJECTOR | Freq: Two times a day (BID) | SUBCUTANEOUS | Status: DC
  Administered 2016-07-04: 70 [IU] via SUBCUTANEOUS
  Administered 2016-07-05 – 2016-07-06 (×2): 80 [IU] via SUBCUTANEOUS
  Filled 2016-07-04: qty 3

## 2016-07-04 MED ORDER — BISACODYL 5 MG PO TBEC: 5.0000 mg | DELAYED_RELEASE_TABLET | Freq: Every day | ORAL | Status: DC | PRN

## 2016-07-04 MED ORDER — PHENYLEPHRINE HCL 10 MG/ML IJ SOLN
INTRAVENOUS | Status: DC | PRN
  Administered 2016-07-04: 25 ug/min via INTRAVENOUS

## 2016-07-04 MED ORDER — ISOSORBIDE MONONITRATE ER 60 MG PO TB24
120.0000 mg | ORAL_TABLET | Freq: Every day | ORAL | Status: DC
  Administered 2016-07-05 – 2016-07-06 (×2): 120 mg via ORAL
  Filled 2016-07-04 (×2): qty 2

## 2016-07-04 MED ORDER — BUPIVACAINE HCL (PF) 0.5 % IJ SOLN
INTRAMUSCULAR | Status: AC
  Filled 2016-07-04: qty 60

## 2016-07-04 MED ORDER — MIDAZOLAM HCL 5 MG/5ML IJ SOLN
INTRAMUSCULAR | Status: DC | PRN
  Administered 2016-07-04: 2 mg via INTRAVENOUS

## 2016-07-04 MED ORDER — SODIUM CHLORIDE 0.9 % IV SOLN
INTRAVENOUS | Status: DC
  Administered 2016-07-04: 11:00:00 via INTRAVENOUS

## 2016-07-04 MED ORDER — HYDROMORPHONE HCL 1 MG/ML IJ SOLN
0.5000 mg | INTRAMUSCULAR | Status: DC | PRN
  Administered 2016-07-04: 0.5 mg via INTRAVENOUS
  Filled 2016-07-04: qty 1

## 2016-07-04 MED ORDER — METHOCARBAMOL 500 MG PO TABS
500.0000 mg | ORAL_TABLET | Freq: Four times a day (QID) | ORAL | Status: DC | PRN
  Administered 2016-07-04 – 2016-07-06 (×4): 500 mg via ORAL
  Filled 2016-07-04 (×4): qty 1

## 2016-07-04 MED ORDER — KCL IN DEXTROSE-NACL 20-5-0.45 MEQ/L-%-% IV SOLN
INTRAVENOUS | Status: DC
  Administered 2016-07-04: 23:00:00 via INTRAVENOUS
  Filled 2016-07-04: qty 1000

## 2016-07-04 MED ORDER — 0.9 % SODIUM CHLORIDE (POUR BTL) OPTIME
TOPICAL | Status: DC | PRN
  Administered 2016-07-04: 1000 mL

## 2016-07-04 MED ORDER — ONDANSETRON HCL 4 MG/2ML IJ SOLN: 4.0000 mg | Freq: Four times a day (QID) | INTRAMUSCULAR | Status: DC | PRN

## 2016-07-04 MED ORDER — BUPIVACAINE IN DEXTROSE 0.75-8.25 % IT SOLN
INTRATHECAL | Status: DC | PRN
  Administered 2016-07-04: 15 mg via INTRATHECAL

## 2016-07-04 MED ORDER — BUPIVACAINE LIPOSOME 1.3 % IJ SUSP: 20.0000 mL | Freq: Once | INTRAMUSCULAR | Status: DC

## 2016-07-04 MED ORDER — BUPIVACAINE LIPOSOME 1.3 % IJ SUSP
INTRAMUSCULAR | Status: DC | PRN
  Administered 2016-07-04: 20 mL

## 2016-07-04 MED ORDER — GABAPENTIN 300 MG PO CAPS
300.0000 mg | ORAL_CAPSULE | Freq: Three times a day (TID) | ORAL | Status: DC
  Administered 2016-07-04 – 2016-07-06 (×5): 300 mg via ORAL
  Filled 2016-07-04 (×5): qty 1

## 2016-07-04 MED ORDER — FUROSEMIDE 40 MG PO TABS: 80.0000 mg | ORAL_TABLET | Freq: Two times a day (BID) | ORAL | Status: DC

## 2016-07-04 MED ORDER — PROPOFOL 10 MG/ML IV BOLUS
INTRAVENOUS | Status: AC
  Filled 2016-07-04: qty 20

## 2016-07-04 MED ORDER — FENTANYL CITRATE (PF) 100 MCG/2ML IJ SOLN
INTRAMUSCULAR | Status: DC | PRN
  Administered 2016-07-04: 50 ug via INTRAVENOUS

## 2016-07-04 MED ORDER — DEXAMETHASONE SODIUM PHOSPHATE 10 MG/ML IJ SOLN
10.0000 mg | Freq: Once | INTRAMUSCULAR | Status: AC
  Administered 2016-07-05: 10 mg via INTRAVENOUS
  Filled 2016-07-04: qty 1

## 2016-07-04 MED ORDER — HYDRALAZINE HCL 50 MG PO TABS
100.0000 mg | ORAL_TABLET | Freq: Three times a day (TID) | ORAL | Status: DC
  Administered 2016-07-04 – 2016-07-06 (×5): 100 mg via ORAL
  Filled 2016-07-04 (×5): qty 2

## 2016-07-04 MED ORDER — DOCUSATE SODIUM 100 MG PO CAPS
100.0000 mg | ORAL_CAPSULE | Freq: Two times a day (BID) | ORAL | Status: DC
  Administered 2016-07-04 – 2016-07-06 (×4): 100 mg via ORAL
  Filled 2016-07-04 (×4): qty 1

## 2016-07-04 MED ORDER — METOCLOPRAMIDE HCL 5 MG PO TABS: 5.0000 mg | ORAL_TABLET | Freq: Three times a day (TID) | ORAL | Status: DC | PRN

## 2016-07-04 MED ORDER — METOCLOPRAMIDE HCL 5 MG/ML IJ SOLN: 5.0000 mg | Freq: Three times a day (TID) | INTRAMUSCULAR | Status: DC | PRN

## 2016-07-04 MED ORDER — ASPIRIN EC 325 MG PO TBEC: 325.0000 mg | DELAYED_RELEASE_TABLET | Freq: Two times a day (BID) | ORAL | 0 refills | Status: DC

## 2016-07-04 MED ORDER — LACTATED RINGERS IV SOLN
INTRAVENOUS | Status: DC | PRN
  Administered 2016-07-04 (×2): via INTRAVENOUS

## 2016-07-04 MED ORDER — ASPIRIN-DIPYRIDAMOLE ER 25-200 MG PO CP12
1.0000 | ORAL_CAPSULE | Freq: Two times a day (BID) | ORAL | Status: DC
  Administered 2016-07-04 – 2016-07-06 (×4): 1 via ORAL
  Filled 2016-07-04 (×5): qty 1

## 2016-07-04 MED ORDER — BUPIVACAINE LIPOSOME 1.3 % IJ SUSP
20.0000 mL | Freq: Once | INTRAMUSCULAR | Status: DC
  Filled 2016-07-04: qty 20

## 2016-07-04 MED ORDER — PROPOFOL 1000 MG/100ML IV EMUL
INTRAVENOUS | Status: AC
  Filled 2016-07-04: qty 100

## 2016-07-04 MED ORDER — FENTANYL CITRATE (PF) 250 MCG/5ML IJ SOLN
INTRAMUSCULAR | Status: AC
  Filled 2016-07-04: qty 5

## 2016-07-04 MED ORDER — DIPHENHYDRAMINE HCL 12.5 MG/5ML PO ELIX: 12.5000 mg | ORAL_SOLUTION | ORAL | Status: DC | PRN

## 2016-07-04 MED ORDER — INSULIN ASPART 100 UNIT/ML ~~LOC~~ SOLN
0.0000 [IU] | Freq: Three times a day (TID) | SUBCUTANEOUS | Status: DC
  Administered 2016-07-05 – 2016-07-06 (×2): 3 [IU] via SUBCUTANEOUS

## 2016-07-04 MED ORDER — OXYCODONE-ACETAMINOPHEN 5-325 MG PO TABS: 1.0000 | ORAL_TABLET | ORAL | 0 refills | Status: DC | PRN

## 2016-07-04 MED ORDER — CHLORHEXIDINE GLUCONATE 4 % EX LIQD: 60.0000 mL | Freq: Once | CUTANEOUS | Status: DC

## 2016-07-04 MED ORDER — OXYCODONE HCL 5 MG PO TABS
5.0000 mg | ORAL_TABLET | ORAL | Status: DC | PRN
  Administered 2016-07-04 (×2): 5 mg via ORAL
  Administered 2016-07-05 – 2016-07-06 (×5): 10 mg via ORAL
  Filled 2016-07-04: qty 2
  Filled 2016-07-04: qty 1
  Filled 2016-07-04: qty 2
  Filled 2016-07-04: qty 1
  Filled 2016-07-04 (×3): qty 2

## 2016-07-04 MED ORDER — LIDOCAINE HCL (CARDIAC) 20 MG/ML IV SOLN
INTRAVENOUS | Status: DC | PRN
  Administered 2016-07-04: 20 mg via INTRAVENOUS

## 2016-07-04 MED ORDER — POLYETHYLENE GLYCOL 3350 17 G PO PACK: 17.0000 g | PACK | Freq: Every day | ORAL | Status: DC | PRN

## 2016-07-04 MED ORDER — PROPOFOL 10 MG/ML IV BOLUS
INTRAVENOUS | Status: DC | PRN
  Administered 2016-07-04: 20 mg via INTRAVENOUS

## 2016-07-04 MED ORDER — FLEET ENEMA 7-19 GM/118ML RE ENEM: 1.0000 | ENEMA | Freq: Once | RECTAL | Status: DC | PRN

## 2016-07-04 MED ORDER — PHENOL 1.4 % MT LIQD: 1.0000 | OROMUCOSAL | Status: DC | PRN

## 2016-07-04 MED ORDER — BUPIVACAINE-EPINEPHRINE (PF) 0.5% -1:200000 IJ SOLN
INTRAMUSCULAR | Status: DC | PRN
  Administered 2016-07-04: 30 mL via PERINEURAL
  Administered 2016-07-04: 20 mL via PERINEURAL

## 2016-07-04 MED ORDER — FUROSEMIDE 40 MG PO TABS
80.0000 mg | ORAL_TABLET | Freq: Two times a day (BID) | ORAL | Status: DC
  Administered 2016-07-05 – 2016-07-06 (×3): 80 mg via ORAL
  Filled 2016-07-04 (×3): qty 2

## 2016-07-04 MED ORDER — ONDANSETRON HCL 4 MG PO TABS: 4.0000 mg | ORAL_TABLET | Freq: Four times a day (QID) | ORAL | Status: DC | PRN

## 2016-07-04 MED ORDER — LIDOCAINE 2% (20 MG/ML) 5 ML SYRINGE
INTRAMUSCULAR | Status: AC
  Filled 2016-07-04: qty 5

## 2016-07-04 SURGICAL SUPPLY — 44 items
BAG DECANTER FOR FLEXI CONT (MISCELLANEOUS) ×3 IMPLANT
BLADE SAW SGTL 18X1.27X75 (BLADE) ×2 IMPLANT
BLADE SAW SGTL 18X1.27X75MM (BLADE) ×1
CAPT HIP TOTAL 2 ×3 IMPLANT
COVER PERINEAL POST (MISCELLANEOUS) ×3 IMPLANT
COVER SURGICAL LIGHT HANDLE (MISCELLANEOUS) ×3 IMPLANT
DRAPE C-ARM 42X72 X-RAY (DRAPES) ×3 IMPLANT
DRAPE STERI IOBAN 125X83 (DRAPES) ×3 IMPLANT
DRAPE U-SHAPE 47X51 STRL (DRAPES) ×6 IMPLANT
DRSG AQUACEL AG ADV 3.5X10 (GAUZE/BANDAGES/DRESSINGS) ×3 IMPLANT
DURAPREP 26ML APPLICATOR (WOUND CARE) ×3 IMPLANT
ELECT BLADE 4.0 EZ CLEAN MEGAD (MISCELLANEOUS) ×3
ELECT REM PT RETURN 9FT ADLT (ELECTROSURGICAL) ×3
ELECTRODE BLDE 4.0 EZ CLN MEGD (MISCELLANEOUS) ×1 IMPLANT
ELECTRODE REM PT RTRN 9FT ADLT (ELECTROSURGICAL) ×1 IMPLANT
FACESHIELD WRAPAROUND (MASK) ×9 IMPLANT
GLOVE BIO SURGEON STRL SZ7.5 (GLOVE) ×6 IMPLANT
GLOVE BIO SURGEON STRL SZ8.5 (GLOVE) ×3 IMPLANT
GLOVE BIOGEL PI IND STRL 8 (GLOVE) ×1 IMPLANT
GLOVE BIOGEL PI IND STRL 9 (GLOVE) ×1 IMPLANT
GLOVE BIOGEL PI INDICATOR 8 (GLOVE) ×2
GLOVE BIOGEL PI INDICATOR 9 (GLOVE) ×2
GOWN STRL REUS W/ TWL LRG LVL3 (GOWN DISPOSABLE) ×3 IMPLANT
GOWN STRL REUS W/ TWL XL LVL3 (GOWN DISPOSABLE) ×2 IMPLANT
GOWN STRL REUS W/TWL LRG LVL3 (GOWN DISPOSABLE) ×9
GOWN STRL REUS W/TWL XL LVL3 (GOWN DISPOSABLE) ×4
KIT BASIN OR (CUSTOM PROCEDURE TRAY) ×3 IMPLANT
KIT ROOM TURNOVER OR (KITS) ×3 IMPLANT
MANIFOLD NEPTUNE II (INSTRUMENTS) ×3 IMPLANT
NEEDLE HYPO 22GX1.5 SAFETY (NEEDLE) ×6 IMPLANT
NS IRRIG 1000ML POUR BTL (IV SOLUTION) ×3 IMPLANT
PACK TOTAL JOINT (CUSTOM PROCEDURE TRAY) ×3 IMPLANT
PAD ARMBOARD 7.5X6 YLW CONV (MISCELLANEOUS) ×6 IMPLANT
SPONGE LAP 18X18 X RAY DECT (DISPOSABLE) ×3 IMPLANT
SUT VIC AB 1 CTX 36 (SUTURE) ×2
SUT VIC AB 1 CTX36XBRD ANBCTR (SUTURE) ×1 IMPLANT
SUT VIC AB 2-0 CT1 27 (SUTURE) ×2
SUT VIC AB 2-0 CT1 TAPERPNT 27 (SUTURE) ×1 IMPLANT
SUT VIC AB 3-0 PS2 18 (SUTURE) ×2
SUT VIC AB 3-0 PS2 18XBRD (SUTURE) ×1 IMPLANT
SYR CONTROL 10ML LL (SYRINGE) ×6 IMPLANT
TOWEL OR 17X24 6PK STRL BLUE (TOWEL DISPOSABLE) ×3 IMPLANT
TOWEL OR 17X26 10 PK STRL BLUE (TOWEL DISPOSABLE) ×3 IMPLANT
TRAY CATH 16FR W/PLASTIC CATH (SET/KITS/TRAYS/PACK) ×3 IMPLANT

## 2016-07-04 NOTE — Anesthesia Preprocedure Evaluation (Addendum)
Anesthesia Evaluation  Patient identified by MRN, date of birth, ID band Patient awake    Reviewed: Allergy & Precautions, H&P , NPO status , Patient's Chart, lab work & pertinent test results, reviewed documented beta blocker date and time   Airway Mallampati: III  TM Distance: >3 FB Neck ROM: Full    Dental no notable dental hx. (+) Edentulous Upper, Edentulous Lower, Dental Advisory Given   Pulmonary neg pulmonary ROS,    Pulmonary exam normal breath sounds clear to auscultation       Cardiovascular hypertension, Pt. on medications and Pt. on home beta blockers + CAD, + Cardiac Stents, + Peripheral Vascular Disease and +CHF   Rhythm:Regular Rate:Normal     Neuro/Psych CVA negative psych ROS   GI/Hepatic negative GI ROS, Neg liver ROS,   Endo/Other  diabetes, Insulin DependentMorbid obesity  Renal/GU Renal InsufficiencyRenal disease  negative genitourinary   Musculoskeletal  (+) Arthritis , Osteoarthritis,    Abdominal   Peds  Hematology negative hematology ROS (+)   Anesthesia Other Findings   Reproductive/Obstetrics negative OB ROS                            Anesthesia Physical Anesthesia Plan  ASA: III  Anesthesia Plan: Spinal   Post-op Pain Management:    Induction: Intravenous  Airway Management Planned: Simple Face Mask  Additional Equipment:   Intra-op Plan:   Post-operative Plan:   Informed Consent: I have reviewed the patients History and Physical, chart, labs and discussed the procedure including the risks, benefits and alternatives for the proposed anesthesia with the patient or authorized representative who has indicated his/her understanding and acceptance.   Dental advisory given  Plan Discussed with: CRNA  Anesthesia Plan Comments:         Anesthesia Quick Evaluation

## 2016-07-04 NOTE — Anesthesia Procedure Notes (Signed)
Spinal  Patient location during procedure: OR Start time: 07/04/2016 12:55 PM End time: 07/04/2016 1:02 PM Staffing Anesthesiologist: Gaynelle AduFITZGERALD, Jocelynne Duquette Performed: anesthesiologist  Preanesthetic Checklist Completed: patient identified, surgical consent, pre-op evaluation, timeout performed, IV checked, risks and benefits discussed and monitors and equipment checked Spinal Block Patient position: sitting Prep: DuraPrep Patient monitoring: cardiac monitor, continuous pulse ox and blood pressure Approach: midline Location: L3-4 Injection technique: single-shot Needle Needle type: Pencan  Needle gauge: 24 G Needle length: 9 cm Assessment Sensory level: T8 Additional Notes Functioning IV was confirmed and monitors were applied. Sterile prep and drape, including hand hygiene and sterile gloves were used. The patient was positioned and the spine was prepped. The skin was anesthetized with lidocaine.  Free flow of clear CSF was obtained prior to injecting local anesthetic into the CSF.  The spinal needle aspirated freely following injection.  The needle was carefully withdrawn.  The patient tolerated the procedure well.

## 2016-07-04 NOTE — Progress Notes (Signed)
Assumed care of patient after receiving bedside report from PACU nurse. Patient is in bed, awake, no signs of distress.

## 2016-07-04 NOTE — Interval H&P Note (Signed)
History and Physical Interval Note:  07/04/2016 12:18 PM  Justin Bartlett  has presented today for surgery, with the diagnosis of LEFT HIP OSTEOARTHRITIS  The various methods of treatment have been discussed with the patient and family. After consideration of risks, benefits and other options for treatment, the patient has consented to  Procedure(s): LEFT TOTAL HIP ARTHROPLASTY ANTERIOR APPROACH (Left) as a surgical intervention .  The patient's history has been reviewed, patient examined, no change in status, stable for surgery.  I have reviewed the patient's chart and labs.  Questions were answered to the patient's satisfaction.     Nestor LewandowskyOWAN,Leroy Trim J

## 2016-07-04 NOTE — Transfer of Care (Signed)
Immediate Anesthesia Transfer of Care Note  Patient: Justin Bartlett  Procedure(s) Performed: Procedure(s): LEFT TOTAL HIP ARTHROPLASTY ANTERIOR APPROACH (Left)  Patient Location: PACU  Anesthesia Type:Spinal  Level of Consciousness: awake  Airway & Oxygen Therapy: Patient Spontanous Breathing and Patient connected to nasal cannula oxygen  Post-op Assessment: Report given to RN and Post -op Vital signs reviewed and stable  Post vital signs: Reviewed and stable  Last Vitals:  Vitals:   07/04/16 1452 07/04/16 1500  BP:  123/71  Pulse:  66  Resp:  13  Temp: 36.2 C     Last Pain:  Vitals:   07/04/16 1111  TempSrc: Oral         Complications: No apparent anesthesia complications

## 2016-07-04 NOTE — Discharge Instructions (Signed)

## 2016-07-04 NOTE — Op Note (Signed)
OPERATIVE REPORT    DATE OF PROCEDURE:  07/04/2016       PREOPERATIVE DIAGNOSIS:  LEFT HIP OSTEOARTHRITIS                                                          POSTOPERATIVE DIAGNOSIS:  LEFT HIP OSTEOARTHRITIS                                                           PROCEDURE: Anterior L total hip arthroplasty using a 52 mm DePuy Pinnacle  Cup, [place] [employer] Hole Eliminator, 0-degree polyethylene liner, a +8.5 mm ceramic head, a 6 hi  Depuy Triloc stem   SURGEON: UJWJX,BJYNWROWAN,Danaysha Kirn J    ASSISTANT:   Eric K. [employer] PA-C  (present throughout entire procedure and necessary for timely completion of the procedure)   ANESTHESIA: Spinal BLOOD LOSS: 350 FLUID REPLACEMENT: 1500 crystalloid Antibiotic: 2gm ancef Tranexamic Acid: 1gm iv 2gm topical COMPLICATIONS: none    INDICATIONS FOR PROCEDURE: A 66 y.o. year-old With  LEFT HIP OSTEOARTHRITIS   for 5 years, x-rays show bone-on-bone arthritic changes, and osteophytes. Despite conservative measures with observation, anti-inflammatory medicine, narcotics, use of a cane, has severe unremitting pain and can ambulate only a few blocks before resting. Patient desires elective L total hip arthroplasty to decrease pain and increase function. The risks, benefits, and alternatives were discussed at length including but not limited to the risks of infection, bleeding, nerve injury, stiffness, blood clots, the need for revision surgery, cardiopulmonary complications, among others, and they were willing to proceed. Questions answered     PROCEDURE IN DETAIL: The patient was identified by armband,  [facility], taken to the operating room , appropriate anesthetic monitors  were attached and  anesthesia was induced with the patienton the gurney. The HANA boots were applied to the feet and he was then transferred to the HANA table with a peroneal post and support underneath the non-operative le,  which was locked in 5 lb traction. Theoperative lower extremity was then prepped and draped in the usual sterile fashion from just above the iliac crest to the knee. And a timeout procedure was performed. We then made a 15 cm incision along the interval at the leading edge of the tensor fascia lata of starting at 2 cm lateral to and 2 cm distal to the ASIS. Small bleeders in the skin and subcutaneous tissue identified and cauterized we dissected down to the fascia and made an incision in the fascia allowing us to elevate the fascia of the tensor muscle and exploited the interval between the rectus and the tensor fascia lata. A Hohmann retractor was then placed along the superior neck of the femur and a Cobra retractor along the inferior neck of the femur we teed the capsule starting out at the superior anterior aspect of the acetabulum going distally and made the T along the neck both leaflets of the T were tagged with #2 Ethibond suture. Cobra retractors were then placed along the inferior and superior neck allowing us to perform a standard neck cut and  removed the femoral head with a power corkscrew. We then placed a right angle Hohmann retractor along the anterior aspect of the acetabulum a spiked Cobra in the cotyloid notch and posteriorly a Muelller retractor. We then sequentially reamed up to a 51 mm basket reamer obtaining good coverage in all quadrants, verified by C-arm imaging. Under C-arm control with and hammered into place a 52 mm Pinnacle cup in 45 of abduction and 15 of anteversion. The cup seated nicely and required no supplemental screws. We then placed a central hole Eliminator and a 0 polyethylene liner. The foot was then externally rotated to 110, the HANA elevator was placed around the flare of the greater trochanter and the limb was extended and abducted delivering the proximal femur up into the wound. A medium Hohmann retractor was placed over the greater trochanter and a Mueller retractor  along the posterior femoral neck completing the exposure. We then performed releases superiorly and and inferiorly of the capsule going back to the pirformis fossa superiorly and to the lesser trochanter inferiorly. We then entered the proximal femur with the box cutting offset chisel followed by, a canal sounder, the chili pepper and broaching up to a 6 broach. This seated nicely and we reamed the calcar. A trial reduction was performed with a 1.5 mm 36 mm head.The limb lengths were excellent the hip was stable in 90 of external rotation. At this point the trial components removed and we hammered into place a # 6 Tri-Lock stem with Gryption coating. This was a hi offset stem and a + 8.5 mm 36 mm ceramic ball was then hammered into place the hip was reduced and final C-arm images obtained. The wound was thoroughly irrigated with normal saline solution. We repaired the ant capsule and the tensor fascia lot a with running 0 vicryl suture. the subcutaneous tissue was closed with 2-0 and 3-0 Vicryl suture followed by an Aquacil dressing. At this point the patient was awaken and transferred to hospital gurney without difficulty. The subcutaneous tissue with 0 and 2-0 undyed Vicryl suture and the skin with running  3-0 vicryl subcuticular suture. Aquacil dressing was applied. The patient was then unclamped, rolled supine, awaken extubated and taken to recovery room without difficulty in stable condition.   Gean Birchwood J 07/04/2016, 2:24 PM

## 2016-07-04 NOTE — Telephone Encounter (Signed)
Order History  Outpatient  Date/Time Action Taken User Additional Information  06/29/16 0727 Susa Lofflerend Gonzalez, Danielle R   Medication Detail    Disp Refills Start End   furosemide (LASIX) 40 MG tablet [Pharmacy Med Name: FUROSEMIDE 40MG  TABLETS] 180 tablet 0 06/29/2016    Sig: TAKE 2 TABLETS BY MOUTH DAILY    WITH CHANGE IN THERAPY WILL SEND REFILL TO PHARMACY.

## 2016-07-04 NOTE — Anesthesia Postprocedure Evaluation (Addendum)
Anesthesia Post Note  Patient: [name] Newcombe  Procedure(s) Performed: Procedure(s) (LRB): LEFT TOTAL HIP ARTHROPLASTY ANTERIOR APPROACH (Left)  Patient location during evaluation: PACU Anesthesia Type: Spinal Level of consciousness: oriented and awake and alert Pain management: pain level controlled Vital Signs Assessment: post-procedure vital signs reviewed and stable Respiratory status: spontaneous breathing, respiratory function stable and patient connected to nasal cannula oxygen Cardiovascular status: blood pressure returned to baseline and stable Postop Assessment: no headache and no backache Anesthetic complications: no       Last Vitals:  Vitals:   07/04/16 1715 07/04/16 1730  BP: (!) 146/64   Pulse: (!) 59 62  Resp: 15 17  Temp:  37 C    Last Pain:  Vitals:   07/04/16 1645  TempSrc:   PainSc: 0-No pain                 ROSE,GEORGE S

## 2016-07-05 ENCOUNTER — Encounter (HOSPITAL_COMMUNITY): Payer: Self-pay | Admitting: Orthopedic Surgery

## 2016-07-05 LAB — CBC
HEMATOCRIT: 36.6 % — AB (ref 39.0–52.0)
Hemoglobin: 11.6 g/dL — ABNORMAL LOW (ref 13.0–17.0)
MCH: 29 pg (ref 26.0–34.0)
MCHC: 31.7 g/dL (ref 30.0–36.0)
MCV: 91.5 fL (ref 78.0–100.0)
Platelets: 229 10*3/uL (ref 150–400)
RBC: 4 MIL/uL — AB (ref 4.22–5.81)
RDW: 14.3 % (ref 11.5–15.5)
WBC: 13.5 10*3/uL — AB (ref 4.0–10.5)

## 2016-07-05 LAB — BASIC METABOLIC PANEL
ANION GAP: 9 (ref 5–15)
BUN: 30 mg/dL — ABNORMAL HIGH (ref 6–20)
CO2: 26 mmol/L (ref 22–32)
Calcium: 8.4 mg/dL — ABNORMAL LOW (ref 8.9–10.3)
Chloride: 102 mmol/L (ref 101–111)
Creatinine, Ser: 1.89 mg/dL — ABNORMAL HIGH (ref 0.61–1.24)
GFR calc Af Amer: 41 mL/min — ABNORMAL LOW (ref >60)
GFR calc non Af Amer: 35 mL/min — ABNORMAL LOW (ref >60)
GLUCOSE: 66 mg/dL (ref 65–99)
POTASSIUM: 4.3 mmol/L (ref 3.5–5.1)
Sodium: 137 mmol/L (ref 135–145)

## 2016-07-05 LAB — GLUCOSE, CAPILLARY
GLUCOSE-CAPILLARY: 182 mg/dL — AB (ref 65–99)
GLUCOSE-CAPILLARY: 69 mg/dL (ref 65–99)
GLUCOSE-CAPILLARY: 315 mg/dL — AB (ref 65–99)
Glucose-Capillary: 120 mg/dL — ABNORMAL HIGH (ref 65–99)
Glucose-Capillary: 162 mg/dL — ABNORMAL HIGH (ref 65–99)
Glucose-Capillary: 395 mg/dL — ABNORMAL HIGH (ref 65–99)

## 2016-07-05 MED ORDER — INSULIN ASPART 100 UNIT/ML ~~LOC~~ SOLN
10.0000 [IU] | Freq: Once | SUBCUTANEOUS | Status: AC
  Administered 2016-07-05: 10 [IU] via SUBCUTANEOUS

## 2016-07-05 NOTE — Plan of Care (Signed)
Problem: Safety: Goal: Ability to remain free from injury will improve Outcome: Progressing No falls during this admission. Patient alert and oriented. Call bell within reach. Bed in low and locked position. Clean and clear environment maintained. Nonskid footwear being utilized. 3/4 siderails in place. Patient verbalized understanding of safety instruction.  Problem: Pain Management: Goal: Pain level will decrease with appropriate interventions Outcome: Progressing Pain being managed with PO PRN pain medication. No facial grimacing and moaning evident. Vital signs are stable.

## 2016-07-05 NOTE — Progress Notes (Signed)
Inpatient Diabetes Program Recommendations  AACE/ADA: New Consensus Statement on Inpatient Glycemic Control (2015)  Target Ranges:  Prepandial:   less than 140 mg/dL      Peak postprandial:   less than 180 mg/dL (1-2 hours)      Critically ill patients:  140 - 180 mg/dL   Review of Glycemic Control  Diabetes history: DM 2 Outpatient Diabetes medications: Humulin U-500 80 units BID Current orders for Inpatient glycemic control: Humulin U-500, Novolog Moderate 0-15 units tid  A1c 6.6% on 06/26/16  Glucose 69 early this am, now 120 mg/dl. Got 70 units of U-500 last night received 10 decadron this am. Watching trends today.  Thanks,  Christena DeemShannon Lonnette Shrode RN, MSN, [facility] Inpatient Diabetes Coordinator Team Pager [phone] (8a-5p)

## 2016-07-05 NOTE — Evaluation (Signed)
Physical Therapy Evaluation Patient Details Name: Justin Bartlett MRN: [id number] DOB: 1950-03-30 Today's Date: 07/05/2016   History of Present Illness  Pt admit for left THA direct anterior approach.  PMH: HTN, CAD,PVD, stents, DM., CVA  Clinical Impression  Pt admitted with above diagnosis. Pt currently with functional limitations due to the deficits listed below (see PT Problem List). Pt was able to ambulate but toward end of walk, pt with bil LEs "jumping/buckling".  Once in chair, no "tremors/jumpiness" noted.  Tolerated first session well overall.  Wife states she can only provide Supervision at home. May need a short rehab stay.  Will follow acutely.  Pt will benefit from skilled PT to increase their independence and safety with mobility to allow discharge to the venue listed below.      Follow Up Recommendations SNF;Supervision/Assistance - 24 hour    Equipment Recommendations  Hospital bed    Recommendations for Other Services       Precautions / Restrictions Precautions Precautions: Fall Restrictions Weight Bearing Restrictions: Yes LLE Weight Bearing: Weight bearing as tolerated      Mobility  Bed Mobility Overal bed mobility: Needs Assistance Bed Mobility: Supine to Sit     Supine to sit: Mod assist     General bed mobility comments: Needed assist for LES, used bed rail and assist to elevat e trunk with incr time to come to EOB.   Transfers Overall transfer level: Needs assistance Equipment used: Rolling walker (2 wheeled) Transfers: Sit to/from Stand Sit to Stand: Mod assist;+2 physical assistance;From elevated surface         General transfer comment: Needed assist to  power up and incr time to get balance once up with steadying assist.   Ambulation/Gait Ambulation/Gait assistance: Mod assist;Min assist;+2 safety/equipment Ambulation Distance (Feet): 70 Feet Assistive device: Rolling walker (2 wheeled) Gait Pattern/deviations: Step-to pattern;Decreased  step length - left;Decreased stance time - left;Decreased weight shift to left;Leaning posteriorly;Antalgic;Staggering left;Staggering right;Trunk flexed   Gait velocity interpretation: Below normal speed for age/gender General Gait Details: Pt was able to ambulate with RW with cues for sequencing steps and RW.  Pt steps were better the longer he walked however after ambulating about 70 feet, the pts LEs became shaky and they were almost "jumpy.".Once he sat, pt was ok and left in chair. Had to bring chair to pt as he had to sit due to "jump" legs.   Stairs            Wheelchair Mobility    Modified Rankin (Stroke Patients Only)       Balance Overall balance assessment: Needs assistance;History of Falls Sitting-balance support: No upper extremity supported;Feet supported Sitting balance-Leahy Scale: Fair     Standing balance support: Bilateral upper extremity supported;During functional activity Standing balance-Leahy Scale: Poor Standing balance comment: relies on UEs for support                             Pertinent Vitals/Pain Pain Assessment: 0-10 Pain Score: 2  Pain Location: hip left  Pain Descriptors / Indicators: Aching;Guarding;Grimacing Pain Intervention(s): Limited activity within patient's tolerance;Monitored during session;Premedicated before session;Repositioned    Home Living Family/patient expects to be discharged to:: Private residence Living Arrangements: Spouse/significant other Available Help at Discharge: Family;Available 24 hours/day Type of Home: House Home Access: Stairs to enter Entrance Stairs-Rails: None Entrance Stairs-Number of Steps: 1 Home Layout: Two level;1/2 bath on main level;Bed/bath upstairs Home Equipment: Walker - 2 wheels;Cane -  single point      Prior Function Level of Independence: Independent with assistive device(s)         Comments: used rW vs cane PTA     Hand Dominance   Dominant Hand: Left     Extremity/Trunk Assessment   Upper Extremity Assessment Upper Extremity Assessment: Defer to OT evaluation    Lower Extremity Assessment Lower Extremity Assessment: LLE deficits/detail LLE Deficits / Details: grossly 3-/5 LLE: Unable to fully assess due to pain    Cervical / Trunk Assessment Cervical / Trunk Assessment: Kyphotic  Communication   Communication: No difficulties  Cognition Arousal/Alertness: Awake/alert Behavior During Therapy: WFL for tasks assessed/performed Overall Cognitive Status: Within Functional Limits for tasks assessed                                        General Comments      Exercises General Exercises - Lower Extremity Ankle Circles/Pumps: AROM;Both;10 reps;Supine Quad Sets: AROM;Both;10 reps;Supine Long Arc Quad: AROM;Both;10 reps;Seated   Assessment/Plan    PT Assessment Patient needs continued PT services  PT Problem List Decreased strength;Decreased activity tolerance;Decreased balance;Decreased range of motion;Decreased mobility;Decreased knowledge of use of DME;Decreased safety awareness;Decreased knowledge of precautions;Pain       PT Treatment Interventions DME instruction;Gait training;Stair training;Functional mobility training;Therapeutic activities;Therapeutic exercise;Balance training;Patient/family education    PT Goals (Current goals can be found in the Care Plan section)  Acute Rehab PT Goals Patient Stated Goal: to go home PT Goal Formulation: With patient Time For Goal Achievement: 07/19/16 Potential to Achieve Goals: Good    Frequency 7X/week   Barriers to discharge Decreased caregiver support wife states she can do supervsion only    Co-evaluation               AM-PAC PT "6 Clicks" Daily Activity  Outcome Measure Difficulty turning over in bed (including adjusting bedclothes, sheets and blankets)?: A Lot Difficulty moving from lying on back to sitting on the side of the bed? : A  Lot Difficulty sitting down on and standing up from a chair with arms (e.g., wheelchair, bedside commode, etc,.)?: A Lot Help needed moving to and from a bed to chair (including a wheelchair)?: A Lot Help needed walking in hospital room?: A Lot Help needed climbing 3-5 steps with a railing? : Total 6 Click Score: 11    End of Session Equipment Utilized During Treatment: Gait belt Activity Tolerance: Patient limited by fatigue;Patient limited by pain Patient left: in chair;with call bell/phone within reach;with chair alarm set;with family/visitor present Nurse Communication: Mobility status PT Visit Diagnosis: Muscle weakness (generalized) (M62.81);Pain;Unsteadiness on feet (R26.81) Pain - Right/Left: Left Pain - part of body: Leg    Time: [id number] PT Time Calculation (min) (ACUTE ONLY): 30 min   Charges:   PT Evaluation $PT Eval Moderate Complexity: 1 Procedure PT Treatments $Gait Training: 8-22 mins   PT G Codes:        Justin Bartlett,PT Acute Rehabilitation [phone] (pager)   Justin Bartlett 07/05/2016, 1:56 PM

## 2016-07-05 NOTE — Progress Notes (Signed)
PATIENT ID: Justin Bartlett Kriz  MRN: [id number]  DOB/AGE:  10/29/1950 / 66 y.o.  1 Day Post-Op Procedure(s) (LRB): LEFT TOTAL HIP ARTHROPLASTY ANTERIOR APPROACH (Left)    PROGRESS NOTE Subjective: Patient is alert, oriented, no Nausea, no Vomiting, yes passing gas, . Taking PO well. Denies SOB, Chest or Calf Pain. Using Incentive Spirometer, PAS in place. Ambulate WBAT Patient reports pain as  2/10  .    Objective: Vital signs in last 24 hours: Vitals:   07/04/16 1736 07/04/16 1951 07/04/16 2300 07/05/16 0538  BP: 135/64 (!) 151/57 (!) 132/51 121/68  Pulse: 64 78 (!) 107 (!) 109  Resp: (!) 24 18 18 18   Temp:  98.4 F (36.9 C) 99.7 F (37.6 C) (!) 100.4 F (38 C)  TempSrc:  Oral Oral Oral  SpO2: 100% 97% 94% 92%  Weight:      Height:          Intake/Output from previous day: I/O last 3 completed shifts: In: 2137.1 [P.O.:100; I.V.:2037.1] Out: 500 [Urine:350; Blood:150]   Intake/Output this shift: No intake/output data recorded.   LABORATORY DATA:  Recent Labs  07/05/16 0129 07/05/16 0644 07/05/16 0656 07/05/16 0839  WBC  --  13.5*  --   --   HGB  --  11.6*  --   --   HCT  --  36.6*  --   --   PLT  --  229  --   --   NA  --  137  --   --   K  --  4.3  --   --   CL  --  102  --   --   CO2  --  26  --   --   BUN  --  30*  --   --   CREATININE  --  1.89*  --   --   GLUCOSE  --  66  --   --   GLUCAP 182*  --  69 120*  CALCIUM  --  8.4*  --   --     Examination: Neurologically intact ABD soft Neurovascular intact Sensation intact distally Intact pulses distally Dorsiflexion/Plantar flexion intact Incision: dressing C/D/I No cellulitis present Compartment soft} XR AP&Lat of hip shows well placed\fixed THA  Assessment:   1 Day Post-Op Procedure(s) (LRB): LEFT TOTAL HIP ARTHROPLASTY ANTERIOR APPROACH (Left) ADDITIONAL DIAGNOSIS:  Expected Acute Blood Loss Anemia, Diabetes,History of coronary artery disease, History of ischemic cardiomyopathy, Morbid obesity,  Stage IV kidney disease, History of combined systolic and diastolic congestive heart failure Plan: PT/OT WBAT, THA  DVT Prophylaxis: SCDx72 hrs, Continue Aggrenox on schedule  DISCHARGE PLAN: Home versus skilled nursing center.  Based on how he does with physical therapy today  DISCHARGE NEEDS: HHPT, Walker and 3-in-1 comode seatPatient ID: Justin Bartlett Legere, male   DOB: 05/26/1950, 66 y.o.   MRN: [id number]

## 2016-07-05 NOTE — Progress Notes (Signed)
   07/05/16 1400  PT Visit Information  Last PT Received On 07/05/16  Assistance Needed +2  History of Present Illness Pt admit for left THA direct anterior approach.  PMH: HTN, CAD,PVD, stents, DM., CVA  Precautions  Precautions Fall  Restrictions  Weight Bearing Restrictions Yes  LLE Weight Bearing WBAT  Pain Assessment  Pain Assessment 0-10  Pain Score 5  Pain Location hip left   Pain Descriptors / Indicators Aching;Guarding;Grimacing  Pain Intervention(s) Limited activity within patient's tolerance;Monitored during session;Premedicated before session;Repositioned  Cognition  Arousal/Alertness Awake/alert  Behavior During Therapy WFL for tasks assessed/performed  Overall Cognitive Status Within Functional Limits for tasks assessed  Bed Mobility  General bed mobility comments in chair on arrival  Transfers  Overall transfer level Needs assistance  Equipment used Rolling walker (2 wheeled)  Transfers Sit to/from Stand  Sit to Stand Mod assist;Max assist  General transfer comment Needed assist to  power up and incr time to get balance once up however pt LEs buckling/jumping as previously but worse than earlier session.  Noted pt getting potassium.  Deferred ambulation and performed exercise instead.    Balance  Standing balance support Bilateral upper extremity supported;During functional activity  Standing balance-Leahy Scale Poor  Standing balance comment relies on UEs heavily for support  Exercises  Exercises General Lower Extremity  General Exercises - Lower Extremity  Ankle Circles/Pumps AROM;Both;10 reps;Supine  Quad Sets AROM;Both;10 reps;Supine  Long Arc Quad AROM;Both;10 reps;Seated  Gluteal Sets AROM;Both;10 reps;Supine  Heel Slides AAROM;Left;10 reps;Supine  Hip ABduction/ADduction AROM;Left;10 reps;Supine  PT - End of Session  Equipment Utilized During Treatment Gait belt  Activity Tolerance Patient limited by fatigue;Patient limited by pain  Patient left in  chair;with call bell/phone within reach;with chair alarm set;with family/visitor present  Nurse Communication Mobility status  PT - Assessment/Plan  PT Plan Current plan remains appropriate  PT Visit Diagnosis Muscle weakness (generalized) (M62.81);Pain;Unsteadiness on feet (R26.81)  Pain - Right/Left Left  Pain - part of body Leg  PT Frequency (ACUTE ONLY) 7X/week  Follow Up Recommendations SNF;Supervision/Assistance - 24 hour  PT equipment Hospital bed  AM-PAC PT "6 Clicks" Daily Activity Outcome Measure  Difficulty turning over in bed (including adjusting bedclothes, sheets and blankets)? 2  Difficulty moving from lying on back to sitting on the side of the bed?  2  Difficulty sitting down on and standing up from a chair with arms (e.g., wheelchair, bedside commode, etc,.)? 2  Help needed moving to and from a bed to chair (including a wheelchair)? 2  Help needed walking in hospital room? 2  Help needed climbing 3-5 steps with a railing?  1  6 Click Score 11  Mobility G Code  CL  PT Goal Progression  Progress towards PT goals Progressing toward goals  PT Time Calculation  PT Start Time (ACUTE ONLY) 1235  PT Stop Time (ACUTE ONLY) 1307  PT Time Calculation (min) (ACUTE ONLY) 32 min  PT General Charges  $$ ACUTE PT VISIT 1 Procedure  PT Treatments  $Therapeutic Exercise 8-22 mins  $Therapeutic Activity 8-22 mins  Pt could not achieve steady standing this pm with muscle fasciculations when standing in LES.  Decided to only perform LE exercises this 2nd session.  Pt and wife interested in SNF and CM/SW notified. Will follow acutely. Thanks.  [facility] Velisa Bartlett,PT Acute Rehabilitation [phone] (pager)

## 2016-07-06 LAB — CBC
HCT: 32 % — ABNORMAL LOW (ref 39.0–52.0)
Hemoglobin: 10.2 g/dL — ABNORMAL LOW (ref 13.0–17.0)
MCH: 28.6 pg (ref 26.0–34.0)
MCHC: 31.9 g/dL (ref 30.0–36.0)
MCV: 89.6 fL (ref 78.0–100.0)
PLATELETS: 206 10*3/uL (ref 150–400)
RBC: 3.57 MIL/uL — ABNORMAL LOW (ref 4.22–5.81)
RDW: 14 % (ref 11.5–15.5)
WBC: 20.3 10*3/uL — AB (ref 4.0–10.5)

## 2016-07-06 LAB — GLUCOSE, CAPILLARY
Glucose-Capillary: 262 mg/dL — ABNORMAL HIGH (ref 65–99)
Glucose-Capillary: 200 mg/dL — ABNORMAL HIGH (ref 65–99)
Glucose-Capillary: 135 mg/dL — ABNORMAL HIGH (ref 65–99)

## 2016-07-06 LAB — HEMOGLOBIN A1C
Hgb A1c MFr Bld: 6.5 % — ABNORMAL HIGH (ref 4.8–5.6)
Mean Plasma Glucose: 140 mg/dL

## 2016-07-06 NOTE — Progress Notes (Signed)
Reviewed discharge instructions/medications with patient.  Answered his questions.  Patient is stable and ready for discharge.

## 2016-07-06 NOTE — Addendum Note (Signed)
Addendum  created 07/06/16 1505 by Sharee HolsterMassagee, Jackalynn Art, MD   Sign clinical note

## 2016-07-06 NOTE — Discharge Summary (Signed)
Patient ID: Justin Bartlett MRN: [id number] DOB/AGE: 66/07/1950 66 y.o.  Admit date: 07/04/2016 Discharge date: 07/06/2016  Admission Diagnoses:  Principal Problem:   Primary osteoarthritis of left hip Active Problems:   Primary localized osteoarthritis of left hip   Discharge Diagnoses:  Same  Past Medical History:  Diagnosis Date  . Arthritis   . CAD in native artery 02/1999   Subtotal LAD & servere RCA, ~40-50% LM --> CABG  . CHF (congestive heart failure) (HCC)    15  . Chronic kidney disease   . Diabetes mellitus    insulin dependent;takes Humulin R  . Gallstones   . History of stomach ulcers    upon EGD with Dr. Adaline SillEdwards;takes Omeprazole daily  . Hypercholesterolemia    takes Vytorin nightly  . Hypertension    takes Ramipril,Metoprolol,and Diltiazem daily  . Obesity   . Peripheral edema    wears compression stockings daily  . 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   . Stroke (HCC) 2006  . Toenail fungus    Lamisil on feet daily and Sporanox daily    Surgeries: Procedure(s): LEFT TOTAL HIP ARTHROPLASTY ANTERIOR APPROACH on 07/04/2016   Consultants:   Discharged Condition: Improved  Hospital Course: Justin Bartlett is an 66 y.o. male who was admitted 07/04/2016 for operative treatment ofPrimary osteoarthritis of left hip. Patient has severe unremitting pain that affects sleep, daily activities, and work/hobbies. After pre-op clearance the patient was taken to the operating room on 07/04/2016 and underwent  Procedure(s): LEFT TOTAL HIP ARTHROPLASTY ANTERIOR APPROACH.    Patient was given perioperative antibiotics: Anti-infectives    Start     Dose/Rate Route Frequency Ordered Stop   07/04/16 1230  ceFAZolin (ANCEF) 3 g in dextrose 5 % 50 mL IVPB     3 g 130 mL/hr over 30 Minutes Intravenous To ShortStay Surgical 07/03/16 1155 07/04/16 1244       Patient was given sequential  compression devices, early ambulation, and chemoprophylaxis to prevent DVT.  Patient benefited maximally from hospital stay and there were no complications.    Recent vital signs: Patient Vitals for the past 24 hrs:  BP Temp Temp src Pulse Resp SpO2  07/06/16 0814 129/60 - - 70 - 97 %  07/06/16 0540 126/74 98.1 F (36.7 C) Oral 72 18 96 %  07/05/16 1931 (!) 149/56 99.8 F (37.7 C) Oral 81 18 96 %  07/05/16 1510 (!) 111/47 99 F (37.2 C) Oral 74 18 95 %     Recent laboratory studies:  Recent Labs  07/05/16 0644 07/06/16 0705  WBC 13.5* 20.3*  HGB 11.6* 10.2*  HCT 36.6* 32.0*  PLT 229 206  NA 137  --   K 4.3  --   CL 102  --   CO2 26  --   BUN 30*  --   CREATININE 1.89*  --   GLUCOSE 66  --   CALCIUM 8.4*  --      Discharge Medications:   Allergies as of 07/06/2016      Reactions   Actos [pioglitazone] Other (See Comments)   Elevated kidney function   Hctz [hydrochlorothiazide] Other (See Comments)   BP issues.       Medication List    TAKE these medications   ACCU-CHEK AVIVA PLUS test strip Generic drug:  glucose blood as directed.   aspirin EC 325 MG tablet Take 1 tablet (325  mg total) by mouth 2 (two) times daily.   atorvastatin 40 MG tablet Commonly known as:  LIPITOR TAKE 1 TABLET(40 MG) BY MOUTH DAILY   cholecalciferol 1000 units tablet Commonly known as:  VITAMIN D Take 3,000 Units by mouth daily.   dipyridamole-aspirin 200-25 MG 12hr capsule Commonly known as:  AGGRENOX Take 1 capsule by mouth 2 (two) times daily.   docusate sodium 100 MG capsule Commonly known as:  COLACE Take 100 mg by mouth 2 (two) times daily.   furosemide 40 MG tablet Commonly known as:  LASIX Take 80 mg by mouth 2 (two) times daily. What changed:  Another medication with the same name was added. Make sure you understand how and when to take each.   furosemide 40 MG tablet Commonly known as:  LASIX TAKE 2 TABLETS BY MOUTH DAILY What changed:  You were already  taking a medication with the same name, and this prescription was added. Make sure you understand how and when to take each.   HUMULIN R U-500 KWIKPEN 500 UNIT/ML kwikpen Generic drug:  insulin regular human CONCENTRATED Inject 80 Units into the skin 2 (two) times daily. Inject 80 units under the skin in the morning and at night. No longer take januvia(taken off by dr 1 week ago)   hydrALAZINE 100 MG tablet Commonly known as:  APRESOLINE Take 1 tablet (100 mg total) by mouth 3 (three) times daily.   INSULIN SYRINGE .3CC/29GX1/2" 29G X 1/2" 0.3 ML Misc Inject as directed as directed.   isosorbide mononitrate 120 MG 24 hr tablet Commonly known as:  IMDUR TAKE 1 TABLET(120 MG) BY MOUTH DAILY   metoprolol tartrate 100 MG tablet Commonly known as:  LOPRESSOR Take 100 mg by mouth 2 (two) times daily.   multivitamin with minerals Tabs tablet Take 1 tablet by mouth daily.   oxyCODONE-acetaminophen 5-325 MG tablet Commonly known as:  ROXICET Take 1 tablet by mouth every 4 (four) hours as needed.   tiZANidine 2 MG tablet Commonly known as:  ZANAFLEX Take 1 tablet (2 mg total) by mouth every 6 (six) hours as needed for muscle spasms.            Durable Medical Equipment        Start     Ordered   07/04/16 1816  DME Walker rolling  Once    Question:  Patient needs a walker to treat with the following condition  Answer:  Primary localized osteoarthritis of left hip   07/04/16 1816   07/04/16 1816  DME 3 n 1  Once     07/04/16 1816   07/04/16 1816  DME Bedside commode  Once    Question:  Patient needs a bedside commode to treat with the following condition  Answer:  Primary localized osteoarthritis of left hip   07/04/16 1816      Diagnostic Studies: Dg Chest 2 View  Result Date: 06/26/2016 CLINICAL DATA:  Preop for hip arthroplasty. EXAM: CHEST  2 VIEW COMPARISON:  Radiographs of May 10, 2014. FINDINGS: The heart size and mediastinal contours are within normal limits.  Status post coronary artery bypass graft. No pneumothorax or pleural effusion is noted. Both lungs are clear. The visualized skeletal structures are unremarkable. IMPRESSION: No active cardiopulmonary disease. Electronically Signed   By: Lupita Raider, M.D.   On: 06/26/2016 09:27   Dg C-arm 61-120 Min  Result Date: 07/04/2016 CLINICAL DATA:  Left anterior approach hip replacement. Reported fluoro time is 30 seconds. EXAM:  OPERATIVE left HIP (WITH PELVIS IF PERFORMED) 2 VIEWS TECHNIQUE: Fluoroscopic spot image(s) were submitted for interpretation post-operatively. COMPARISON:  Preoperative study of November 25, 2015. FINDINGS: The patient has undergone left total hip joint prosthesis placement. Radiographic positioning of the prosthetic components is good. The interface with the native bone appears normal. No acute native bone abnormality is observed. IMPRESSION: There is no immediate postprocedure complication following left hip joint prosthesis placement. Electronically Signed   By: David  [place] M.D.   On: 07/04/2016 14:29   Dg Hip Operative Unilat With Pelvis Left  Result Date: 07/04/2016 CLINICAL DATA:  Left anterior approach hip replacement. Reported fluoro time is 30 seconds. EXAM: OPERATIVE left HIP (WITH PELVIS IF PERFORMED) 2 VIEWS TECHNIQUE: Fluoroscopic spot image(s) were submitted for interpretation post-operatively. COMPARISON:  Preoperative study of November 25, 2015. FINDINGS: The patient has undergone left total hip joint prosthesis placement. Radiographic positioning of the prosthetic components is good. The interface with the native bone appears normal. No acute native bone abnormality is observed. IMPRESSION: There is no immediate postprocedure complication following left hip joint prosthesis placement. Electronically Signed   By: David  [place] M.D.   On: 07/04/2016 14:29    Disposition: 01-Home or Self Care  Discharge Instructions    Call MD / Call 911    Complete by:  As directed     If you experience chest pain or shortness of breath, CALL 911 and be transported to the hospital emergency room.  If you develope a fever above 101 F, pus (white drainage) or increased drainage or redness at the wound, or calf pain, call your surgeon's office.   Constipation Prevention    Complete by:  As directed    Drink plenty of fluids.  Prune juice may be helpful.  You may use a stool softener, such as Colace (over the counter) 100 mg twice a day.  Use MiraLax (over the counter) for constipation as needed.   Diet - low sodium heart healthy    Complete by:  As directed    Driving restrictions    Complete by:  As directed    No driving for 2 weeks   Follow the hip precautions as taught in Physical Therapy    Complete by:  As directed    Increase activity slowly as tolerated    Complete by:  As directed    Patient may shower    Complete by:  As directed    You may shower without a dressing once there is no drainage.  Do not wash over the wound.  If drainage remains, cover wound with plastic wrap and then shower.      Follow-up Information    Gean Birchwood, MD Follow up in 2 week(s).   Specialty:  Orthopedic Surgery Contact information: [address] [place] [address] [phone]            Signed: Vear Clock, Kaegan Hettich R 07/06/2016, 10:07 AM

## 2016-07-06 NOTE — Progress Notes (Signed)
PATIENT ID: Justin Bartlett  MRN: [id number]  DOB/AGE:  06/01/1950 / 66 y.o.  2 Days Post-Op Procedure(s) (LRB): LEFT TOTAL HIP ARTHROPLASTY ANTERIOR APPROACH (Left)    PROGRESS NOTE Subjective: Patient is alert, oriented, no Nausea, no Vomiting, yes passing gas, . Taking PO well. Denies SOB, Chest or Calf Pain. Using Incentive Spirometer, PAS in place. Ambulate WBAT with pt walking 70 ft with therapy yesterday Patient reports pain as  0/10  .    Objective: Vital signs in last 24 hours: Vitals:   07/05/16 1510 07/05/16 1931 07/06/16 0540 07/06/16 0814  BP: (!) 111/47 (!) 149/56 126/74 129/60  Pulse: 74 81 72 70  Resp: 18 18 18    Temp: 99 F (37.2 C) 99.8 F (37.7 C) 98.1 F (36.7 C)   TempSrc: Oral Oral Oral   SpO2: 95% 96% 96% 97%  Weight:      Height:          Intake/Output from previous day: I/O last 3 completed shifts: In: 2822.1 [P.O.:960; I.V.:1862.1] Out: 400 [Urine:400]   Intake/Output this shift: No intake/output data recorded.   LABORATORY DATA:  Recent Labs  07/05/16 0644  07/05/16 2153 07/06/16 0256 07/06/16 0605 07/06/16 0705  WBC 13.5*  --   --   --   --  20.3*  HGB 11.6*  --   --   --   --  10.2*  HCT 36.6*  --   --   --   --  32.0*  PLT 229  --   --   --   --  206  NA 137  --   --   --   --   --   K 4.3  --   --   --   --   --   CL 102  --   --   --   --   --   CO2 26  --   --   --   --   --   BUN 30*  --   --   --   --   --   CREATININE 1.89*  --   --   --   --   --   GLUCOSE 66  --   --   --   --   --   GLUCAP  --   < > 395* 262* 200*  --   CALCIUM 8.4*  --   --   --   --   --   < > = values in this interval not displayed.  Examination: Neurologically intact Neurovascular intact Sensation intact distally Intact pulses distally Dorsiflexion/Plantar flexion intact Incision: dressing C/D/I and no drainage Compartment soft} XR AP&Lat of hip shows well placed\fixed THA  Assessment:   2 Days Post-Op Procedure(s) (LRB): LEFT TOTAL HIP  ARTHROPLASTY ANTERIOR APPROACH (Left) ADDITIONAL DIAGNOSIS:  Expected Acute Blood Loss Anemia, Diabetes, Renal Insufficiency Chronic and Hx of PE and CAD  Plan: PT/OT WBAT, THA  DVT Prophylaxis: SCDx72 hrs, ASA 325 mg BID x 2 weeks  DISCHARGE PLAN: Home  DISCHARGE NEEDS: HHPT, Walker and 3-in-1 comode seat

## 2016-07-06 NOTE — Care Management Note (Signed)
Case Management Note  Patient Details  Name: Justin Bartlett MRN: [id number] Date of Birth: 03/03/1950  Subjective/Objective:   66 yr old gentleman s/p Left total hip hemiarthroplasty, anterior approach.                 Action/Plan: Case manager spoke with patient and his wife concerning Home Health and DME needs. Patient was preoperatively setup with [facility], no changes. Patient has requested Hospital bed,   Expected Discharge Date:  07/06/16               Expected Discharge Plan:  Home w Home Health Services  In-House Referral:  NA  Discharge planning Services  CM Consult  Post Acute Care Choice:  Durable Medical Equipment, Home Health Choice offered to:  Patient, Spouse  DME Arranged:  3-N-1, Hospital bed DME Agency:  [facility]  [facility]  Status of Service:  Completed, signed off  If discussed at Long Length of Stay Meetings, dates discussed:    Additional Comments:  Durenda GuthrieBrady, Georgia Delsignore Naomi, RN 07/06/2016, 12:38 PM

## 2016-07-06 NOTE — Progress Notes (Signed)
Physical Therapy Treatment Patient Details Name: Justin Bartlett MRN: [id number] DOB: 22-Jan-1951 Today's Date: 07/06/2016    History of Present Illness Pt admit for left THA direct anterior approach.  PMH: HTN, CAD,PVD, stents, DM., CVA    PT Comments    Pt progressing well with mobility and eager for return home.  Spouse present to review car transfers, stairs and bed mobility.   Follow Up Recommendations  Supervision/Assistance - 24 hour;Home health PT     Equipment Recommendations  Hospital bed    Recommendations for Other Services       Precautions / Restrictions Precautions Precautions: Fall Restrictions Weight Bearing Restrictions: No LLE Weight Bearing: Weight bearing as tolerated    Mobility  Bed Mobility Overal bed mobility: Needs Assistance Bed Mobility: Supine to Sit     Supine to sit: Min assist     General bed mobility comments: Pt declines to attempt - reviewed with pt and spouse  Transfers Overall transfer level: Needs assistance Equipment used: Rolling walker (2 wheeled) Transfers: Sit to/from Stand Sit to Stand: Min guard;Supervision         General transfer comment: pt self-cues for LE management and use of UEs to self assist  Ambulation/Gait Ambulation/Gait assistance: Min guard;Supervision Ambulation Distance (Feet): 140 Feet Assistive device: Rolling walker (2 wheeled) Gait Pattern/deviations: Step-to pattern;Step-through pattern;Decreased step length - right;Decreased step length - left;Shuffle;Trunk flexed;Antalgic Gait velocity: decr Gait velocity interpretation: Below normal speed for age/gender General Gait Details: cues for posture, position from RW and initial sequence   Stairs Stairs: Yes   Stair Management: No rails;Forwards;Step to pattern;With walker Number of Stairs: 1 General stair comments: min cues for sequence and foot/RW placement - spouse present  Wheelchair Mobility    Modified Rankin (Stroke Patients Only)        Balance Overall balance assessment: History of Falls Sitting-balance support: No upper extremity supported;Feet supported Sitting balance-Leahy Scale: Good     Standing balance support: No upper extremity supported Standing balance-Leahy Scale: Fair                              Cognition Arousal/Alertness: Awake/alert Behavior During Therapy: WFL for tasks assessed/performed Overall Cognitive Status: Within Functional Limits for tasks assessed                                        Exercises General Exercises - Lower Extremity Ankle Circles/Pumps: AROM;Both;Supine;20 reps Quad Sets: AROM;Both;10 reps;Supine Long Arc Quad: AROM;Both;10 reps;Seated Heel Slides: AAROM;Left;Supine;20 reps Hip ABduction/ADduction: AROM;Left;Supine;20 reps    General Comments        Pertinent Vitals/Pain Pain Assessment: 0-10 Pain Score: 4  Pain Location: hip left  Pain Descriptors / Indicators: Sore Pain Intervention(s): Limited activity within patient's tolerance;Monitored during session;Ice applied    Home Living                      Prior Function            PT Goals (current goals can now be found in the care plan section) Acute Rehab PT Goals Patient Stated Goal: to go home PT Goal Formulation: With patient Time For Goal Achievement: 07/19/16 Potential to Achieve Goals: Good Progress towards PT goals: Progressing toward goals    Frequency    7X/week      PT Plan Discharge plan  needs to be updated    Co-evaluation              AM-PAC PT "6 Clicks" Daily Activity  Outcome Measure  Difficulty turning over in bed (including adjusting bedclothes, sheets and blankets)?: A Little Difficulty moving from lying on back to sitting on the side of the bed? : A Little Difficulty sitting down on and standing up from a chair with arms (e.g., wheelchair, bedside commode, etc,.)?: A Little Help needed moving to and from a bed to  chair (including a wheelchair)?: A Little Help needed walking in hospital room?: A Little Help needed climbing 3-5 steps with a railing? : A Little 6 Click Score: 18    End of Session Equipment Utilized During Treatment: Gait belt Activity Tolerance: Patient tolerated treatment well Patient left: in chair;with call bell/phone within reach;with chair alarm set Nurse Communication: Mobility status PT Visit Diagnosis: Muscle weakness (generalized) (M62.81);Pain;Unsteadiness on feet (R26.81);Difficulty in walking, not elsewhere classified (R26.2) Pain - Right/Left: Left Pain - part of body: Leg     Time: 1345-1410 PT Time Calculation (min) (ACUTE ONLY): 25 min  Charges:  $Gait Training: 8-22 mins $Therapeutic Exercise: 8-22 mins $Therapeutic Activity: 8-22 mins                    G Codes:       Pg [phone]    Aristides Luckey 07/06/2016, 2:55 PM

## 2016-07-06 NOTE — Progress Notes (Signed)
Physical Therapy Treatment Patient Details Name: Justin Bartlett MRN: [id number] DOB: 1950/03/26 Today's Date: 07/06/2016    History of Present Illness Pt admit for left THA direct anterior approach.  PMH: HTN, CAD,PVD, stents, DM., CVA    PT Comments    Marked improvement in activity tolerance.  Pt hopeful for dc home this date.   Follow Up Recommendations  Supervision/Assistance - 24 hour;Home health PT     Equipment Recommendations  Hospital bed    Recommendations for Other Services       Precautions / Restrictions Precautions Precautions: Fall Restrictions Weight Bearing Restrictions: No LLE Weight Bearing: Weight bearing as tolerated    Mobility  Bed Mobility Overal bed mobility: Needs Assistance Bed Mobility: Supine to Sit     Supine to sit: Min assist     General bed mobility comments: increased time with cues for sequence and use of R LE to self assist  Transfers Overall transfer level: Needs assistance Equipment used: Rolling walker (2 wheeled) Transfers: Sit to/from Stand Sit to Stand: Min guard         General transfer comment: cues for LE management and use of UEs to self assist  Ambulation/Gait Ambulation/Gait assistance: Min assist;Min guard Ambulation Distance (Feet): 160 Feet Assistive device: Rolling walker (2 wheeled) Gait Pattern/deviations: Step-to pattern;Step-through pattern;Decreased step length - right;Decreased step length - left;Shuffle;Trunk flexed Gait velocity: decr Gait velocity interpretation: Below normal speed for age/gender General Gait Details: cues for posture, position from RW and initial sequence   Stairs            Wheelchair Mobility    Modified Rankin (Stroke Patients Only)       Balance Overall balance assessment: History of Falls Sitting-balance support: No upper extremity supported;Feet supported Sitting balance-Leahy Scale: Good     Standing balance support: No upper extremity  supported Standing balance-Leahy Scale: Fair                              Cognition Arousal/Alertness: Awake/alert Behavior During Therapy: WFL for tasks assessed/performed Overall Cognitive Status: Within Functional Limits for tasks assessed                                        Exercises General Exercises - Lower Extremity Ankle Circles/Pumps: AROM;Both;Supine;20 reps Quad Sets: AROM;Both;10 reps;Supine Long Arc Quad: AROM;Both;10 reps;Seated Heel Slides: AAROM;Left;Supine;20 reps Hip ABduction/ADduction: AROM;Left;Supine;20 reps    General Comments        Pertinent Vitals/Pain Pain Assessment: 0-10 Pain Score: 2  Pain Location: hip left  Pain Descriptors / Indicators: Sore Pain Intervention(s): Limited activity within patient's tolerance;Monitored during session;Ice applied    Home Living                      Prior Function            PT Goals (current goals can now be found in the care plan section) Acute Rehab PT Goals Patient Stated Goal: to go home PT Goal Formulation: With patient Time For Goal Achievement: 07/19/16 Potential to Achieve Goals: Good Progress towards PT goals: Progressing toward goals    Frequency    7X/week      PT Plan Discharge plan needs to be updated    Co-evaluation              AM-PAC  PT "6 Clicks" Daily Activity  Outcome Measure  Difficulty turning over in bed (including adjusting bedclothes, sheets and blankets)?: A Little Difficulty moving from lying on back to sitting on the side of the bed? : A Little Difficulty sitting down on and standing up from a chair with arms (e.g., wheelchair, bedside commode, etc,.)?: A Little Help needed moving to and from a bed to chair (including a wheelchair)?: A Little Help needed walking in hospital room?: A Little Help needed climbing 3-5 steps with a railing? : A Little 6 Click Score: 18    End of Session Equipment Utilized During  Treatment: Gait belt Activity Tolerance: Patient tolerated treatment well Patient left: in chair;with call bell/phone within reach;with chair alarm set Nurse Communication: Mobility status PT Visit Diagnosis: Muscle weakness (generalized) (M62.81);Pain;Unsteadiness on feet (R26.81);Difficulty in walking, not elsewhere classified (R26.2) Pain - Right/Left: Left Pain - part of body: Leg     Time: 1003-1031 PT Time Calculation (min) (ACUTE ONLY): 28 min  Charges:  $Gait Training: 8-22 mins $Therapeutic Exercise: 8-22 mins                    G Codes:       Pg [phone]    Affie Gasner 07/06/2016, 12:50 PM

## 2016-07-18 ENCOUNTER — Other Ambulatory Visit: Payer: Self-pay | Admitting: Nurse Practitioner

## 2016-08-10 ENCOUNTER — Other Ambulatory Visit: Payer: Self-pay | Admitting: Nurse Practitioner

## 2016-10-16 ENCOUNTER — Encounter: Payer: Self-pay | Admitting: Nurse Practitioner

## 2016-10-16 ENCOUNTER — Ambulatory Visit (INDEPENDENT_AMBULATORY_CARE_PROVIDER_SITE_OTHER): Payer: Medicare Other | Admitting: Nurse Practitioner

## 2016-10-16 VITALS — BP 154/80 | HR 60 | Ht 71.0 in | Wt 265.8 lb

## 2016-10-16 DIAGNOSIS — I259 Chronic ischemic heart disease, unspecified: Secondary | ICD-10-CM | POA: Diagnosis not present

## 2016-10-16 NOTE — Patient Instructions (Addendum)
We will be checking the following labs today - BMET & CBC   Medication Instructions:    Continue with your current medicines.     Testing/Procedures To Be Arranged:  N/A  Follow-Up:   See me in about 4 months (You have an appointment scheduled February 18, 2017 at 9:30AM with Lawson FiscalLori).     Other Special Instructions:   N/A    If you need a refill on your cardiac medications before your next appointment, please call your pharmacy.   Call the [facility] office at [phone] if you have any questions, problems or concerns.
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
## 2016-10-17 LAB — CBC
Hematocrit: 41.6 % (ref 37.5–51.0)
Hemoglobin: 13.7 g/dL (ref 13.0–17.7)
MCH: 28.4 pg (ref 26.6–33.0)
MCHC: 32.9 g/dL (ref 31.5–35.7)
MCV: 86 fL (ref 79–97)
Platelets: 307 10*3/uL (ref 150–379)
RBC: 4.82 x10E6/uL (ref 4.14–5.80)
RDW: 14.6 % (ref 12.3–15.4)
WBC: 12.9 10*3/uL — ABNORMAL HIGH (ref 3.4–10.8)

## 2016-10-17 LAB — BASIC METABOLIC PANEL
BUN/Creatinine Ratio: 13 (ref 10–24)
BUN: 27 mg/dL (ref 8–27)
CO2: 24 mmol/L (ref 20–29)
Calcium: 9.8 mg/dL (ref 8.6–10.2)
Chloride: 101 mmol/L (ref 96–106)
Creatinine, Ser: 2.09 mg/dL — ABNORMAL HIGH (ref 0.76–1.27)
GFR calc Af Amer: 37 mL/min/{1.73_m2} — ABNORMAL LOW (ref >59)
GFR calc non Af Amer: 32 mL/min/{1.73_m2} — ABNORMAL LOW (ref >59)
Glucose: 150 mg/dL — ABNORMAL HIGH (ref 65–99)
Potassium: 4.5 mmol/L (ref 3.5–5.2)
Sodium: 142 mmol/L (ref 134–144)

## 2016-10-18 ENCOUNTER — Telehealth: Payer: Self-pay | Admitting: Nurse Practitioner

## 2016-10-18 DIAGNOSIS — Z79899 Other long term (current) drug therapy: Secondary | ICD-10-CM

## 2016-10-18 NOTE — Telephone Encounter (Signed)
New Message     Pt seen the notes in mychart, if there is anything that you need to discuss with him , give him a call back

## 2016-10-26 ENCOUNTER — Encounter: Payer: Self-pay | Admitting: *Deleted

## 2017-02-08 ENCOUNTER — Encounter: Payer: Self-pay | Admitting: Nurse Practitioner

## 2017-02-08 ENCOUNTER — Ambulatory Visit: Payer: Medicare Other | Admitting: Nurse Practitioner

## 2017-02-08 VITALS — BP 158/82 | HR 71 | Ht 71.0 in | Wt 269.0 lb

## 2017-02-08 DIAGNOSIS — I6523 Occlusion and stenosis of bilateral carotid arteries: Secondary | ICD-10-CM | POA: Diagnosis not present

## 2017-02-08 DIAGNOSIS — I5042 Chronic combined systolic (congestive) and diastolic (congestive) heart failure: Secondary | ICD-10-CM

## 2017-02-08 DIAGNOSIS — Z79899 Other long term (current) drug therapy: Secondary | ICD-10-CM

## 2017-02-08 DIAGNOSIS — I259 Chronic ischemic heart disease, unspecified: Secondary | ICD-10-CM | POA: Diagnosis not present
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
## 2017-02-08 NOTE — Patient Instructions (Addendum)
We will be checking the following labs today - NONE  Medication Instructions:    Continue with your current medicines.     Testing/Procedures To Be Arranged:  Carotid doppler in April of 2019  Follow-Up:   See me in 6 months    Other Special Instructions:   Keep a check on your BP - keep a diary and show to Dr. Manus GunningEhinger when you see him later this month  Try to get back to walking    If you need a refill on your cardiac medications before your next appointment, please call your pharmacy.   Call the [facility] office at [phone] if you have any questions, problems or concerns.

## 2017-02-18 ENCOUNTER — Ambulatory Visit: Payer: Medicare Other | Admitting: Nurse Practitioner

## 2017-05-15 ENCOUNTER — Ambulatory Visit (HOSPITAL_COMMUNITY)
Admission: RE | Admit: 2017-05-15 | Discharge: 2017-05-15 | Disposition: A | Discharge status: Home / Self Care | Payer: Medicare Other | Source: Ambulatory Visit | Attending: Cardiovascular Disease | Admitting: Cardiovascular Disease

## 2017-05-15 DIAGNOSIS — I6523 Occlusion and stenosis of bilateral carotid arteries: Secondary | ICD-10-CM | POA: Diagnosis not present

## 2017-05-17 ENCOUNTER — Telehealth: Payer: Self-pay | Admitting: Nurse Practitioner

## 2017-05-17 ENCOUNTER — Other Ambulatory Visit: Payer: Self-pay | Admitting: *Deleted

## 2017-05-17 DIAGNOSIS — I6529 Occlusion and stenosis of unspecified carotid artery: Secondary | ICD-10-CM

## 2017-05-17 NOTE — Telephone Encounter (Signed)
Spoke with pt and went over carotid results.  Pt appreciative for call.

## 2017-05-17 NOTE — Telephone Encounter (Signed)
New Message: ° ° ° ° ° ° °Pt is calling to get results. °

## 2017-08-04 ENCOUNTER — Other Ambulatory Visit: Payer: Self-pay | Admitting: Nurse Practitioner
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
## 2017-08-07 ENCOUNTER — Ambulatory Visit: Payer: Medicare Other | Admitting: Nurse Practitioner

## 2017-08-07 ENCOUNTER — Encounter: Payer: Self-pay | Admitting: Nurse Practitioner

## 2017-08-07 VITALS — BP 142/70 | HR 72 | Ht 71.0 in | Wt 275.8 lb

## 2017-08-07 DIAGNOSIS — I259 Chronic ischemic heart disease, unspecified: Secondary | ICD-10-CM | POA: Diagnosis not present

## 2017-08-07 NOTE — Patient Instructions (Addendum)
We will be checking the following labs today - NONE   Medication Instructions:    Continue with your current medicines.     Testing/Procedures To Be Arranged:  N/A  Follow-Up:   See me in 6 months.    Other Special Instructions:   N/A    If you need a refill on your cardiac medications before your next appointment, please call your pharmacy.   Call the [facility] office at [phone] if you have any questions, problems or concerns.      

## 2017-08-30 ENCOUNTER — Other Ambulatory Visit: Payer: Self-pay | Admitting: Nurse Practitioner

## 2017-10-01 ENCOUNTER — Telehealth: Payer: Self-pay | Admitting: Nurse Practitioner

## 2017-10-01 NOTE — Telephone Encounter (Signed)
New Message       Pt c/o medication issue:  1. Name of Medication: Hydralazine HCL 100mg    2. How are you currently taking this medication (dosage and times per day)3 x a day  3. Are you having a reaction (difficulty breathing--STAT)? No   4. What is your medication issue? Need a new Rx because he is running out.

## 2017-10-01 NOTE — Telephone Encounter (Signed)
Current med list has 100 mg bid listed but patient is requesting 100 mg tid. Please advise. Thanks, MI

## 2017-10-02 ENCOUNTER — Other Ambulatory Visit: Payer: Self-pay | Admitting: *Deleted

## 2017-10-02 MED ORDER — HYDRALAZINE HCL 100 MG PO TABS: 100.0000 mg | ORAL_TABLET | Freq: Two times a day (BID) | ORAL | 6 refills | Status: DC

## 2017-10-02 NOTE — Telephone Encounter (Signed)
S/w pt in regards to hydralazine, on pt's bottle states take one tablet (100 mg) by mouth bid, pt states has been taking medication tid.  Pt is happy with bp readings on tid, bp readings have been in the 120's, advised with Norma FredricksonLori Gerhardt, NP, will keep pt on tid.  Will send in today to pt's requested pharmacy and will update medication list.

## 2017-10-04 ENCOUNTER — Other Ambulatory Visit: Payer: Self-pay | Admitting: *Deleted

## 2017-10-04 MED ORDER — HYDRALAZINE HCL 100 MG PO TABS: 100.0000 mg | ORAL_TABLET | Freq: Three times a day (TID) | ORAL | 3 refills | Status: AC

## 2017-10-04 NOTE — Telephone Encounter (Signed)
Pt's medication was resent to pt's pharmacy as requested. Confirmation received.  °

## 2017-10-21 ENCOUNTER — Other Ambulatory Visit: Payer: Self-pay | Admitting: Nurse Practitioner

## 2017-10-21 MED ORDER — FUROSEMIDE 80 MG PO TABS: 80.0000 mg | ORAL_TABLET | Freq: Every day | ORAL | 3 refills | Status: AC

## 2017-10-26 ENCOUNTER — Emergency Department (HOSPITAL_COMMUNITY)
Admission: EM | Admit: 2017-10-26 | Discharge: 2017-10-26 | Disposition: A | Discharge status: Home / Self Care | Payer: Medicare Other | Attending: Emergency Medicine | Admitting: Emergency Medicine

## 2017-10-26 ENCOUNTER — Emergency Department (HOSPITAL_BASED_OUTPATIENT_CLINIC_OR_DEPARTMENT_OTHER): Payer: Medicare Other

## 2017-10-26 ENCOUNTER — Other Ambulatory Visit: Payer: Self-pay

## 2017-10-26 ENCOUNTER — Encounter (HOSPITAL_COMMUNITY): Payer: Self-pay | Admitting: Emergency Medicine

## 2017-10-26 ENCOUNTER — Emergency Department (HOSPITAL_COMMUNITY): Payer: Medicare Other

## 2017-10-26 DIAGNOSIS — Z79899 Other long term (current) drug therapy: Secondary | ICD-10-CM | POA: Diagnosis not present

## 2017-10-26 DIAGNOSIS — Z794 Long term (current) use of insulin: Secondary | ICD-10-CM | POA: Insufficient documentation

## 2017-10-26 DIAGNOSIS — I110 Hypertensive heart disease with heart failure: Secondary | ICD-10-CM | POA: Diagnosis not present

## 2017-10-26 DIAGNOSIS — I6522 Occlusion and stenosis of left carotid artery: Secondary | ICD-10-CM | POA: Insufficient documentation

## 2017-10-26 DIAGNOSIS — Z951 Presence of aortocoronary bypass graft: Secondary | ICD-10-CM | POA: Diagnosis not present

## 2017-10-26 DIAGNOSIS — R531 Weakness: Secondary | ICD-10-CM

## 2017-10-26 DIAGNOSIS — I5042 Chronic combined systolic (congestive) and diastolic (congestive) heart failure: Secondary | ICD-10-CM | POA: Diagnosis not present

## 2017-10-26 DIAGNOSIS — E119 Type 2 diabetes mellitus without complications: Secondary | ICD-10-CM | POA: Insufficient documentation

## 2017-10-26 DIAGNOSIS — R5383 Other fatigue: Secondary | ICD-10-CM | POA: Diagnosis present

## 2017-10-26 DIAGNOSIS — I2510 Atherosclerotic heart disease of native coronary artery without angina pectoris: Secondary | ICD-10-CM | POA: Insufficient documentation

## 2017-10-26 DIAGNOSIS — I6523 Occlusion and stenosis of bilateral carotid arteries: Secondary | ICD-10-CM | POA: Diagnosis not present

## 2017-10-26 LAB — BASIC METABOLIC PANEL
ANION GAP: 14 (ref 5–15)
BUN: 33 mg/dL — ABNORMAL HIGH (ref 8–23)
CO2: 24 mmol/L (ref 22–32)
Calcium: 8.7 mg/dL — ABNORMAL LOW (ref 8.9–10.3)
Chloride: 100 mmol/L (ref 98–111)
Creatinine, Ser: 2.32 mg/dL — ABNORMAL HIGH (ref 0.61–1.24)
GFR, EST AFRICAN AMERICAN: 32 mL/min — AB (ref >60)
GFR, EST NON AFRICAN AMERICAN: 27 mL/min — AB (ref >60)
GLUCOSE: 337 mg/dL — AB (ref 70–99)
POTASSIUM: 4.3 mmol/L (ref 3.5–5.1)
Sodium: 138 mmol/L (ref 135–145)

## 2017-10-26 LAB — CBC
HCT: 42.8 % (ref 39.0–52.0)
HEMOGLOBIN: 12.7 g/dL — AB (ref 13.0–17.0)
MCH: 27.5 pg (ref 26.0–34.0)
MCHC: 29.7 g/dL — ABNORMAL LOW (ref 30.0–36.0)
MCV: 92.6 fL (ref 78.0–100.0)
PLATELETS: 271 10*3/uL (ref 150–400)
RBC: 4.62 MIL/uL (ref 4.22–5.81)
RDW: 14.8 % (ref 11.5–15.5)
WBC: 11.8 10*3/uL — AB (ref 4.0–10.5)

## 2017-10-26 LAB — I-STAT TROPONIN, ED: Troponin i, poc: 0 ng/mL (ref 0.00–0.08)

## 2017-10-26 LAB — I-STAT CG4 LACTIC ACID, ED: LACTIC ACID, VENOUS: 1.76 mmol/L (ref 0.5–1.9)

## 2017-10-26 LAB — BRAIN NATRIURETIC PEPTIDE: B NATRIURETIC PEPTIDE 5: 98 pg/mL (ref 0.0–100.0)

## 2017-10-26 NOTE — Progress Notes (Addendum)
VASCULAR LAB PRELIMINARY  PRELIMINARY  PRELIMINARY  PRELIMINARY  Carotid Duplex completed.    Preliminary report: Right 40-59% ICA stenosis. The left ICA velocites have increased from 349/84 in April 2019  to 412/130, now indicating a 80-99% left ICA stenosis. Vertebral artery flow is antegrade.  Bilateral brachial, radial, and ulnar waveforms are triphasic.  Called results to Dr. Abbey ChattersButler  Fabiana Dromgoole, [facility] Surgical Center IncCANDACE, RVT 10/26/2017, 6:03 PM

## 2017-10-26 NOTE — ED Notes (Signed)
Pt present different values of Blood pressure on each arm; Right Arm BP: 143/68 and Left Arm:119/43,
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
## 2017-10-26 NOTE — ED Triage Notes (Addendum)
Pt states he was walking on his treadmill and began feeling weak. He sat down and checked his BP and on the left arm he was 80s systolic and right arm was normal. Denies chest pain, denies shortness of breath, denies back or abdominal pain. MAP in rt arm is 91, MAP in lft arm is 66. Pt also would like staff to know [place]. Denies blood in stool. No neurological deficits.

## 2017-10-26 NOTE — Discharge Instructions (Signed)
Your evaluated in the emergency department for onset of fatigue and weakness after exerting herself.  You also had some difference in the blood pressure between the 2 arms.  You had blood work EKG and chest x-ray along with a vascular ultrasound.  The most significant findings were that your carotid artery on the left was more advanced than prior studies.  This may be the cause of your symptoms and will definitely need to be followed up with you and your cardiologist.  Please do not over exert yourself in the meantime and continue your regular medicines.  Call your cardiologist on Monday and if your symptoms recur or any other concerns please return to the emergency department.

## 2017-10-28 ENCOUNTER — Telehealth: Payer: Self-pay | Admitting: *Deleted

## 2017-10-28 ENCOUNTER — Other Ambulatory Visit: Payer: Self-pay | Admitting: *Deleted

## 2017-10-28 DIAGNOSIS — I6522 Occlusion and stenosis of left carotid artery: Secondary | ICD-10-CM

## 2017-10-28 NOTE — Telephone Encounter (Signed)
Pt has appointment for [facility] on 9/24.  [place]

## 2017-10-28 NOTE — Telephone Encounter (Signed)
S/w pt's wife per [facility]) is aware [facility] will be calling pt to schedule an appointment for critical L carotid stenosis.  S.w Jan errington at [facility] and will call pt with appt time and date.  Pt did call and schedule appt with Lawson FiscalLori on Wednesday, 9/25.  Pt will keep this appt. For surgical clearance.

## 2017-10-28 NOTE — Telephone Encounter (Signed)
An order was placed to for a critical referral to [facility] for L carotid stenosis.  88-99 % blockage.  S/w Uvaldo RisingSharon Feurgson and will call to office to get pt an appointment.

## 2017-10-29 ENCOUNTER — Ambulatory Visit (INDEPENDENT_AMBULATORY_CARE_PROVIDER_SITE_OTHER): Payer: Medicare Other | Admitting: Vascular Surgery

## 2017-10-29 ENCOUNTER — Other Ambulatory Visit: Payer: Self-pay | Admitting: *Deleted

## 2017-10-29 ENCOUNTER — Encounter: Payer: Self-pay | Admitting: *Deleted

## 2017-10-29 ENCOUNTER — Other Ambulatory Visit: Payer: Self-pay

## 2017-10-29 ENCOUNTER — Encounter: Payer: Self-pay | Admitting: Vascular Surgery

## 2017-10-29 VITALS — BP 130/66 | HR 60 | Temp 97.7°F | Resp 18 | Ht 71.0 in | Wt 276.1 lb

## 2017-10-29 DIAGNOSIS — I6523 Occlusion and stenosis of bilateral carotid arteries: Secondary | ICD-10-CM
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
## 2017-10-30 ENCOUNTER — Encounter: Payer: Self-pay | Admitting: Nurse Practitioner

## 2017-10-30 ENCOUNTER — Telehealth: Payer: Self-pay

## 2017-10-30 ENCOUNTER — Ambulatory Visit: Payer: Medicare Other | Admitting: Nurse Practitioner

## 2017-10-30 VITALS — BP 146/70 | Ht 71.0 in | Wt 277.0 lb

## 2017-10-30 DIAGNOSIS — I6522 Occlusion and stenosis of left carotid artery: Secondary | ICD-10-CM

## 2017-10-30 DIAGNOSIS — Z01818 Encounter for other preprocedural examination: Secondary | ICD-10-CM
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
## 2017-10-30 NOTE — Patient Instructions (Addendum)
We will be checking the following labs today - NONE   Medication Instructions:    Continue with your current medicines.     Testing/Procedures To Be Arranged:  N/A  Follow-Up:   See me as planned.      Other Special Instructions:   I will send a note to Dr. Kemper Durie    If you need a refill on your cardiac medications before your next appointment, please call your pharmacy.   Call the [facility] office at [phone] if you have any questions, problems or concerns.

## 2017-10-30 NOTE — Telephone Encounter (Signed)
   [facility] Pre-operative Risk Assessment    Request for surgical clearance:  1. What type of surgery is being performed?  Carotid Endartectomy   2. When is this surgery scheduled? 11/18/17   3. What type of clearance is required (medical clearance vs. Pharmacy clearance to hold med vs. Both)? Medical  4. Are there any medications that need to be held prior to surgery and how long?None   5. Practice name and name of physician performing surgery? [facility]; Dr Monica Martinez   What is your office phone number: P [phone]   6. 7.   What is your office fax number F [phone]   8.   Anesthesia type (None, local, MAC, general) ? General   Justin Bartlett 10/30/2017, 10:25 AM  _________________________________________________________________   (provider comments below)

## 2017-11-11 NOTE — Pre-Procedure Instructions (Signed)
Justin Bartlett  11/11/2017      [facility] DRUG STORE #[id number] Justin Bartlett, Justin Bartlett - [address] AT [facility] OF [facility] & [address] [place] [address] Phone: [phone] Fax: [phone]    Your procedure is scheduled on October 14th.  Report to [facility] Admitting at 0530 A.M.  Call this number if you have problems the morning of surgery:  [phone]   Remember:  Do not eat or drink after midnight.    Take these medicines the morning of surgery with A SIP OF WATER   Atorvastatin  Colace  Hydralazine  Imdur  Metoprolol  Zanaflex (if needed)  7 days prior to surgery STOP taking any Aspirin(unless otherwise instructed by your surgeon), Aleve, Naproxen, Ibuprofen, Motrin, Advil, Goody's, BC's, all herbal medications, fish oil, and all vitamins    WHAT DO I DO ABOUT MY DIABETES MEDICATION?   Marland Kitchen Do not take oral diabetes medicines (pills) the morning of surgery.  . THE NIGHT BEFORE SURGERY, take ___________ units of ___________insulin.       . THE MORNING OF SURGERY, take _____________ units of __________insulin.  . The day of surgery, do not take other diabetes injectables, including Byetta (exenatide), Bydureon (exenatide ER), Victoza (liraglutide), or Trulicity (dulaglutide).  . If your CBG is greater than 220 mg/dL, you may take  of your sliding scale (correction) dose of insulin.   How to Manage Your Diabetes Before and After Surgery  Why is it important to control my blood sugar before and after surgery? . Improving blood sugar levels before and after surgery helps healing and can limit problems. . A way of improving blood sugar control is eating a healthy diet by: o  Eating less sugar and carbohydrates o  Increasing activity/exercise o  Talking with your doctor about reaching your blood sugar goals . High blood sugars (greater than 180 mg/dL) can raise your risk of infections and slow your recovery, so you  will need to focus on controlling your diabetes during the weeks before surgery. . Make sure that the doctor who takes care of your diabetes knows about your planned surgery including the date and location.  How do I manage my blood sugar before surgery? . Check your blood sugar at least 4 times a day, starting 2 days before surgery, to make sure that the level is not too high or low. o Check your blood sugar the morning of your surgery when you wake up and every 2 hours until you get to the Short Stay unit. . If your blood sugar is less than 70 mg/dL, you will need to treat for low blood sugar: o Do not take insulin. o Treat a low blood sugar (less than 70 mg/dL) with  cup of clear juice (cranberry or apple), 4 glucose tablets, OR glucose gel. o Recheck blood sugar in 15 minutes after treatment (to make sure it is greater than 70 mg/dL). If your blood sugar is not greater than 70 mg/dL on recheck, call [phone] for further instructions. . Report your blood sugar to the short stay nurse when you get to Short Stay.  . If you are admitted to the hospital after surgery: o Your blood sugar will be checked by the staff and you will probably be given insulin after surgery (instead of oral diabetes medicines) to make sure you have good blood sugar levels. o The goal for blood sugar control after surgery is 80-180  mg/dL.     Do not wear jewelry.  Do not wear lotions, powders, or cologes, or deodorant.  Do not shave 48 hours prior to surgery.  Men may shave face and neck.  Do not bring valuables to the hospital.  [facility] is not responsible for any belongings or valuables.  Contacts, dentures or bridgework may not be worn into surgery.  Leave your suitcase in the car.  After surgery it may be brought to your room.  For patients admitted to the hospital, discharge time will be determined by your treatment team.  Patients discharged the day of surgery will not be allowed to drive home.     [facility]- Preparing For Surgery  Before surgery, you can play an important role. Because skin is not sterile, your skin needs to be as free of germs as possible. You can reduce the number of germs on your skin by washing with CHG (chlorahexidine gluconate) Soap before surgery.  CHG is an antiseptic cleaner which kills germs and bonds with the skin to continue killing germs even after washing.    Oral Hygiene is also important to reduce your risk of infection.  Remember - BRUSH YOUR TEETH THE MORNING OF SURGERY WITH YOUR REGULAR TOOTHPASTE  Please do not use if you have an allergy to CHG or antibacterial soaps. If your skin becomes reddened/irritated stop using the CHG.  Do not shave (including legs and underarms) for at least 48 hours prior to first CHG shower. It is OK to shave your face.  Please follow these instructions carefully.   1. Shower the NIGHT BEFORE SURGERY and the MORNING OF SURGERY with CHG.   2. If you chose to wash your hair, wash your hair first as usual with your normal shampoo.  3. After you shampoo, rinse your hair and body thoroughly to remove the shampoo.  4. Use CHG as you would any other liquid soap. You can apply CHG directly to the skin and wash gently with a scrungie or a clean washcloth.   5. Apply the CHG Soap to your body ONLY FROM THE NECK DOWN.  Do not use on open wounds or open sores. Avoid contact with your eyes, ears, mouth and genitals (private parts). Wash Face and genitals (private parts)  with your normal soap.  6. Wash thoroughly, paying special attention to the area where your surgery will be performed.  7. Thoroughly rinse your body with warm water from the neck down.  8. DO NOT shower/wash with your normal soap after using and rinsing off the CHG Soap.  9. Pat yourself dry with a CLEAN TOWEL.  10. Wear CLEAN PAJAMAS to bed the night before surgery, wear comfortable clothes the morning of surgery  11. Place CLEAN SHEETS on your bed  the night of your first shower and DO NOT SLEEP WITH PETS.    Day of Surgery:  Do not apply any deodorants/lotions.  Please wear clean clothes to the hospital/surgery center.   Remember to brush your teeth WITH YOUR REGULAR TOOTHPASTE.    Please read over the following fact sheets that you were given.

## 2017-11-12 ENCOUNTER — Encounter (HOSPITAL_COMMUNITY)
Admission: RE | Admit: 2017-11-12 | Discharge: 2017-11-12 | Disposition: A | Discharge status: Home / Self Care | Payer: Medicare Other | Source: Ambulatory Visit | Attending: Vascular Surgery | Admitting: Vascular Surgery

## 2017-11-12 ENCOUNTER — Encounter (HOSPITAL_COMMUNITY): Payer: Self-pay

## 2017-11-12 ENCOUNTER — Other Ambulatory Visit: Payer: Self-pay

## 2017-11-12 DIAGNOSIS — Z79899 Other long term (current) drug therapy: Secondary | ICD-10-CM | POA: Diagnosis not present

## 2017-11-12 DIAGNOSIS — I6522 Occlusion and stenosis of left carotid artery: Secondary | ICD-10-CM | POA: Insufficient documentation

## 2017-11-12 DIAGNOSIS — Z794 Long term (current) use of insulin: Secondary | ICD-10-CM | POA: Diagnosis not present

## 2017-11-12 DIAGNOSIS — E119 Type 2 diabetes mellitus without complications: Secondary | ICD-10-CM | POA: Insufficient documentation

## 2017-11-12 DIAGNOSIS — Z7982 Long term (current) use of aspirin: Secondary | ICD-10-CM | POA: Insufficient documentation

## 2017-11-12 DIAGNOSIS — Z01818 Encounter for other preprocedural examination: Secondary | ICD-10-CM | POA: Diagnosis not present

## 2017-11-12 LAB — COMPREHENSIVE METABOLIC PANEL
ALT: 25 U/L (ref 0–44)
AST: 25 U/L (ref 15–41)
Albumin: 3.1 g/dL — ABNORMAL LOW (ref 3.5–5.0)
Alkaline Phosphatase: 97 U/L (ref 38–126)
Anion gap: 8 (ref 5–15)
BILIRUBIN TOTAL: 0.5 mg/dL (ref 0.3–1.2)
BUN: 31 mg/dL — AB (ref 8–23)
CO2: 24 mmol/L (ref 22–32)
Calcium: 8.9 mg/dL (ref 8.9–10.3)
Chloride: 110 mmol/L (ref 98–111)
Creatinine, Ser: 2.21 mg/dL — ABNORMAL HIGH (ref 0.61–1.24)
GFR calc Af Amer: 34 mL/min — ABNORMAL LOW (ref >60)
GFR, EST NON AFRICAN AMERICAN: 29 mL/min — AB (ref >60)
Glucose, Bld: 62 mg/dL — ABNORMAL LOW (ref 70–99)
Potassium: 4.2 mmol/L (ref 3.5–5.1)
Sodium: 142 mmol/L (ref 135–145)
TOTAL PROTEIN: 6.8 g/dL (ref 6.5–8.1)

## 2017-11-12 LAB — URINALYSIS, ROUTINE W REFLEX MICROSCOPIC
BILIRUBIN URINE: NEGATIVE
Glucose, UA: NEGATIVE mg/dL
Hgb urine dipstick: NEGATIVE
KETONES UR: NEGATIVE mg/dL
LEUKOCYTES UA: NEGATIVE
NITRITE: NEGATIVE
PH: 5 (ref 5.0–8.0)
Protein, ur: 30 mg/dL — AB
Specific Gravity, Urine: 1.017 (ref 1.005–1.030)

## 2017-11-12 LAB — CBC
HEMATOCRIT: 43.5 % (ref 39.0–52.0)
Hemoglobin: 12.8 g/dL — ABNORMAL LOW (ref 13.0–17.0)
MCH: 27.3 pg (ref 26.0–34.0)
MCHC: 29.4 g/dL — ABNORMAL LOW (ref 30.0–36.0)
MCV: 92.8 fL (ref 80.0–100.0)
NRBC: 0 % (ref 0.0–0.2)
Platelets: 251 10*3/uL (ref 150–400)
RBC: 4.69 MIL/uL (ref 4.22–5.81)
RDW: 15 % (ref 11.5–15.5)
WBC: 10.8 10*3/uL — ABNORMAL HIGH (ref 4.0–10.5)

## 2017-11-12 LAB — HEMOGLOBIN A1C
HEMOGLOBIN A1C: 7 % — AB (ref 4.8–5.6)
Mean Plasma Glucose: 154.2 mg/dL

## 2017-11-12 LAB — GLUCOSE, CAPILLARY: Glucose-Capillary: 70 mg/dL (ref 70–99)

## 2017-11-12 LAB — TYPE AND SCREEN
ABO/RH(D): O POS
Antibody Screen: NEGATIVE

## 2017-11-12 LAB — PROTIME-INR
INR: 1.08
PROTHROMBIN TIME: 14 s (ref 11.4–15.2)

## 2017-11-12 LAB — SURGICAL PCR SCREEN
MRSA, PCR: NEGATIVE
Staphylococcus aureus: NEGATIVE

## 2017-11-12 LAB — APTT: aPTT: 29 seconds (ref 24–36)

## 2017-11-12 NOTE — Progress Notes (Signed)
PCP - Blair Heys Cardiologist - Norma Fredrickson- pt received cardiac clearance. Per pt, no need to do stress test prior to surgery per MD.  Endocrinologist- Dr. Talmage Coin- last office note and Hgb A1c requested Nephrologist- Dr. Elenore Rota- see notes in careeverywhere  Chest x-ray - 10/26/17 EKG - 10/30/17 Stress Test - denies ECHO - 2016 Cardiac Cath - 06/21/10 in notes section  Sleep Study- no hx OSA or sleep study, stop bang +, results sent to PCP   Fasting Blood Sugar - typically 115-120 per patient. Pt states he does not eat consistently and his CBG will drop. Blood sugar 70 today at PAT. Pt has not eaten since breakfast, where he had a protein bar. Pt given some juice at PAT appointment per request. Pt takes U500 insulin- pt instructed to call Dr. Sharl Ma and ask how to manage his U500 insulin the day before and day of surgery as he will not be eating after midnight the night before surgery. Last A1c 7.3 on 09/30/17- wife was able to pull this record up in the [facility]. Records requested from Dr. Sharl Ma  Aspirin Instructions: Per Dr. Chestine Spore pt is to keep taking Aggrenox  Anesthesia review: hx CAD, CABG, U500 insulin, EKG.   Patient denies shortness of breath, fever, cough and chest pain at PAT appointment   Patient verbalized understanding of instructions that were given to them at the PAT appointment. Patient was also instructed that they will need to review over the PAT instructions again at home before surgery.

## 2017-11-12 NOTE — Progress Notes (Signed)
   11/12/17 1409  OBSTRUCTIVE SLEEP APNEA  Have you ever been diagnosed with sleep apnea through a sleep study? No  Do you snore loudly (loud enough to be heard through closed doors)?  0  Do you often feel tired, fatigued, or sleepy during the daytime (such as falling asleep during driving or talking to someone)? 0  Has anyone observed you stop breathing during your sleep? 0  Do you have, or are you being treated for high blood pressure? 1  BMI more than 35 kg/m2? 1  Age > 50 (1-yes) 1  Neck circumference greater than:Male 16 inches or larger, Male 17inches or larger? 1  Male Gender (Yes=1) 1  Obstructive Sleep Apnea Score 5  Score 5 or greater  Results sent to PCP

## 2017-11-12 NOTE — Pre-Procedure Instructions (Signed)
KEYSHAUN Bartlett  11/12/2017      [facility] DRUG STORE #[id number] [name], [place] - [address] AT [facility] OF [facility] & [address] [place] [address] Phone: [phone] Fax: [phone]    Your procedure is scheduled on October 14th.  Report to [facility] Admitting at 0530 A.M.  Call this number if you have problems the morning of surgery:  [phone]   Remember:  Do not eat or drink after midnight.    Take these medicines the morning of surgery with A SIP OF WATER    Atorvastatin (lipitor  Hydralazine (apresoline)  Imdur (isosorbide)  Metoprolol (lopressor)  Zanaflex (if needed)  DO NOT Take any diabetic pills the morning of surgery  CONTACT your endocrinologist (Diabetic doctor) regarding what to do with your Humulin R- U-500 Insulin the day before and day of surgery.  7 days prior to surgery STOP taking any Aleve, Naproxen, Ibuprofen, Motrin, Advil, Goody's, BC's, all herbal medications, fish oil, and all vitamins  FOLLOW your surgeon's instructions on stopping Aggrenox (dipyridamole-aspirin). If no instructions were given, please call your surgeon's office.      How to Manage Your Diabetes Before and After Surgery  Why is it important to control my blood sugar before and after surgery? . Improving blood sugar levels before and after surgery helps healing and can limit problems. . A way of improving blood sugar control is eating a healthy diet by: o  Eating less sugar and carbohydrates o  Increasing activity/exercise o  Talking with your doctor about reaching your blood sugar goals . High blood sugars (greater than 180 mg/dL) can raise your risk of infections and slow your recovery, so you will need to focus on controlling your diabetes during the weeks before surgery. . Make sure that the doctor who takes care of your diabetes knows about your planned surgery including the date and location.  How do I manage my  blood sugar before surgery? . Check your blood sugar at least 4 times a day, starting 2 days before surgery, to make sure that the level is not too high or low. o Check your blood sugar the morning of your surgery when you wake up and every 2 hours until you get to the Short Stay unit. . If your blood sugar is less than 70 mg/dL, you will need to treat for low blood sugar: o Do not take insulin. o Treat a low blood sugar (less than 70 mg/dL) with  cup of clear juice (cranberry or apple), 4 glucose tablets, OR glucose gel. o Recheck blood sugar in 15 minutes after treatment (to make sure it is greater than 70 mg/dL). If your blood sugar is not greater than 70 mg/dL on recheck, call [phone] for further instructions. . Report your blood sugar to the short stay nurse when you get to Short Stay.  . If you are admitted to the hospital after surgery: o Your blood sugar will be checked by the staff and you will probably be given insulin after surgery (instead of oral diabetes medicines) to make sure you have good blood sugar levels. o The goal for blood sugar control after surgery is 80-180 mg/dL.     Do not wear jewelry.  Do not wear lotions, powders, or cologes, or deodorant.  Do not shave 48 hours prior to surgery.  Men may shave face and neck.  Do not bring valuables to the hospital.  [facility] is not responsible for any belongings or valuables.  Contacts, dentures or bridgework may not be worn into surgery.  Leave your suitcase in the car.  After surgery it may be brought to your room.  For patients admitted to the hospital, discharge time will be determined by your treatment team.  Patients discharged the day of surgery will not be allowed to drive home.    [facility]- Preparing For Surgery  Before surgery, you can play an important role. Because skin is not sterile, your skin needs to be as free of germs as possible. You can reduce the number of germs on your skin by washing with  CHG (chlorahexidine gluconate) Soap before surgery.  CHG is an antiseptic cleaner which kills germs and bonds with the skin to continue killing germs even after washing.    Oral Hygiene is also important to reduce your risk of infection.  Remember - BRUSH YOUR TEETH THE MORNING OF SURGERY WITH YOUR REGULAR TOOTHPASTE  Please do not use if you have an allergy to CHG or antibacterial soaps. If your skin becomes reddened/irritated stop using the CHG.  Do not shave (including legs and underarms) for at least 48 hours prior to first CHG shower. It is OK to shave your face.  Please follow these instructions carefully.   1. Shower the NIGHT BEFORE SURGERY and the MORNING OF SURGERY with CHG.   2. If you chose to wash your hair, wash your hair first as usual with your normal shampoo.  3. After you shampoo, rinse your hair and body thoroughly to remove the shampoo.  4. Use CHG as you would any other liquid soap. You can apply CHG directly to the skin and wash gently with a scrungie or a clean washcloth.   5. Apply the CHG Soap to your body ONLY FROM THE NECK DOWN.  Do not use on open wounds or open sores. Avoid contact with your eyes, ears, mouth and genitals (private parts). Wash Face and genitals (private parts)  with your normal soap.  6. Wash thoroughly, paying special attention to the area where your surgery will be performed.  7. Thoroughly rinse your body with warm water from the neck down.  8. DO NOT shower/wash with your normal soap after using and rinsing off the CHG Soap.  9. Pat yourself dry with a CLEAN TOWEL.  10. Wear CLEAN PAJAMAS to bed the night before surgery, wear comfortable clothes the morning of surgery  11. Place CLEAN SHEETS on your bed the night of your first shower and DO NOT SLEEP WITH PETS.    Day of Surgery:  Do not apply any deodorants/lotions.  Please wear clean clothes to the hospital/surgery center.   Remember to brush your teeth WITH YOUR REGULAR  TOOTHPASTE.    Please read over the following fact sheets that you were given.

## 2017-11-12 NOTE — Progress Notes (Signed)
Inboxed MD about urinalysis showing rare bacteria.
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
## 2017-11-15 MED ORDER — DEXTROSE 5 % IV SOLN
3.0000 g | INTRAVENOUS | Status: AC
  Administered 2017-11-18: 3 g via INTRAVENOUS
  Filled 2017-11-15: qty 3

## 2017-11-17 NOTE — Anesthesia Preprocedure Evaluation (Addendum)
Anesthesia Evaluation  Patient identified by MRN, date of birth, ID band Patient awake    Reviewed: Allergy & Precautions, NPO status , Patient's Chart, lab work & pertinent test results, reviewed documented beta blocker date and time   Airway Mallampati: II  TM Distance: >3 FB Neck ROM: Full    Dental  (+) Dental Advisory Given   Pulmonary neg pulmonary ROS,    breath sounds clear to auscultation       Cardiovascular hypertension, Pt. on medications and Pt. on home beta blockers + CAD, + CABG and +CHF   Rhythm:Regular Rate:Normal  2016: Left ventricle: The cavity size was normal. Wall thickness wasincreased in a pattern of mild LVH. Systolic function was normal.The estimated ejection fraction was in the range of 55% to 60%.Features are consistent with a pseudonormal left ventricularfilling pattern, with concomitant abnormal relaxation andincreased filling pressure (grade 2 diastolic dysfunction). - Mitral valve: There was mild regurgitation. - Left atrium: The atrium was mildly dilated.   Neuro/Psych CVA    GI/Hepatic negative GI ROS, Neg liver ROS,   Endo/Other  diabetes, Type 2, Insulin DependentMorbid obesity  Renal/GU CRFRenal disease     Musculoskeletal  (+) Arthritis ,   Abdominal   Peds  Hematology  (+) Blood dyscrasia (on aggrenox), anemia ,   Anesthesia Other Findings   Reproductive/Obstetrics                           Lab Results  Component Value Date   WBC 10.8 (H) 11/12/2017   HGB 12.8 (L) 11/12/2017   HCT 43.5 11/12/2017   MCV 92.8 11/12/2017   PLT 251 11/12/2017   Lab Results  Component Value Date   CREATININE 2.21 (H) 11/12/2017   BUN 31 (H) 11/12/2017   NA 142 11/12/2017   K 4.2 11/12/2017   CL 110 11/12/2017   CO2 24 11/12/2017    Anesthesia Physical Anesthesia Plan  ASA: IV  Anesthesia Plan: General   Post-op Pain Management:    Induction:  Intravenous  PONV Risk Score and Plan: 2 and Dexamethasone, Ondansetron and Treatment may vary due to age or medical condition  Airway Management Planned: Oral ETT  Additional Equipment: Arterial line  Intra-op Plan:   Post-operative Plan: Extubation in OR  Informed Consent: I have reviewed the patients History and Physical, chart, labs and discussed the procedure including the risks, benefits and alternatives for the proposed anesthesia with the patient or authorized representative who has indicated his/her understanding and acceptance.   Dental advisory given  Plan Discussed with: CRNA  Anesthesia Plan Comments:        Anesthesia Quick Evaluation

## 2017-11-18 ENCOUNTER — Inpatient Hospital Stay (HOSPITAL_COMMUNITY): Payer: Medicare Other | Admitting: Anesthesiology

## 2017-11-18 ENCOUNTER — Encounter (HOSPITAL_COMMUNITY): Payer: Self-pay | Admitting: *Deleted

## 2017-11-18 ENCOUNTER — Telehealth: Payer: Self-pay | Admitting: Vascular Surgery

## 2017-11-18 ENCOUNTER — Other Ambulatory Visit: Payer: Self-pay

## 2017-11-18 ENCOUNTER — Inpatient Hospital Stay (HOSPITAL_COMMUNITY): Payer: Medicare Other | Admitting: Physician Assistant

## 2017-11-18 ENCOUNTER — Encounter (HOSPITAL_COMMUNITY)
Admission: RE | Disposition: A | Discharge status: Home / Self Care | Payer: Self-pay | Source: Home / Self Care | Attending: Vascular Surgery

## 2017-11-18 ENCOUNTER — Inpatient Hospital Stay (HOSPITAL_COMMUNITY)
Admission: RE | Admit: 2017-11-18 | Discharge: 2017-11-22 | DRG: 038 | Disposition: A | Discharge status: Home / Self Care | Payer: Medicare Other | Attending: Vascular Surgery | Admitting: Vascular Surgery

## 2017-11-18 DIAGNOSIS — R4702 Dysphasia: Secondary | ICD-10-CM | POA: Diagnosis not present

## 2017-11-18 DIAGNOSIS — G9341 Metabolic encephalopathy: Secondary | ICD-10-CM | POA: Diagnosis not present

## 2017-11-18 DIAGNOSIS — Z79899 Other long term (current) drug therapy: Secondary | ICD-10-CM

## 2017-11-18 DIAGNOSIS — R9082 White matter disease, unspecified: Secondary | ICD-10-CM | POA: Diagnosis present

## 2017-11-18 DIAGNOSIS — Z833 Family history of diabetes mellitus: Secondary | ICD-10-CM

## 2017-11-18 DIAGNOSIS — R0602 Shortness of breath: Secondary | ICD-10-CM | POA: Diagnosis not present

## 2017-11-18 DIAGNOSIS — I130 Hypertensive heart and chronic kidney disease with heart failure and stage 1 through stage 4 chronic kidney disease, or unspecified chronic kidney disease: Secondary | ICD-10-CM | POA: Diagnosis present

## 2017-11-18 DIAGNOSIS — E669 Obesity, unspecified: Secondary | ICD-10-CM | POA: Diagnosis present

## 2017-11-18 DIAGNOSIS — Z951 Presence of aortocoronary bypass graft: Secondary | ICD-10-CM | POA: Diagnosis not present

## 2017-11-18 DIAGNOSIS — I2510 Atherosclerotic heart disease of native coronary artery without angina pectoris: Secondary | ICD-10-CM | POA: Diagnosis present

## 2017-11-18 DIAGNOSIS — I252 Old myocardial infarction: Secondary | ICD-10-CM | POA: Diagnosis not present

## 2017-11-18 DIAGNOSIS — M1990 Unspecified osteoarthritis, unspecified site: Secondary | ICD-10-CM | POA: Diagnosis present

## 2017-11-18 DIAGNOSIS — Z794 Long term (current) use of insulin: Secondary | ICD-10-CM | POA: Diagnosis not present

## 2017-11-18 DIAGNOSIS — Z8673 Personal history of transient ischemic attack (TIA), and cerebral infarction without residual deficits: Secondary | ICD-10-CM

## 2017-11-18 DIAGNOSIS — T17908A Unspecified foreign body in respiratory tract, part unspecified causing other injury, initial encounter: Secondary | ICD-10-CM | POA: Diagnosis not present

## 2017-11-18 DIAGNOSIS — Z6836 Body mass index (BMI) 36.0-36.9, adult: Secondary | ICD-10-CM | POA: Diagnosis not present

## 2017-11-18 DIAGNOSIS — E7800 Pure hypercholesterolemia, unspecified: Secondary | ICD-10-CM | POA: Diagnosis present

## 2017-11-18 DIAGNOSIS — J9601 Acute respiratory failure with hypoxia: Secondary | ICD-10-CM | POA: Diagnosis not present

## 2017-11-18 DIAGNOSIS — E1122 Type 2 diabetes mellitus with diabetic chronic kidney disease: Secondary | ICD-10-CM | POA: Diagnosis present

## 2017-11-18 DIAGNOSIS — N179 Acute kidney failure, unspecified: Secondary | ICD-10-CM | POA: Diagnosis not present

## 2017-11-18 DIAGNOSIS — I6529 Occlusion and stenosis of unspecified carotid artery: Secondary | ICD-10-CM | POA: Diagnosis present

## 2017-11-18 DIAGNOSIS — I6522 Occlusion and stenosis of left carotid artery: Secondary | ICD-10-CM | POA: Diagnosis present

## 2017-11-18 DIAGNOSIS — X58XXXA Exposure to other specified factors, initial encounter: Secondary | ICD-10-CM | POA: Diagnosis not present

## 2017-11-18 DIAGNOSIS — Z8249 Family history of ischemic heart disease and other diseases of the circulatory system: Secondary | ICD-10-CM | POA: Diagnosis not present

## 2017-11-18 DIAGNOSIS — Z96642 Presence of left artificial hip joint: Secondary | ICD-10-CM | POA: Diagnosis present

## 2017-11-18 DIAGNOSIS — Z888 Allergy status to other drugs, medicaments and biological substances status: Secondary | ICD-10-CM | POA: Diagnosis not present

## 2017-11-18 DIAGNOSIS — I7389 Other specified peripheral vascular diseases: Secondary | ICD-10-CM | POA: Diagnosis present

## 2017-11-18 DIAGNOSIS — J9811 Atelectasis: Secondary | ICD-10-CM | POA: Diagnosis not present

## 2017-11-18 DIAGNOSIS — N184 Chronic kidney disease, stage 4 (severe): Secondary | ICD-10-CM | POA: Diagnosis present

## 2017-11-18 DIAGNOSIS — Y92239 Unspecified place in hospital as the place of occurrence of the external cause: Secondary | ICD-10-CM | POA: Diagnosis not present

## 2017-11-18 DIAGNOSIS — R531 Weakness: Secondary | ICD-10-CM | POA: Diagnosis not present

## 2017-11-18 DIAGNOSIS — R278 Other lack of coordination: Secondary | ICD-10-CM | POA: Diagnosis not present

## 2017-11-18 HISTORY — DX: Chronic kidney disease, stage 4 (severe): N18.4

## 2017-11-18 HISTORY — DX: Type 2 diabetes mellitus without complications: E11.9

## 2017-11-18 HISTORY — DX: Non-ST elevation (NSTEMI) myocardial infarction: I21.4

## 2017-11-18 HISTORY — PX: ENDARTERECTOMY: SHX5162

## 2017-11-18 HISTORY — PX: PATCH ANGIOPLASTY: SHX6230

## 2017-11-18 LAB — GLUCOSE, CAPILLARY
GLUCOSE-CAPILLARY: 233 mg/dL — AB (ref 70–99)
GLUCOSE-CAPILLARY: 238 mg/dL — AB (ref 70–99)
GLUCOSE-CAPILLARY: 205 mg/dL — AB (ref 70–99)
Glucose-Capillary: 106 mg/dL — ABNORMAL HIGH (ref 70–99)
Glucose-Capillary: 156 mg/dL — ABNORMAL HIGH (ref 70–99)

## 2017-11-18 LAB — CBC
HCT: 41 % (ref 39.0–52.0)
HEMOGLOBIN: 11.6 g/dL — AB (ref 13.0–17.0)
MCH: 26.4 pg (ref 26.0–34.0)
MCHC: 28.3 g/dL — AB (ref 30.0–36.0)
MCV: 93.2 fL (ref 80.0–100.0)
Platelets: 282 10*3/uL (ref 150–400)
RBC: 4.4 MIL/uL (ref 4.22–5.81)
RDW: 14.8 % (ref 11.5–15.5)
WBC: 14.5 10*3/uL — AB (ref 4.0–10.5)
nRBC: 0 % (ref 0.0–0.2)

## 2017-11-18 LAB — POCT ACTIVATED CLOTTING TIME
ACTIVATED CLOTTING TIME: 246 s
ACTIVATED CLOTTING TIME: 142 s

## 2017-11-18 LAB — CREATININE, SERUM
CREATININE: 2.16 mg/dL — AB (ref 0.61–1.24)
GFR calc Af Amer: 35 mL/min — ABNORMAL LOW (ref >60)
GFR, EST NON AFRICAN AMERICAN: 30 mL/min — AB (ref >60)

## 2017-11-18 SURGERY — ENDARTERECTOMY, CAROTID
Anesthesia: General | Site: Neck | Laterality: Left

## 2017-11-18 MED ORDER — LACTATED RINGERS IV SOLN
INTRAVENOUS | Status: DC | PRN
  Administered 2017-11-18: 07:00:00 via INTRAVENOUS

## 2017-11-18 MED ORDER — LIDOCAINE HCL (PF) 1 % IJ SOLN
INTRAMUSCULAR | Status: DC | PRN
  Administered 2017-11-18: 30 mL

## 2017-11-18 MED ORDER — LIDOCAINE HCL (PF) 1 % IJ SOLN
INTRAMUSCULAR | Status: AC
  Filled 2017-11-18: qty 30

## 2017-11-18 MED ORDER — OXYCODONE HCL 5 MG PO TABS
5.0000 mg | ORAL_TABLET | ORAL | Status: DC | PRN
  Administered 2017-11-18: 5 mg via ORAL
  Administered 2017-11-19: 10 mg via ORAL
  Filled 2017-11-18: qty 2

## 2017-11-18 MED ORDER — MIDAZOLAM HCL 5 MG/5ML IJ SOLN
INTRAMUSCULAR | Status: DC | PRN
  Administered 2017-11-18: 1 mg via INTRAVENOUS

## 2017-11-18 MED ORDER — DOCUSATE SODIUM 100 MG PO CAPS
100.0000 mg | ORAL_CAPSULE | Freq: Two times a day (BID) | ORAL | Status: DC
  Administered 2017-11-18 – 2017-11-22 (×7): 100 mg via ORAL
  Filled 2017-11-18 (×9): qty 1

## 2017-11-18 MED ORDER — FENTANYL CITRATE (PF) 250 MCG/5ML IJ SOLN
INTRAMUSCULAR | Status: DC | PRN
  Administered 2017-11-18 (×2): 50 ug via INTRAVENOUS

## 2017-11-18 MED ORDER — METOPROLOL TARTRATE 5 MG/5ML IV SOLN: 2.0000 mg | INTRAVENOUS | Status: DC | PRN

## 2017-11-18 MED ORDER — 0.9 % SODIUM CHLORIDE (POUR BTL) OPTIME
TOPICAL | Status: DC | PRN
  Administered 2017-11-18: 2000 mL

## 2017-11-18 MED ORDER — HEMOSTATIC AGENTS (NO CHARGE) OPTIME
TOPICAL | Status: DC | PRN
  Administered 2017-11-18: 1 via TOPICAL

## 2017-11-18 MED ORDER — LIDOCAINE 2% (20 MG/ML) 5 ML SYRINGE
INTRAMUSCULAR | Status: AC
  Filled 2017-11-18: qty 5

## 2017-11-18 MED ORDER — FENTANYL CITRATE (PF) 100 MCG/2ML IJ SOLN: 25.0000 ug | INTRAMUSCULAR | Status: DC | PRN

## 2017-11-18 MED ORDER — ONDANSETRON HCL 4 MG/2ML IJ SOLN: 4.0000 mg | Freq: Four times a day (QID) | INTRAMUSCULAR | Status: DC | PRN

## 2017-11-18 MED ORDER — HEPARIN SODIUM (PORCINE) 1000 UNIT/ML IJ SOLN
INTRAMUSCULAR | Status: DC | PRN
  Administered 2017-11-18: 12000 [IU] via INTRAVENOUS
  Administered 2017-11-18: 2000 [IU] via INTRAVENOUS

## 2017-11-18 MED ORDER — ROCURONIUM BROMIDE 50 MG/5ML IV SOSY
PREFILLED_SYRINGE | INTRAVENOUS | Status: AC
  Filled 2017-11-18: qty 10

## 2017-11-18 MED ORDER — PHENOL 1.4 % MT LIQD: 1.0000 | OROMUCOSAL | Status: DC | PRN

## 2017-11-18 MED ORDER — EPHEDRINE 5 MG/ML INJ
INTRAVENOUS | Status: AC
  Filled 2017-11-18: qty 10

## 2017-11-18 MED ORDER — OXYCODONE-ACETAMINOPHEN 5-325 MG PO TABS: 1.0000 | ORAL_TABLET | Freq: Four times a day (QID) | ORAL | 0 refills | Status: AC | PRN

## 2017-11-18 MED ORDER — INSULIN ASPART 100 UNIT/ML ~~LOC~~ SOLN: 90.0000 [IU] | Freq: Every day | SUBCUTANEOUS | Status: DC

## 2017-11-18 MED ORDER — OXYCODONE HCL 5 MG PO TABS
ORAL_TABLET | ORAL | Status: AC
  Filled 2017-11-18: qty 1

## 2017-11-18 MED ORDER — HYDROMORPHONE HCL 1 MG/ML IJ SOLN: 0.5000 mg | INTRAMUSCULAR | Status: DC | PRN

## 2017-11-18 MED ORDER — TIZANIDINE HCL 4 MG PO TABS: 2.0000 mg | ORAL_TABLET | Freq: Four times a day (QID) | ORAL | Status: DC | PRN

## 2017-11-18 MED ORDER — SODIUM CHLORIDE 0.9 % IV SOLN
INTRAVENOUS | Status: DC | PRN
  Administered 2017-11-18: 50 ug/min via INTRAVENOUS

## 2017-11-18 MED ORDER — PHENYLEPHRINE 40 MCG/ML (10ML) SYRINGE FOR IV PUSH (FOR BLOOD PRESSURE SUPPORT)
PREFILLED_SYRINGE | INTRAVENOUS | Status: AC
  Filled 2017-11-18: qty 10

## 2017-11-18 MED ORDER — SODIUM CHLORIDE 0.9 % IV SOLN
INTRAVENOUS | Status: DC
  Administered 2017-11-18: 12:00:00 via INTRAVENOUS

## 2017-11-18 MED ORDER — FENTANYL CITRATE (PF) 250 MCG/5ML IJ SOLN
INTRAMUSCULAR | Status: AC
  Filled 2017-11-18: qty 5

## 2017-11-18 MED ORDER — CHLORHEXIDINE GLUCONATE CLOTH 2 % EX PADS: 6.0000 | MEDICATED_PAD | Freq: Once | CUTANEOUS | Status: DC

## 2017-11-18 MED ORDER — METOPROLOL TARTRATE 100 MG PO TABS
100.0000 mg | ORAL_TABLET | Freq: Two times a day (BID) | ORAL | Status: DC
  Administered 2017-11-18 – 2017-11-21 (×8): 100 mg via ORAL
  Filled 2017-11-18 (×9): qty 1

## 2017-11-18 MED ORDER — POTASSIUM CHLORIDE CRYS ER 20 MEQ PO TBCR: 20.0000 meq | EXTENDED_RELEASE_TABLET | Freq: Every day | ORAL | Status: DC | PRN

## 2017-11-18 MED ORDER — ROCURONIUM BROMIDE 10 MG/ML (PF) SYRINGE
PREFILLED_SYRINGE | INTRAVENOUS | Status: DC | PRN
  Administered 2017-11-18: 30 mg via INTRAVENOUS
  Administered 2017-11-18: 50 mg via INTRAVENOUS

## 2017-11-18 MED ORDER — CEFAZOLIN SODIUM-DEXTROSE 2-4 GM/100ML-% IV SOLN
2.0000 g | Freq: Three times a day (TID) | INTRAVENOUS | Status: AC
  Administered 2017-11-18 (×2): 2 g via INTRAVENOUS
  Filled 2017-11-18 (×2): qty 100

## 2017-11-18 MED ORDER — SODIUM CHLORIDE 0.9 % IV SOLN
INTRAVENOUS | Status: DC
  Administered 2017-11-18: 08:00:00 via INTRAVENOUS

## 2017-11-18 MED ORDER — ENOXAPARIN SODIUM 30 MG/0.3ML ~~LOC~~ SOLN
30.0000 mg | SUBCUTANEOUS | Status: DC
  Administered 2017-11-19 – 2017-11-21 (×3): 30 mg via SUBCUTANEOUS
  Filled 2017-11-18 (×3): qty 0.3

## 2017-11-18 MED ORDER — SENNOSIDES-DOCUSATE SODIUM 8.6-50 MG PO TABS: 1.0000 | ORAL_TABLET | Freq: Every evening | ORAL | Status: DC | PRN

## 2017-11-18 MED ORDER — GUAIFENESIN-DM 100-10 MG/5ML PO SYRP: 15.0000 mL | ORAL_SOLUTION | ORAL | Status: DC | PRN

## 2017-11-18 MED ORDER — EPHEDRINE SULFATE 50 MG/ML IJ SOLN
INTRAMUSCULAR | Status: DC | PRN
  Administered 2017-11-18: 5 mg via INTRAVENOUS
  Administered 2017-11-18: 10 mg via INTRAVENOUS

## 2017-11-18 MED ORDER — ONDANSETRON HCL 4 MG/2ML IJ SOLN: 4.0000 mg | Freq: Once | INTRAMUSCULAR | Status: DC | PRN

## 2017-11-18 MED ORDER — ALBUTEROL SULFATE HFA 108 (90 BASE) MCG/ACT IN AERS
INHALATION_SPRAY | RESPIRATORY_TRACT | Status: DC | PRN
  Administered 2017-11-18: 2 via RESPIRATORY_TRACT

## 2017-11-18 MED ORDER — ASPIRIN-DIPYRIDAMOLE ER 25-200 MG PO CP12
1.0000 | ORAL_CAPSULE | Freq: Two times a day (BID) | ORAL | Status: DC
  Administered 2017-11-18 – 2017-11-22 (×9): 1 via ORAL
  Filled 2017-11-18 (×10): qty 1

## 2017-11-18 MED ORDER — ISOSORBIDE MONONITRATE ER 60 MG PO TB24
120.0000 mg | ORAL_TABLET | Freq: Every day | ORAL | Status: DC
  Administered 2017-11-19: 120 mg via ORAL
  Filled 2017-11-18 (×2): qty 2

## 2017-11-18 MED ORDER — HYDRALAZINE HCL 20 MG/ML IJ SOLN: 5.0000 mg | INTRAMUSCULAR | Status: DC | PRN

## 2017-11-18 MED ORDER — GLYCOPYRROLATE 0.2 MG/ML IJ SOLN
INTRAMUSCULAR | Status: DC | PRN
  Administered 2017-11-18: 0.2 mg via INTRAVENOUS

## 2017-11-18 MED ORDER — INSULIN REGULAR HUMAN (CONC) 500 UNIT/ML ~~LOC~~ SOPN: 50.0000 [IU] | PEN_INJECTOR | Freq: Two times a day (BID) | SUBCUTANEOUS | Status: DC

## 2017-11-18 MED ORDER — ATORVASTATIN CALCIUM 40 MG PO TABS
40.0000 mg | ORAL_TABLET | Freq: Every day | ORAL | Status: DC
  Administered 2017-11-18 – 2017-11-22 (×5): 40 mg via ORAL
  Filled 2017-11-18 (×5): qty 1

## 2017-11-18 MED ORDER — LIDOCAINE 2% (20 MG/ML) 5 ML SYRINGE
INTRAMUSCULAR | Status: DC | PRN
  Administered 2017-11-18: 50 mg via INTRAVENOUS

## 2017-11-18 MED ORDER — PANTOPRAZOLE SODIUM 40 MG PO TBEC
40.0000 mg | DELAYED_RELEASE_TABLET | Freq: Every day | ORAL | Status: DC
  Administered 2017-11-18 – 2017-11-20 (×3): 40 mg via ORAL
  Filled 2017-11-18 (×3): qty 1

## 2017-11-18 MED ORDER — MAGNESIUM SULFATE 2 GM/50ML IV SOLN: 2.0000 g | Freq: Every day | INTRAVENOUS | Status: DC | PRN

## 2017-11-18 MED ORDER — HYDRALAZINE HCL 50 MG PO TABS
100.0000 mg | ORAL_TABLET | Freq: Three times a day (TID) | ORAL | Status: DC
  Administered 2017-11-18 – 2017-11-21 (×9): 100 mg via ORAL
  Filled 2017-11-18 (×12): qty 2

## 2017-11-18 MED ORDER — PHENYLEPHRINE 40 MCG/ML (10ML) SYRINGE FOR IV PUSH (FOR BLOOD PRESSURE SUPPORT)
PREFILLED_SYRINGE | INTRAVENOUS | Status: DC | PRN
  Administered 2017-11-18: 40 ug via INTRAVENOUS
  Administered 2017-11-18: 80 ug via INTRAVENOUS
  Administered 2017-11-18: 40 ug via INTRAVENOUS
  Administered 2017-11-18: 80 ug via INTRAVENOUS

## 2017-11-18 MED ORDER — SUCCINYLCHOLINE CHLORIDE 20 MG/ML IJ SOLN
INTRAMUSCULAR | Status: DC | PRN
  Administered 2017-11-18: 120 mg via INTRAVENOUS

## 2017-11-18 MED ORDER — SODIUM CHLORIDE 0.9 % IV SOLN
INTRAVENOUS | Status: DC | PRN
  Administered 2017-11-18: 500 mL

## 2017-11-18 MED ORDER — ALUM & MAG HYDROXIDE-SIMETH 200-200-20 MG/5ML PO SUSP: 15.0000 mL | ORAL | Status: DC | PRN

## 2017-11-18 MED ORDER — LABETALOL HCL 5 MG/ML IV SOLN: 10.0000 mg | INTRAVENOUS | Status: DC | PRN

## 2017-11-18 MED ORDER — INSULIN ASPART 100 UNIT/ML ~~LOC~~ SOLN
0.0000 [IU] | Freq: Three times a day (TID) | SUBCUTANEOUS | Status: DC
  Administered 2017-11-18: 5 [IU] via SUBCUTANEOUS
  Administered 2017-11-19: 3 [IU] via SUBCUTANEOUS
  Administered 2017-11-19: 2 [IU] via SUBCUTANEOUS
  Administered 2017-11-19 – 2017-11-20 (×4): 5 [IU] via SUBCUTANEOUS
  Administered 2017-11-21 – 2017-11-22 (×4): 2 [IU] via SUBCUTANEOUS
  Administered 2017-11-22: 8 [IU] via SUBCUTANEOUS

## 2017-11-18 MED ORDER — SODIUM CHLORIDE 0.9 % IV SOLN
INTRAVENOUS | Status: AC
  Filled 2017-11-18: qty 1.2

## 2017-11-18 MED ORDER — HEPARIN SODIUM (PORCINE) 1000 UNIT/ML IJ SOLN
INTRAMUSCULAR | Status: AC
  Filled 2017-11-18: qty 2

## 2017-11-18 MED ORDER — MIDAZOLAM HCL 2 MG/2ML IJ SOLN
INTRAMUSCULAR | Status: AC
  Filled 2017-11-18: qty 2

## 2017-11-18 MED ORDER — SODIUM CHLORIDE 0.9 % IV SOLN: 500.0000 mL | Freq: Once | INTRAVENOUS | Status: DC | PRN

## 2017-11-18 MED ORDER — PROTAMINE SULFATE 10 MG/ML IV SOLN
INTRAVENOUS | Status: DC | PRN
  Administered 2017-11-18 (×2): 20 mg via INTRAVENOUS
  Administered 2017-11-18: 10 mg via INTRAVENOUS

## 2017-11-18 MED ORDER — FUROSEMIDE 80 MG PO TABS
80.0000 mg | ORAL_TABLET | Freq: Every day | ORAL | Status: DC
Start: 2017-11-18 — End: 2017-11-20
  Administered 2017-11-18 – 2017-11-19 (×2): 80 mg via ORAL
  Filled 2017-11-18 (×2): qty 1

## 2017-11-18 MED ORDER — INSULIN ASPART 100 UNIT/ML ~~LOC~~ SOLN
70.0000 [IU] | Freq: Every day | SUBCUTANEOUS | Status: DC
  Filled 2017-11-18 (×9): qty 0.7

## 2017-11-18 MED ORDER — INSULIN REGULAR HUMAN (CONC) 500 UNIT/ML ~~LOC~~ SOPN
50.0000 [IU] | PEN_INJECTOR | Freq: Two times a day (BID) | SUBCUTANEOUS | Status: DC
  Administered 2017-11-18 – 2017-11-21 (×7): 50 [IU] via SUBCUTANEOUS
  Filled 2017-11-18 (×3): qty 3

## 2017-11-18 MED ORDER — PROPOFOL 10 MG/ML IV BOLUS
INTRAVENOUS | Status: AC
  Filled 2017-11-18: qty 20

## 2017-11-18 MED ORDER — INSULIN REGULAR HUMAN (CONC) 500 UNIT/ML ~~LOC~~ SOPN: 40.0000 [IU] | PEN_INJECTOR | Freq: Two times a day (BID) | SUBCUTANEOUS | Status: DC

## 2017-11-18 MED ORDER — PROPOFOL 10 MG/ML IV BOLUS
INTRAVENOUS | Status: DC | PRN
  Administered 2017-11-18: 150 mg via INTRAVENOUS

## 2017-11-18 MED ORDER — SUGAMMADEX SODIUM 200 MG/2ML IV SOLN
INTRAVENOUS | Status: DC | PRN
  Administered 2017-11-18: 200 mg via INTRAVENOUS

## 2017-11-18 SURGICAL SUPPLY — 52 items
CANISTER SUCT 3000ML PPV (MISCELLANEOUS) ×3 IMPLANT
CATH ROBINSON RED A/P 18FR (CATHETERS) ×3 IMPLANT
CLIP VESOCCLUDE MED 24/CT (CLIP) ×3 IMPLANT
CLIP VESOCCLUDE SM WIDE 24/CT (CLIP) ×3 IMPLANT
COVER PROBE W GEL 5X96 (DRAPES) IMPLANT
COVER TRANSDUCER ULTRASND GEL (DRAPE) ×3 IMPLANT
COVER WAND RF STERILE (DRAPES) ×3 IMPLANT
CRADLE DONUT ADULT HEAD (MISCELLANEOUS) ×3 IMPLANT
DERMABOND ADVANCED (GAUZE/BANDAGES/DRESSINGS) ×2
DERMABOND ADVANCED .7 DNX12 (GAUZE/BANDAGES/DRESSINGS) ×1 IMPLANT
DRAIN CHANNEL 15F RND FF W/TCR (WOUND CARE) IMPLANT
ELECT REM PT RETURN 9FT ADLT (ELECTROSURGICAL) ×3
ELECTRODE REM PT RTRN 9FT ADLT (ELECTROSURGICAL) ×1 IMPLANT
EVACUATOR SILICONE 100CC (DRAIN) IMPLANT
GLOVE BIO SURGEON STRL SZ7.5 (GLOVE) ×3 IMPLANT
GLOVE BIOGEL PI IND STRL 8 (GLOVE) ×1 IMPLANT
GLOVE BIOGEL PI INDICATOR 8 (GLOVE) ×2
GOWN STRL REUS W/ TWL LRG LVL3 (GOWN DISPOSABLE) ×2 IMPLANT
GOWN STRL REUS W/ TWL XL LVL3 (GOWN DISPOSABLE) ×2 IMPLANT
GOWN STRL REUS W/TWL LRG LVL3 (GOWN DISPOSABLE) ×4
GOWN STRL REUS W/TWL XL LVL3 (GOWN DISPOSABLE) ×4
GRAFT VASC PATCH XENOSURE 1X14 (Vascular Products) ×3 IMPLANT
HEMOSTAT SNOW SURGICEL 2X4 (HEMOSTASIS) ×3 IMPLANT
IV ADAPTER SYR DOUBLE MALE LL (MISCELLANEOUS) IMPLANT
KIT BASIN OR (CUSTOM PROCEDURE TRAY) ×3 IMPLANT
KIT SHUNT ARGYLE CAROTID ART 6 (VASCULAR PRODUCTS) IMPLANT
KIT TURNOVER KIT B (KITS) ×3 IMPLANT
LOOP VESSEL MINI RED (MISCELLANEOUS) IMPLANT
NEEDLE HYPO 25GX1X1/2 BEV (NEEDLE) IMPLANT
NEEDLE SPNL 20GX3.5 QUINCKE YW (NEEDLE) IMPLANT
NS IRRIG 1000ML POUR BTL (IV SOLUTION) ×9 IMPLANT
PACK CAROTID (CUSTOM PROCEDURE TRAY) ×3 IMPLANT
PAD ARMBOARD 7.5X6 YLW CONV (MISCELLANEOUS) ×6 IMPLANT
SHUNT CAROTID BYPASS 10 (VASCULAR PRODUCTS) ×3 IMPLANT
SHUNT CAROTID BYPASS 12FRX15.5 (VASCULAR PRODUCTS) IMPLANT
SPONGE SURGIFOAM ABS GEL 100 (HEMOSTASIS) IMPLANT
STOPCOCK 4 WAY LG BORE MALE ST (IV SETS) IMPLANT
SUT ETHILON 3 0 PS 1 (SUTURE) IMPLANT
SUT MNCRL AB 4-0 PS2 18 (SUTURE) ×3 IMPLANT
SUT PROLENE 5 0 C 1 24 (SUTURE) ×3 IMPLANT
SUT PROLENE 6 0 BV (SUTURE) ×9 IMPLANT
SUT PROLENE 7 0 BV1 MDA (SUTURE) ×3 IMPLANT
SUT SILK 3 0 (SUTURE)
SUT SILK 3-0 18XBRD TIE 12 (SUTURE) IMPLANT
SUT VIC AB 2-0 CT1 27 (SUTURE) ×2
SUT VIC AB 2-0 CT1 TAPERPNT 27 (SUTURE) ×1 IMPLANT
SUT VIC AB 3-0 SH 27 (SUTURE) ×2
SUT VIC AB 3-0 SH 27X BRD (SUTURE) ×1 IMPLANT
SYR CONTROL 10ML LL (SYRINGE) IMPLANT
TOWEL GREEN STERILE (TOWEL DISPOSABLE) ×3 IMPLANT
TUBING ART PRESS 48 MALE/FEM (TUBING) ×6 IMPLANT
WATER STERILE IRR 1000ML POUR (IV SOLUTION) ×3 IMPLANT

## 2017-11-18 NOTE — Anesthesia Procedure Notes (Signed)
Procedure Name: Intubation Date/Time: 11/18/2017 7:44 AM Performed by: Mariea Clonts, CRNA Pre-anesthesia Checklist: Patient identified, Emergency Drugs available, Suction available and Patient being monitored Patient Re-evaluated:Patient Re-evaluated prior to induction Oxygen Delivery Method: Circle System Utilized Preoxygenation: Pre-oxygenation with 100% oxygen Induction Type: IV induction Ventilation: Mask ventilation without difficulty and Oral airway inserted - appropriate to patient size Laryngoscope Size: Mac and 4 Grade View: Grade II Tube type: Oral Tube size: 8.0 mm Number of attempts: 1 Airway Equipment and Method: Stylet and Oral airway Placement Confirmation: ETT inserted through vocal cords under direct vision,  positive ETCO2 and breath sounds checked- equal and bilateral Tube secured with: Tape Dental Injury: Teeth and Oropharynx as per pre-operative assessment
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
## 2017-11-18 NOTE — Anesthesia Postprocedure Evaluation (Signed)
Anesthesia Post Note  Patient: Justin Bartlett  Procedure(s) Performed: ENDARTERECTOMY CAROTID (Left Neck) PATCH ANGIOPLASTY USING XENOSURE BIOLOGIC PATCH (Left Neck)     Patient location during evaluation: PACU Anesthesia Type: General Level of consciousness: awake and alert Pain management: pain level controlled Vital Signs Assessment: post-procedure vital signs reviewed and stable Respiratory status: spontaneous breathing, nonlabored ventilation, respiratory function stable and patient connected to nasal cannula oxygen Cardiovascular status: blood pressure returned to baseline and stable Postop Assessment: no apparent nausea or vomiting Anesthetic complications: no    Last Vitals:  Vitals:   11/18/17 1126 11/18/17 1154  BP: (!) 124/59 (!) 133/57  Pulse: 78 74  Resp: 18 20  Temp: 36.6 C 36.7 C  SpO2: 94% 94%    Last Pain:  Vitals:   11/18/17 1154  TempSrc: Oral  PainSc:                  Kennieth Rad

## 2017-11-18 NOTE — Anesthesia Procedure Notes (Signed)
Arterial Line Insertion Start/End10/14/2019 7:05 AM, 11/18/2017 7:10 AM Performed by: CRNA  Patient location: Pre-op. Preanesthetic checklist: patient identified, IV checked, site marked, risks and benefits discussed, surgical consent, monitors and equipment checked, pre-op evaluation, timeout performed and anesthesia consent Lidocaine 1% used for infiltration Right, radial was placed Catheter size: 20 G Hand hygiene performed  and maximum sterile barriers used   Attempts: 2 Procedure performed without using ultrasound guided technique. Following insertion, dressing applied and Biopatch. Post procedure assessment: normal  Patient tolerated the procedure well with no immediate complications.

## 2017-11-18 NOTE — Telephone Encounter (Signed)
sch appt spk to pt wife 12/10/17 945am p/o MD

## 2017-11-18 NOTE — Progress Notes (Signed)
[facility]  Subjective  - Alert and follow commands.   Objective (!) 133/57 74 98 F (36.7 C) (Oral) 20 94%  Intake/Output Summary (Last 24 hours) at 11/18/2017 1321 Last data filed at 11/18/2017 0907 Gross per 24 hour  Intake 1000 ml  Output 25 ml  Net 975 ml    Left neck incision clean and dry without hematoma Moving all 4 ext. Speech clear, no tongue deviation and smile is symmetric.  Assessment/Planning: POD # left CEA  Stable for transfer to 4E  Mosetta Pigeon 11/18/2017 1:21 PM --  Laboratory Lab Results: Recent Labs    11/18/17 1231  WBC 14.5*  HGB 11.6*  HCT 41.0  PLT 282   BMET No results for input(s): NA, K, CL, CO2, GLUCOSE, BUN, CREATININE, CALCIUM in the last 72 hours.  COAG Lab Results  Component Value Date   INR 1.08 11/12/2017   INR 1.00 06/26/2016   INR 1.20 04/28/2014   No results found for: PTT

## 2017-11-18 NOTE — Transfer of Care (Signed)
Immediate Anesthesia Transfer of Care Note  Patient: Justin Bartlett  Procedure(s) Performed: ENDARTERECTOMY CAROTID (Left Neck) PATCH ANGIOPLASTY USING XENOSURE BIOLOGIC PATCH (Left Neck)  Patient Location: PACU  Anesthesia Type:General  Level of Consciousness: awake, alert  and oriented  Airway & Oxygen Therapy: Patient Spontanous Breathing and Patient connected to face mask oxygen  Post-op Assessment: Report given to RN, Post -op Vital signs reviewed and stable, Patient moving all extremities X 4 and Patient able to stick tongue midline  Post vital signs: Reviewed and stable  Last Vitals:  Vitals Value Taken Time  BP 130/70 11/18/2017 10:41 AM  Temp    Pulse 75 11/18/2017 10:43 AM  Resp 20 11/18/2017 10:43 AM  SpO2 87 % 11/18/2017 10:43 AM  Vitals shown include unvalidated device data.  Last Pain:  Vitals:   11/18/17 0603  TempSrc:   PainSc: 0-No pain         Complications: No apparent anesthesia complications

## 2017-11-18 NOTE — Op Note (Signed)
OPERATIVE NOTE  PROCEDURE:   1.  Left carotid endarterectomy with bovine patch angioplasty 2.  Left intraoperative carotid ultrasound  PRE-OPERATIVE DIAGNOSIS: Left high grade asymptomatic carotid stenosis  POST-OPERATIVE DIAGNOSIS: same as above   SURGEON: Cephus Shelling, MD  ASSISTANT(S): Mosetta Pigeon, PA  ANESTHESIA: general  ESTIMATED BLOOD LOSS: <50 cc  FINDING(S): There was a high-grade calcified carotid plaque that extended from the proximal ICA bifurcation into the mid ICA.  Ultimately the distal extent of the lesion was very high and we had to fully mobilize the hypoglossal nerve in order to find an appropriate clamp site distally.  Given that our distal ICA clamp was near the skull base I did not have enough room to shunt.  Patient had robust backbleeding from the ICA stump and as a result I felt like he should have enough perfusion even in the absence of shunting.  SPECIMEN(S):  Carotid plaque (sent to Pathology)  INDICATIONS:   Justin Bartlett is a 67 y.o. male who presents with left high grade asymptomatic carotid stenosis >80%.  I discussed with the patient the risks, benefits, and alternatives to carotid endarterectomy.  I discussed the procedural details of carotid endarterectomy with the patient.  The patient is aware that the risks of carotid endarterectomy include but are not limited to: bleeding, infection, stroke, myocardial infarction, death, cranial nerve injuries both temporary and permanent, neck hematoma, possible airway compromise, labile blood pressure post-operatively, cerebral hyperperfusion syndrome, and possible need for additional interventions in the future. The patient is aware of the risks and agrees to proceed forward with the procedure.  DESCRIPTION: After full informed written consent was obtained from the patient, the patient was brought back to the operating room and placed supine upon the operating table.  Prior to induction, the  patient received IV antibiotics.  After obtaining adequate anesthesia, the patient was placed into semi-Fowler position with a shoulder roll in place and the patient's neck slightly hyperextended and rotated away from the surgical site.  I did use intraoperative ultrasound to mark the carotid bifurcation.  The patient was prepped in the standard fashion for a left carotid endarterectomy.  I made an incision anterior to the sternocleidomastoid muscle and dissected down through the subcutaneous tissue.  The platysma was opened with electrocautery.  I then used Bovie cautery and blunt dissection to dissect through the underlying platysma and to mobilize the anterior border of the sternocleidomastoid as well as the internal jugular vein laterally.  The facial vein was ligated with 3-0 silk and surgical clips and divided.  After identifying the carotid artery I used Metzenbaum scissors to bluntly dissect the common carotid artery and then controlled this with a umbilical tape.  At this point in time the patient was given 100 units/kg of IV heparin and we checked an ACT to ensure it was greater than 250.  I then carried my dissection cephalad and mobilized the external carotid artery and superior thyroid artery and controlled each of these with a vessel loop.  I then dissected out the internal carotid artery.  Ultimately his plaque extended into the mid ICA and I had to mobilize the plane between the ICU and IJ vein and ultimately fully mobilized the hypoglossal nerve medially to get to healthy artery distally and well past the distal plaque.  The internal carotid artery was then controlled with a umbilical tape as well. I was careful to identify the vagus nerve between the internal jugular and common carotid  and this was presereved.  I was also careful to identify and preserve the hypoglossal nerve and this was preserved.    Once our ACT was confirmed, I proceeded by clamping the internal carotid artery with a bulldog  clamp first.  The proximal common carotid artery was controlled with a Debakey clamp.  The external carotid was controlled with a vessel loop.  I subsequently opened the common carotid artery with an 11 blade scalpel in longitudinal fashion and extended the arteriotomy with Potts scissors onto the ICA.  Unfortunately even with distal clamp placement I did not feel I was distal enough and reset my clamp higher.  Given I clamped so high, I attempted to check a stump pressure since I did not think I had enough room to shunt.  Unfortunately I could not get my olive tip with an arterial line setup to go in the ICU easily so I aborted.  The patient had robust backbleeding.  I then used a Runner, broadcasting/film/video and performed a endarterectomy starting in the common carotid artery.  The external carotid artery was endarterectomized with an eversion technique and I was careful to feather the distal ICA plaque.  The specimen was passed off the field.  The endarterectomy site was then flushed with heparinized saline and I was careful to ensure there were no flaps in the endarterectomy site.  Given that my distal endpoint was so high , I placed several 7-0 prolene tacking sutures at the distal endarterectomy site.  I then brought a bovine carotid patch on the field and this was sewn in place with a running anastomosis using a 6-0 Prolene distally and a 5-0 proximal.  The bovine patch was trimmed accordingly.  I de-aired the patch prior to completion.  Once the patch was complete, I flushed up the external carotid artery first prior to releasing the internal carotid artery clamp.  An intraoperative duplex was performed that showed no evidence of a flap and a widely patent distal endpoint.  I also checked the distal ICA and ECA and they both had normal robust signals.  Once I was happy with the intraoperative ultrasound and doppler the patient was given protamine for reversal.  I used surgicel snow to get hemostasis around the patch.   Ultimately the platysma was closed in running fashion with 3-0 Vicryl.  The skin was closed with a running 4-0 Monocryl.  Dermabond was applied with a dry sterile dressing.  The patient was awakened from anesthesia with no new neurological deficit and was moving his right side on command and taken to PACU in stable condition.    COMPLICATIONS: None  CONDITION: Stable  Cephus Shelling, MD [facility] [place] Office: [phone] Pager: [phone]  11/18/2017, 10:33 AM

## 2017-11-19 ENCOUNTER — Inpatient Hospital Stay (HOSPITAL_COMMUNITY): Payer: Medicare Other

## 2017-11-19 ENCOUNTER — Encounter (HOSPITAL_COMMUNITY): Payer: Self-pay | Admitting: Vascular Surgery

## 2017-11-19 LAB — BASIC METABOLIC PANEL
ANION GAP: 6 (ref 5–15)
BUN: 36 mg/dL — AB (ref 8–23)
CO2: 26 mmol/L (ref 22–32)
Calcium: 8.2 mg/dL — ABNORMAL LOW (ref 8.9–10.3)
Chloride: 107 mmol/L (ref 98–111)
Creatinine, Ser: 2.51 mg/dL — ABNORMAL HIGH (ref 0.61–1.24)
GFR calc Af Amer: 29 mL/min — ABNORMAL LOW (ref >60)
GFR, EST NON AFRICAN AMERICAN: 25 mL/min — AB (ref >60)
GLUCOSE: 146 mg/dL — AB (ref 70–99)
POTASSIUM: 5.1 mmol/L (ref 3.5–5.1)
Sodium: 139 mmol/L (ref 135–145)

## 2017-11-19 LAB — CBC
HEMATOCRIT: 41.7 % (ref 39.0–52.0)
Hemoglobin: 11.7 g/dL — ABNORMAL LOW (ref 13.0–17.0)
MCH: 26.7 pg (ref 26.0–34.0)
MCHC: 28.1 g/dL — ABNORMAL LOW (ref 30.0–36.0)
MCV: 95.2 fL (ref 80.0–100.0)
PLATELETS: 290 10*3/uL (ref 150–400)
RBC: 4.38 MIL/uL (ref 4.22–5.81)
RDW: 14.7 % (ref 11.5–15.5)
WBC: 17.3 10*3/uL — ABNORMAL HIGH (ref 4.0–10.5)
nRBC: 0 % (ref 0.0–0.2)

## 2017-11-19 LAB — GLUCOSE, CAPILLARY
GLUCOSE-CAPILLARY: 125 mg/dL — AB (ref 70–99)
GLUCOSE-CAPILLARY: 207 mg/dL — AB (ref 70–99)
Glucose-Capillary: 165 mg/dL — ABNORMAL HIGH (ref 70–99)
Glucose-Capillary: 204 mg/dL — ABNORMAL HIGH (ref 70–99)

## 2017-11-19 NOTE — Progress Notes (Signed)
SATURATION QUALIFICATIONS: (This note is used to comply with regulatory documentation for home oxygen)  Patient Saturations on Room Air at Rest = 84-88%  Patient Saturations on Room Air while Ambulating = 79-83%  Patient Saturations on 2 Liters of oxygen while Ambulating = 90-93%  Please briefly explain why patient needs home oxygen:

## 2017-11-19 NOTE — Discharge Summary (Addendum)
Discharge Summary     Justin Bartlett [birth year] 67 y.o. male  [id number]  Admission Date: 11/18/2017  Discharge Date: 11/22/17  Physician: No att. providers found  Admission Diagnosis: left carotid stenosis   HPI:   This is a 67 y.o. male with history of coronary artery disease status post CABG in 2000, stage IV chronic kidney disease, diabetes thatpresents for evaluation of asymptomatic high-grade left ICA stenosis. Patient has been under surveillance for some time by his cardiologist. On most recent duplex he has had progression of his left ICA stenosis to 412/130 consistent with a high-grade lesion. He denies any recent TIAs or strokes. He was seen in the ED this weekend for some generalized fatigue and leg weakness that was nonfocal. Patient denies tobacco abuse. He does take Aggrenox. He is scheduled to see his cardiologist again tomorrow. Patient recently had hip surgery about a year ago and said he did okay with anesthesia. He denies any anterior neck surgery or neck radiation. He did have a left-sided neck abscess on the posterior margin of his neck and previouslyunderwent incision and drainage. Regarding previous history of CVA he states that in 2006 he had some confusion at home and was ultimately told he had a stroke but has had no subsequent symptoms since then.  Hospital Course:  The patient was admitted to the hospital and taken to the operating room on 11/18/2017 and underwent left carotid endarterectomy.    FINDING(S): There was a high-grade calcified carotid plaque that extended from the proximal ICA bifurcation into the mid ICA.  Ultimately the distal extent of the lesion was very high and we had to fully mobilize the hypoglossal nerve in order to find an appropriate clamp site distally.  Given that our distal ICA clamp was near the skull base I did not have enough room to shunt.  Patient had robust backbleeding from the ICA stump and as a result I felt  like he should have enough perfusion even in the absence of shunting.  The pt tolerated the procedure well and was transported to the PACU in good condition.   By POD 1, the pt neuro status was in tact.  He did not have any swallowing difficulty.   He was doing well, however, when he ambulated he became short of breath and his oxygen saturations dropped.  A cxr was obtained with the following results: IMPRESSION: Slight obscuration of the right hemidiaphragm and right heart border presumably from right basilar atelectasis and/or a small effusion superimposed on chronic elevation of the right hemidiaphragm.  He has an incentive spirometer ordered as well as home health.  He will be discharged on home oxygen.    His creatinine was up slightly to 2.5 from 2.3.  He will f/u with [facility] in the next week to check his labs and make sure they are stable.    He will call sooner should there be any issues.   On POD 1 before discharging, he was having some shortness of breath while walking and then had a choking episode, so his discharge was cancelled.  67 year old male postop day 1 status post left carotid.  Initial plan for discharge this afternoon but patient had oxygen saturations drop while walking in the hall.  Chest x-ray shows chronic elevation of the right hemidiaphragm and some atelectasis.  Came by this afternoon to see him again and instructed him to use his incentive spirometer now pulling about 750.  Hemodynamics and vital signs stable.  Neck looks  stable as well except evidence of bruising.  Patient states he did fine with breakfast this morning and is tolerating liquids fine but had some choking this afternoon while trying to eat chicken.  Will delay discharge today given the oxygen requirement and the fact that he had some choking episode this afternoon with some mild dysphasia.  In evaluating him this afternoon he states liquids are going down fine and he has tolerated some mashed  potatoes without issue.  We will continue to monitor.  By POD 2, pt's oxygen requirements increased and discharge was delayed.   pulmonary, renal and speech tx consults were obtained.   It was suspected that he may have had an aspiration given he had a choking episode the previous day.    He did have BLE venous duplex that were negative for DVT.    Pt did have a slight increase in creatinine in setting of CKD 4.  He was diuresed.  His hypoxia did improve with diuresis.  Per renal, he had some component of obstruction on u/s with ?? Accuracy of bladder scans.  Shrunken kidney c/w nephrosclerosis as primary injury.    Neurology was consulted on 11/20/17 for right arm weakness.  This did resolved.  He had a state CT scan of the head with the following results: IMPRESSION: 1. Small area of left parietal lobe sensory strip age indeterminate ischemia. No other changes of acute cortically based infarct identified. ASPECTS is 9. 2. Progressed and moderate for age cerebral white matter disease since 2005. 3. Progressed bilateral middle ear and mastoid opacification. Consider otitis media. 4. These results were communicated to Dr. Otelia Limes at 2:26 pmon 10/16/2019by text page via the [facility] messaging system.  He also had an MRI of the brain with the following results: IMPRESSION: 1. No acute intracranial abnormality. 2. Chronic ischemic microangiopathy and generalized volume loss. Speech tx recommended regular thin liquid diet.   Patient seen again this afternoon after code stroke called earlier today for some right sided weakness.  On my evaluation this afternoon patient is neurologically intact with no appreciable deficits and is alert and oriented x3.  Discussed with neurology and unclear etiology for this earlier event and complete resolution was noted immediately after evaluation.  MRI brain will be obtained for complete work-up.  We will continue his Aggrenox for now.  We have been diuresing him  today with the assistance of nephrology and he is down from 8 L to 3 L nasal cannula.  Overall he looks better this afternoon.  Will re-check kidney function in am.  Also appreciate speech evaluation and they recommended nectar thick liquids with barium swallow tomorrow.  Updated patient and his family.  By POD 3, pt was doing much better.  He was down to 1LO2NC and had walked 166ft with 2L and did not desaturate.  He was continued on Aggreox.   On POD 4, he was weaned off O2 with diuresis and kidney numbers improved.  He was placed on regular diet and think liquids with chin tuck. His hydral/isordil was held.  He was discharged home on POD 4.   The remainder of the hospital course consisted of increasing mobilization and increasing intake of solids without difficulty.  CBC    Component Value Date/Time   WBC 17.3 (H) 11/19/2017 0410   RBC 4.38 11/19/2017 0410   HGB 11.7 (L) 11/19/2017 0410   HGB 13.7 10/16/2016 0938   HCT 41.7 11/19/2017 0410   HCT 41.6 10/16/2016 0938   PLT  290 11/19/2017 0410   PLT 307 10/16/2016 0938   MCV 95.2 11/19/2017 0410   MCV 86 10/16/2016 0938   MCH 26.7 11/19/2017 0410   MCHC 28.1 (L) 11/19/2017 0410   RDW 14.7 11/19/2017 0410   RDW 14.6 10/16/2016 0938   LYMPHSABS 1.3 06/26/2016 0852   MONOABS 1.4 (H) 06/26/2016 0852   EOSABS 0.7 06/26/2016 0852   BASOSABS 0.1 06/26/2016 0852   BMET    Component Value Date/Time   NA 139 11/22/2017 0330   NA 142 10/16/2016 0938   K 3.7 11/22/2017 0330   CL 97 (L) 11/22/2017 0330   CO2 31 11/22/2017 0330   GLUCOSE 119 (H) 11/22/2017 0330   BUN 59 (H) 11/22/2017 0330   BUN 27 10/16/2016 0938   CREATININE 2.13 (H) 11/22/2017 0330   CALCIUM 8.7 (L) 11/22/2017 0330   GFRNONAA 30 (L) 11/22/2017 0330   GFRAA 35 (L) 11/22/2017 0330      Discharge Diagnosis:  left carotid stenosis  Secondary Diagnosis: Patient Active Problem List   Diagnosis Date Noted  . Primary localized osteoarthritis of left hip  07/04/2016  . Primary osteoarthritis of left hip 07/03/2016  . SOB (shortness of breath)   . Pleural effusion 04/27/2014  . CAD (coronary artery disease) 04/27/2014  . Ischemic cardiomyopathy 04/27/2014  . Acute on chronic combined systolic and diastolic CHF (congestive heart failure) (HCC) 04/27/2014  . Chest pain at rest 04/27/2014  . Chronic kidney disease (CKD), stage IV (severe) (HCC) 04/27/2014  . Carotid stenosis 04/27/2014  . Elevated troponin 04/27/2014  . Left carotid bruit 03/26/2012  . Ischemic heart disease 08/07/2010  . Benign hypertensive heart disease without heart failure 08/07/2010  . Diabetes mellitus (HCC) 08/07/2010  . Exogenous obesity 08/07/2010  . Hypercholesterolemia 08/07/2010   Past Medical History:  Diagnosis Date  . Arthritis    "right hand" (11/19/2017)  . CAD in native artery 02/1999   Subtotal LAD & servere RCA, ~40-50% LM --> CABG  . CHF (congestive heart failure) (HCC)    15  . CKD (chronic kidney disease), stage IV [facility])    Stage 4- Dr. Joretta Bachelor [facility]  . Gallstones   . History of stomach ulcers    upon EGD with Dr. Adaline Sill Omeprazole daily  . Hypercholesterolemia    takes Vytorin nightly  . Hypertension    takes Ramipril,Metoprolol,and Diltiazem daily  . NSTEMI (non-ST elevated myocardial infarction) (HCC) 04/2014  . Obesity   . Peripheral edema    wears compression stockings daily  . 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   . Stroke [facility]) 2006   "very mild"; no residual/pt 11/19/2017  . Toenail fungus    Lamisil on feet daily and Sporanox daily  . Type II diabetes mellitus (HCC)    insulin dependent;takes Humulin R    Allergies as of 11/22/2017      Reactions   Actos [pioglitazone] Other (See Comments)   "Elevated kidney function"   Hctz [hydrochlorothiazide] Other (See Comments)   "BP issues"      Medication List    TAKE these  medications   atorvastatin 40 MG tablet Commonly known as:  LIPITOR TAKE 1 TABLET(40 MG) BY MOUTH DAILY What changed:  See the new instructions.   dipyridamole-aspirin 200-25 MG 12hr capsule Commonly known as:  AGGRENOX Take 1 capsule by mouth 2 (two) times daily.   docusate sodium 100 MG capsule Commonly known as:  COLACE Take 100 mg by mouth 2 (two) times daily.   furosemide 80 MG tablet Commonly known as:  LASIX Take 1 tablet (80 mg total) by mouth daily.   HUMULIN R U-500 KWIKPEN 500 UNIT/ML kwikpen Generic drug:  insulin regular human CONCENTRATED Inject 70-90 Units into the skin See admin instructions. Inject 90 units into the skin before breakfast and 70 units before supper   hydrALAZINE 100 MG tablet Commonly known as:  APRESOLINE Take 1 tablet (100 mg total) by mouth 3 (three) times daily.   INSULIN SYRINGE .3CC/29GX1/2" 29G X 1/2" 0.3 ML Misc Inject as directed as directed.   isosorbide mononitrate 120 MG 24 hr tablet Commonly known as:  IMDUR TAKE 1 TABLET(120 MG) BY MOUTH DAILY What changed:  See the new instructions.   MEGARED OMEGA-3 KRILL OIL PO Take 1 capsule by mouth daily.   metoprolol tartrate 100 MG tablet Commonly known as:  LOPRESSOR Take 100 mg by mouth 2 (two) times daily.   multivitamin with minerals Tabs tablet Take 1 tablet by mouth daily.   oxyCODONE-acetaminophen 5-325 MG tablet Commonly known as:  PERCOCET/ROXICET Take 1 tablet by mouth every 6 (six) hours as needed.   tiZANidine 2 MG tablet Commonly known as:  ZANAFLEX Take 1 tablet (2 mg total) by mouth every 6 (six) hours as needed for muscle spasms.   Vitamin D-3 1000 units Caps Take 3,000 Units by mouth daily.        [facility] Discharge Instructions Carotid Endarterectomy (CEA)  Please refer to the following instructions for your post-procedure care. Your surgeon or physician assistant will discuss any changes with  you.  Activity  You are encouraged to walk as much as you can. You can slowly return to normal activities but must avoid strenuous activity and heavy lifting until your doctor tell you it's OK. Avoid activities such as vacuuming or swinging a golf club. You can drive after one week if you are comfortable and you are no longer taking prescription pain medications. It is normal to feel tired for serval weeks after your surgery. It is also normal to have difficulty with sleep habits, eating, and bowel movements after surgery. These will go away with time.  Bathing/Showering  You may shower after you come home. Do not soak in a bathtub, hot tub, or swim until the incision heals completely.  Incision Care  Shower every day. Clean your incision with mild soap and water. Pat the area dry with a clean towel. You do not need a bandage unless otherwise instructed. Do not apply any ointments or creams to your incision. You may have skin glue on your incision. Do not peel it off. It will come off on its own in about one week. Your incision may feel thickened and raised for several weeks after your surgery. This is normal and the skin will soften over time. For Men Only: It's OK to shave around the incision but do not shave the incision itself for 2 weeks. It is common to have numbness under your chin that could last for several months.  Diet  Resume your normal diet. There are no special food restrictions following this procedure. A low fat/low cholesterol diet is recommended for all patients with vascular disease. In order to heal from your surgery, it is CRITICAL to get adequate nutrition. Your body requires vitamins, minerals, and protein. Vegetables are the best source of vitamins and minerals. Vegetables also provide the perfect balance of protein.  Processed food has little nutritional value, so try to avoid this.  Medications  Resume taking all of your medications unless your doctor or physician  assistant tells you not to.  If your incision is causing pain, you may take over-the- counter pain relievers such as acetaminophen (Tylenol). If you were prescribed a stronger pain medication, please be aware these medications can cause nausea and constipation.  Prevent nausea by taking the medication with a snack or meal. Avoid constipation by drinking plenty of fluids and eating foods with a high amount of fiber, such as fruits, vegetables, and grains.  Do not take Tylenol if you are taking prescription pain medications.  Follow Up  Our office will schedule a follow up appointment 2-3 weeks following discharge.  Please call us immediately for any of the following conditions  . Increased pain, redness, drainage (pus) from your incision site. . Fever of 101 degrees or higher. . If you should develop stroke (slurred speech, difficulty swallowing, weakness on one side of your body, loss of vision) you should call 911 and go to the nearest emergency room. .  Reduce your risk of vascular disease:  . Stop smoking. If you would like help call [facility] at 1-800-QUIT-NOW (581-803-4828) or [facility] at (260)532-5494. . Manage your cholesterol . Maintain a desired weight . Control your diabetes . Keep your blood pressure down .  If you have any questions, please call the office at [phone].  Prescriptions given: 1.   Roxicet #6 No Refill         Disposition: home  Patient's condition: is Good  Follow up: 1. Dr. Chestine Spore in 2 weeks. 2.Dr. Elenore Rota in 1 week to check kidney function  3. Dr. Manus Gunning in the next week to evaluate need for continued home O2.  (discharged on home oxygen after left carotid endarterectomy)   Doreatha Massed, PA-C [facility] [phone]   --- For [facility] Registry use ---   Modified Rankin score at D/C (0-6): 0  IV medication needed for:  1. Hypertension: No 2. Hypotension: No  Post-op Complications: Yes; hypoxia due to possible  aspiration  1. Post-op CVA or TIA: No  If yes: Event classification (right eye, left eye, right cortical, left cortical, verterobasilar, other): n/a  If yes: Timing of event (intra-op, <6 hrs post-op, >=6 hrs post-op, unknown): n/a  2. CN injury: No  If yes: CN n/a injuried   3. Myocardial infarction: No  If yes: Dx by (EKG or clinical, Troponin): n/a  4.  CHF: No  5.  Dysrhythmia (new): No  6. Wound infection: No  7. Reperfusion symptoms: No  8. Return to OR: No  If yes: return to OR for (bleeding, neurologic, other CEA incision, other): n/a  Discharge medications: Statin use:  Yes ASA use:  Yes   Beta blocker use:  Yes ACE-Inhibitor use:  No  ARB use:  No CCB use: No P2Y12 Antagonist use: No, [ ]  Plavix, [ ]  Plasugrel, [ ]  Ticlopinine, [ ]  Ticagrelor, [ ]  Other, [ ]  No for medical reason, [ ]  Non-compliant, [ ]  Not-indicated Anti-coagulant use:  No, [ ]  Warfarin, [ ]  Rivaroxaban, [ ]  Dabigatran,

## 2017-11-19 NOTE — Progress Notes (Signed)
67 year old male postop day 1 status post left carotid.  Initial plan for discharge this afternoon but patient had oxygen saturations drop while walking in the hall.  Chest x-ray shows chronic elevation of the right hemidiaphragm and some atelectasis.  Came by this afternoon to see him again and instructed him to use his incentive spirometer now pulling about 750.  Hemodynamics and vital signs stable.  Neck looks stable as well except evidence of bruising.  Patient states he did fine with breakfast this morning and is tolerating liquids fine but had some choking this afternoon while trying to eat chicken.  Will delay discharge today given the oxygen requirement and the fact that he had some choking episode this afternoon with some mild dysphasia.  In evaluating him this afternoon he states liquids are going down fine and he has tolerated some mashed potatoes without issue.  We will continue to monitor.  Cephus Shelling, MD [facility] [place] Office: [phone] Pager: [phone]  Cephus Shelling

## 2017-11-19 NOTE — Progress Notes (Addendum)
  Progress Note    11/19/2017 7:25 AM 1 Day Post-Op  Subjective:  Feels good this am-says he has some soreness at the incision.  Denies trouble swallowing.  Afebrile 120's-130's systolic HR 60's-70's NSR 96% [id number]  Vitals:   11/18/17 2310 11/19/17 0400  BP: 133/61 135/60  Pulse: 73 66  Resp: 18 10  Temp: 98.3 F (36.8 C) 97.8 F (36.6 C)  SpO2: 97% 96%     Physical Exam: Neuro:  In tact; tongue is midline Lungs:  Non labored Incision:  Clean and dry with ecchymosis left neck; non hematoma  CBC    Component Value Date/Time   WBC 17.3 (H) 11/19/2017 0410   RBC 4.38 11/19/2017 0410   HGB 11.7 (L) 11/19/2017 0410   HGB 13.7 10/16/2016 0938   HCT 41.7 11/19/2017 0410   HCT 41.6 10/16/2016 0938   PLT 290 11/19/2017 0410   PLT 307 10/16/2016 0938   MCV 95.2 11/19/2017 0410   MCV 86 10/16/2016 0938   MCH 26.7 11/19/2017 0410   MCHC 28.1 (L) 11/19/2017 0410   RDW 14.7 11/19/2017 0410   RDW 14.6 10/16/2016 0938   LYMPHSABS 1.3 06/26/2016 0852   MONOABS 1.4 (H) 06/26/2016 0852   EOSABS 0.7 06/26/2016 0852   BASOSABS 0.1 06/26/2016 0852    BMET    Component Value Date/Time   NA 139 11/19/2017 0410   NA 142 10/16/2016 0938   K 5.1 11/19/2017 0410   CL 107 11/19/2017 0410   CO2 26 11/19/2017 0410   GLUCOSE 146 (H) 11/19/2017 0410   BUN 36 (H) 11/19/2017 0410   BUN 27 10/16/2016 0938   CREATININE 2.51 (H) 11/19/2017 0410   CALCIUM 8.2 (L) 11/19/2017 0410   GFRNONAA 25 (L) 11/19/2017 0410   GFRAA 29 (L) 11/19/2017 0410     Intake/Output Summary (Last 24 hours) at 11/19/2017 0725 Last data filed at 11/19/2017 0400 Gross per 24 hour  Intake 2153.33 ml  Output 435 ml  Net 1718.33 ml     Assessment/Plan:  This is a 67 y.o. male who is s/p left CEA 1 Day Post-Op  -pt is doing well this am. -pt neuro exam is in tact -pt has not ambulated -pt has voided -f/u with Dr. Chestine Spore in 2 weeks. -pt needs to ambulate and eat breakfast and will discharge later  this morning.   Doreatha Massed, PA-C [facility] [phone]  I have seen and evaluated the patient. I agree with the PA note as documented above. Doing well POD#1 s/p L CEA.  Some bruising around incision, no hematoma.  Neuro intact.  D/C today on aggrenox.  Recommended 1 week follow-up with nephrologist given Cr 2.2 --> 2.5 just to ensure CKD stable.  Overall doing very well with no complaints.  Cephus Shelling, MD [facility] [place] Office: [phone] Pager: [phone]  Cephus Shelling

## 2017-11-19 NOTE — Progress Notes (Addendum)
RN called to inform me the pt was short of breath when walking and dropped his oxygen saturations.  He denies any chest pain and is hemodynamically stable.   Will chest PA/LAT chest x-ray to make sure pt is not fluid overloaded.   His oxygen saturation sitting in bed is 94% 2LO2NC.  He may need home oxygen if discharged later today.  He was checked for home O2:    SATURATION QUALIFICATIONS: (This note is used to comply with regulatory documentation for home oxygen)  Patient Saturations on Room Air at Rest = 84  Patient Saturations on Room Air while Ambulating = 79  Patient Saturations on 2 Liters of oxygen while Ambulating = 90  Please briefly explain why patient needs home oxygen:  Will check back on pt after he has his CXR.   Doreatha Massed, [facility] 11/19/2017 11:57 AM

## 2017-11-19 NOTE — Care Management Note (Signed)
Case Management Note Donn Pierini RN, BSN Transitions of Care Unit 4E- RN Case Manager [phone]  Patient Details  Name: Justin Bartlett MRN: [id number] Date of Birth: 04-23-1950  Subjective/Objective:   Pt admitted s/p CEA                 Action/Plan: PTA pt lived at home with wife, plan to return home, however notified by PA that pt will need home 02- orders have been placed with qualifying note done. CM spoke with pt at bedside and choice offered for DME agency- per pt he will use [facility] for home 02 needs- call made to Lupita Leash with [facility] for home 02 DME need- once approved - portable tank to be delivered to room for discharge home.   Expected Discharge Date:  11/19/17               Expected Discharge Plan:  Home/Self Care  In-House Referral:  NA  Discharge planning Services  CM Consult  Post Acute Care Choice:  Durable Medical Equipment Choice offered to:  Patient  DME Arranged:  Oxygen DME Agency:  [facility]  HH Arranged:  NA HH Agency:  [facility]  Status of Service:  Completed, signed off  If discussed at Long Length of Stay Meetings, dates discussed:   Discharge Disposition: home/self care    Additional Comments:  Darrold Span, RN 11/19/2017, 2:03 PM

## 2017-11-19 NOTE — Progress Notes (Signed)
Pt doing well this afternoon and saturating well on 2LO2NC.   CXR 11/19/17: IMPRESSION: Slight obscuration of the right hemidiaphragm and right heart border presumably from right basilar atelectasis and/or a small effusion superimposed on chronic elevation of the right hemidiaphragm.  Crowding of interstitial lung markings without active pulmonary disease noted.  -Discussed with Dr. Chestine Spore. -Pt feels well and wants to go home.  He will continue to use his incentive spirometry after he is discharged.  -Will plan for home oxygen and f/u with his PCP in the next week to re-evaluate the need.  Discussed with pt if he has any issues to call our office or present back to the ER.  He and his wife expressed understanding.    Doreatha Massed, [facility] 11/19/2017 2:11 PM

## 2017-11-19 NOTE — Discharge Instructions (Signed)
[facility]  Discharge Instructions   Carotid Endarterectomy (CEA)  Please refer to the following instructions for your post-procedure care. Your surgeon or physician assistant will discuss any changes with you.  Activity  You are encouraged to walk as much as you can. You can slowly return to normal activities but must avoid strenuous activity and heavy lifting until your doctor tell you it's OK. Avoid activities such as vacuuming or swinging a golf club. You can drive after one week if you are comfortable and you are no longer taking prescription pain medications. It is normal to feel tired for serval weeks after your surgery. It is also normal to have difficulty with sleep habits, eating, and bowel movements after surgery. These will go away with time.  Continue to use your incentive spirometer every hour to help exercise your lungs to help lessen the need for home oxygen.   Bathing/Showering  You may shower after you come home. Do not soak in a bathtub, hot tub, or swim until the incision heals completely.  Incision Care  Shower every day. Clean your incision with mild soap and water. Pat the area dry with a clean towel. You do not need a bandage unless otherwise instructed. Do not apply any ointments or creams to your incision. You may have skin glue on your incision. Do not peel it off. It will come off on its own in about one week. Your incision may feel thickened and raised for several weeks after your surgery. This is normal and the skin will soften over time. For Men Only: It's OK to shave around the incision but do not shave the incision itself for 2 weeks. It is common to have numbness under your chin that could last for several months.  Diet  Resume your normal diet. There are no special food restrictions following this procedure. A low fat/low cholesterol diet is recommended for all patients with vascular disease. In order to heal from your  surgery, it is CRITICAL to get adequate nutrition. Your body requires vitamins, minerals, and protein. Vegetables are the best source of vitamins and minerals. Vegetables also provide the perfect balance of protein. Processed food has little nutritional value, so try to avoid this.   Medications  Resume taking all of your medications unless your doctor or physician assistant tells you not to. If your incision is causing pain, you may take over-the- counter pain relievers such as acetaminophen (Tylenol). If you were prescribed a stronger pain medication, please be aware these medications can cause nausea and constipation. Prevent nausea by taking the medication with a snack or meal. Avoid constipation by drinking plenty of fluids and eating foods with a high amount of fiber, such as fruits, vegetables, and grains. Do not take Tylenol if you are taking prescription pain medications.  Follow Up  Our office will schedule a follow up appointment 2-3 weeks following discharge.  Please call us immediately for any of the following conditions  Increased pain, redness, drainage (pus) from your incision site. Fever of 101 degrees or higher. If you should develop stroke (slurred speech, difficulty swallowing, weakness on one side of your body, loss of vision) you should call 911 and go to the nearest emergency room.  Reduce your risk of vascular disease:  Stop smoking. If you would like help call [facility] at 1-800-QUIT-NOW ((830) 314-4915) or [place] at 404-636-3314. Manage your cholesterol Maintain a desired weight Control your diabetes Keep your blood pressure down  If  you have any questions, please call the office at [phone].

## 2017-11-20 ENCOUNTER — Other Ambulatory Visit (HOSPITAL_COMMUNITY): Payer: Medicare Other

## 2017-11-20 ENCOUNTER — Inpatient Hospital Stay (HOSPITAL_COMMUNITY): Payer: Medicare Other

## 2017-11-20 ENCOUNTER — Encounter: Payer: Self-pay | Admitting: *Deleted

## 2017-11-20 DIAGNOSIS — R0602 Shortness of breath: Secondary | ICD-10-CM

## 2017-11-20 DIAGNOSIS — G9341 Metabolic encephalopathy: Secondary | ICD-10-CM

## 2017-11-20 DIAGNOSIS — R278 Other lack of coordination: Secondary | ICD-10-CM

## 2017-11-20 DIAGNOSIS — I6522 Occlusion and stenosis of left carotid artery: Principal | ICD-10-CM

## 2017-11-20 DIAGNOSIS — N179 Acute kidney failure, unspecified: Secondary | ICD-10-CM

## 2017-11-20 DIAGNOSIS — R531 Weakness: Secondary | ICD-10-CM

## 2017-11-20 LAB — URINALYSIS, ROUTINE W REFLEX MICROSCOPIC
Bilirubin Urine: NEGATIVE
GLUCOSE, UA: NEGATIVE mg/dL
Hgb urine dipstick: NEGATIVE
KETONES UR: NEGATIVE mg/dL
Leukocytes, UA: NEGATIVE
NITRITE: NEGATIVE
PH: 5 (ref 5.0–8.0)
Protein, ur: NEGATIVE mg/dL
SPECIFIC GRAVITY, URINE: 1.006 (ref 1.005–1.030)

## 2017-11-20 LAB — GLUCOSE, CAPILLARY
GLUCOSE-CAPILLARY: 247 mg/dL — AB (ref 70–99)
GLUCOSE-CAPILLARY: 226 mg/dL — AB (ref 70–99)
Glucose-Capillary: 212 mg/dL — ABNORMAL HIGH (ref 70–99)
Glucose-Capillary: 217 mg/dL — ABNORMAL HIGH (ref 70–99)
Glucose-Capillary: 204 mg/dL — ABNORMAL HIGH (ref 70–99)

## 2017-11-20 LAB — BASIC METABOLIC PANEL
Anion gap: 11 (ref 5–15)
BUN: 46 mg/dL — AB (ref 8–23)
CHLORIDE: 98 mmol/L (ref 98–111)
CO2: 25 mmol/L (ref 22–32)
Calcium: 8.6 mg/dL — ABNORMAL LOW (ref 8.9–10.3)
Creatinine, Ser: 2.66 mg/dL — ABNORMAL HIGH (ref 0.61–1.24)
GFR calc Af Amer: 27 mL/min — ABNORMAL LOW (ref >60)
GFR calc non Af Amer: 23 mL/min — ABNORMAL LOW (ref >60)
Glucose, Bld: 243 mg/dL — ABNORMAL HIGH (ref 70–99)
POTASSIUM: 5.2 mmol/L — AB (ref 3.5–5.1)
SODIUM: 134 mmol/L — AB (ref 135–145)

## 2017-11-20 LAB — SODIUM, URINE, RANDOM: SODIUM UR: 103 mmol/L

## 2017-11-20 LAB — CREATININE, URINE, RANDOM: Creatinine, Urine: 21.91 mg/dL

## 2017-11-20 MED ORDER — CHLORHEXIDINE GLUCONATE 0.12 % MT SOLN
15.0000 mL | Freq: Two times a day (BID) | OROMUCOSAL | Status: DC
  Administered 2017-11-20 – 2017-11-22 (×4): 15 mL via OROMUCOSAL
  Filled 2017-11-20 (×4): qty 15

## 2017-11-20 MED ORDER — ISOSORBIDE DINITRATE 20 MG PO TABS
40.0000 mg | ORAL_TABLET | Freq: Three times a day (TID) | ORAL | Status: DC
  Administered 2017-11-20 – 2017-11-21 (×4): 40 mg via ORAL
  Filled 2017-11-20 (×6): qty 2

## 2017-11-20 MED ORDER — ACETAMINOPHEN 160 MG/5ML PO SOLN: 650.0000 mg | Freq: Four times a day (QID) | ORAL | Status: DC | PRN

## 2017-11-20 MED ORDER — FUROSEMIDE 10 MG/ML IJ SOLN
160.0000 mg | Freq: Three times a day (TID) | INTRAVENOUS | Status: DC
  Administered 2017-11-20 (×2): 160 mg via INTRAVENOUS
  Filled 2017-11-20: qty 10
  Filled 2017-11-20 (×3): qty 16
  Filled 2017-11-20: qty 10

## 2017-11-20 MED ORDER — ORAL CARE MOUTH RINSE: 15.0000 mL | Freq: Two times a day (BID) | OROMUCOSAL | Status: DC

## 2017-11-20 MED ORDER — ALBUTEROL SULFATE (2.5 MG/3ML) 0.083% IN NEBU: 2.5000 mg | INHALATION_SOLUTION | Freq: Four times a day (QID) | RESPIRATORY_TRACT | Status: DC | PRN

## 2017-11-20 MED ORDER — RESOURCE THICKENUP CLEAR PO POWD
ORAL | Status: DC | PRN
  Filled 2017-11-20: qty 125

## 2017-11-20 NOTE — Progress Notes (Signed)
Patient seen again this afternoon after code stroke called earlier today for some right sided weakness.  On my evaluation this afternoon patient is neurologically intact with no appreciable deficits and is alert and oriented x3.  Discussed with neurology and unclear etiology for this earlier event and complete resolution was noted immediately after evaluation.  MRI brain will be obtained for complete work-up.  We will continue his Aggrenox for now.  We have been diuresing him today with the assistance of nephrology and he is down from 8 L to 3 L nasal cannula.  Overall he looks better this afternoon.  Will re-check kidney function in am.  Also appreciate speech evaluation and they recommended nectar thick liquids with barium swallow tomorrow.  Updated patient and his family.  Cephus Shelling, MD [facility] [place] Office: [phone] Pager: [phone]  Cephus Shelling

## 2017-11-20 NOTE — Code Documentation (Signed)
67 yo male post CEA on Monday reported to have a sudden onset of right sided weakness and slurred speech per wife. Pt was last seen normal at 1200 by the staff and family. At 1350, the family and staff called Rapid Response with report of right sided weakness and slurred speech. Stroke team in his room on 4E15. Pt was alert and oriented x4. He denied any pain or visual changes. Initial NIHSS 1 due to slight slurred speech. Pt had no weakness, but did show signs of muscular jerky. Pt was taking down to CT. CT negative for hemorrhage. Pt had hx of Renal Failure and Creatinine was 2.6. No CTA at this time. No tPA due to patient having surgery on Monday. Not an IR candidate due to not consistent with LVO and symptoms too mild. Pt remained on the monitor and was taken back to 4E to continue being monitored. Handoff was given to 4E RN.

## 2017-11-20 NOTE — Significant Event (Signed)
Rapid Response Event Note  Overview: Time Called: 1343 Arrival Time: 1345 Event Type: Neurologic  Initial Focused Assessment: Patient sitting up in the chair.  He is only answering with the word no to all questions per wife and nurse.  He was last known well at noon today.  Upon my arrival he is alert and initially had difficulty answering questions. He also had a right arm drift.   BP 141/66  SR 69  RR 18  O2 sat 95%   Interventions: NIHSS 3: missed question, right arm weakness and slurred speech. Assisted patient to lying flat in the bed Neuro status improved.  No Right arm weakness, able to answer questions and responsiveness improved. Some asterixis in extremities.  Code Stroke called: stat head CT done, Dr Otelia Limes at bedside to assess patient.  Symptoms improved after lying flat. NIHSS 1 for slurred speech.  Plan Neuro check q 2 hours.   Plan of Care (if not transferred):  Event Summary: Name of Physician Notified: Dr Chestine Spore at 1440  Name of Consulting Physician Notified: Otelia Limes at 1359  Outcome: Stayed in room and stabalized  Event End Time: 1445  Justin Bartlett

## 2017-11-20 NOTE — Progress Notes (Signed)
Inpatient Diabetes Program Recommendations  AACE/ADA: New Consensus Statement on Inpatient Glycemic Control (2015)  Target Ranges:  Prepandial:   less than 140 mg/dL      Peak postprandial:   less than 180 mg/dL (1-2 hours)      Critically ill patients:  140 - 180 mg/dL   Results for Justin Bartlett, Justin Bartlett (MRN [id number]) as of 11/20/2017 11:28  Ref. Range 11/19/2017 06:28 11/19/2017 11:21 11/19/2017 16:29 11/19/2017 22:00 11/20/2017 06:39  Glucose-Capillary Latest Ref Range: 70 - 99 mg/dL 409 (H) 811 (H) 914 (H) 207 (H) 247 (H)   Review of Glycemic Control  Diabetes history: DM 2 Outpatient Diabetes medications: Humulin R U-500 90 units qam, 70 units qpm Current orders for Inpatient glycemic control: Humulin R U-500 50 units BID, Novolog 0-15 units tid  Inpatient Diabetes Program Recommendations:    Glucose trends increased into the 200 levels. Consider increasing Humulin R U-500 to 60 units BID.  Thanks,  Christena Deem RN, MSN, BC-ADM Inpatient Diabetes Coordinator Team Pager [phone] (8a-5p)

## 2017-11-20 NOTE — Progress Notes (Signed)
When pt is alert and awake pt o2 sat was 94-97% on 3L [place]. Pt o2 dropping when sleeping. Pt orthopneic when sleeping and sats dropping in the 80's. Placed pt on veni mask on 4L. Unable to maintain adequate saturation levels. Called RT. Pt now on veni mask 8L O2 while sleeping maintaining sats over 93%. Will attempt to wean as pt wakes. Will continue to monitor.
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
## 2017-11-20 NOTE — Progress Notes (Signed)
*  Preliminary Results* Bilateral lower extremity venous duplex completed. Bilateral lower extremities are negative for deep vein thrombosis. There is no evidence of Baker's cyst bilaterally.  11/20/2017 10:04 AM Justin Bartlett Clare Gandy

## 2017-11-20 NOTE — Progress Notes (Signed)
Called into room due to new confusion per wife. When RN went into room patient was alert and oriented to person but disoriented to place and time and slow to respond. Rapid RN was called to assess patient as well. Patient with some right sided weakness and slurred speech. Code stroke was called and stat head CT was done. Symptoms improved and Neurology PA recommending frequent neuro checks.Attending physician was notified and is aware

## 2017-11-20 NOTE — Congregational Nurse Program (Signed)
[id number]/GNFAOZ Davis,BSN,RN3,CCM,CN:Congregational nurse visit: found patient to be very relaxed and mentally clear, stated that he was very confused earlier in the day and can not explain occurences  of the day/ is alert and reactive x3 at the present time.  Had Carotid endarectomy on Monday 11/19/2017.  Incision healing well.  Family in the room/prayers held and left patient in a good spirit.

## 2017-11-20 NOTE — Consult Note (Addendum)
Neurology Consultation  Reason for Consult: Code stroke Referring Physician: Chestine Spore  CC: Right arm weakness  History is obtained from: Rapid response, nurse, wife  HPI: Justin Bartlett is a 67 y.o. male who has past medical history of type 2 diabetes, stroke in 2006 with mild residual effect, CABG x5, hypertension, hypercholesterolemia, chronic kidney disease stage IV.  Patient had a left carotid endarterectomy 2 days prior to today and since the operation has had some decreased mentation.  Today patient was given oxygen and seemed to improve.  At 12:00 he seemed to be doing well; however, after this wife noted that he had increased right-sided weakness.  For this reason nurse called code stroke.  On arrival of neurology team he was noted to have right-sided weakness, no sensory deficits.  Patient was immediately brought down to CT scan; no acute intracranial findings or bleed were noted.  At that time it was noted that his weakness had resolved but he had confusion and asterixis on exam.  LKW: 12:00 on 11/20/2017 tpa given?: no, symptoms improved and patient recently had major surgery consisting of left carotid endarterectomy Premorbid modified Rankin scale (mRS): 0 NIHSS 0  ROS:  Unable to obtain due to altered mental status.   Past Medical History:  Diagnosis Date  . Arthritis    "right hand" (11/19/2017)  . CAD in native artery 02/1999   Subtotal LAD & servere RCA, ~40-50% LM --> CABG  . CHF (congestive heart failure) (HCC)    15  . CKD (chronic kidney disease), stage IV [facility])    Stage 4- Dr. Joretta Bachelor [facility]  . Gallstones   . History of stomach ulcers    upon EGD with Dr. Adaline Sill Omeprazole daily  . Hypercholesterolemia    takes Vytorin nightly  . Hypertension    takes Ramipril,Metoprolol,and Diltiazem daily  . NSTEMI (non-ST elevated myocardial infarction) (HCC) 04/2014  . Obesity   . Peripheral edema    wears compression stockings daily  . 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   . Stroke [facility]) 2006   "very mild"; no residual/pt 11/19/2017  . Toenail fungus    Lamisil on feet daily and Sporanox daily  . Type II diabetes mellitus (HCC)    insulin dependent;takes Humulin R     Family History  Problem Relation Age of Onset  . Colon cancer Mother   . Cancer Mother        colon  . Heart attack Father   . Hypertension Father   . Hypertension Sister   . Diabetes Sister      Social History:   reports that he has never smoked. He has never used smokeless tobacco. He reports that he drank alcohol. He reports that he does not use drugs.  Medications  Current Facility-Administered Medications:  .  0.9 %  sodium chloride infusion, 500 mL, Intravenous, Once PRN, Thomasena Edis, Emma M, PA-C .  0.9 %  sodium chloride infusion, , Intravenous, Continuous, Collins, Emma M, PA-C, Stopped at 11/18/17 1612 .  albuterol (PROVENTIL) (2.5 MG/3ML) 0.083% nebulizer solution 2.5 mg, 2.5 mg, Nebulization, Q6H PRN, Minor, Vilinda Blanks, NP .  alum & mag hydroxide-simeth (MAALOX/MYLANTA) 200-200-20 MG/5ML suspension 15-30 mL, 15-30 mL, Oral, Q2H PRN, Thomasena Edis, Emma M, PA-C .  atorvastatin (LIPITOR) tablet 40 mg, 40 mg, Oral, Daily, Clinton Gallant M, PA-C, 40 mg at 11/20/17 1146 .  dipyridamole-aspirin (AGGRENOX) 200-25 MG per 12  hr capsule 1 capsule, 1 capsule, Oral, BID, Clinton Gallant M, PA-C, 1 capsule at 11/19/17 2202 .  docusate sodium (COLACE) capsule 100 mg, 100 mg, Oral, BID, Clinton Gallant M, PA-C, 100 mg at 11/20/17 1146 .  enoxaparin (LOVENOX) injection 30 mg, 30 mg, Subcutaneous, Q24H, Collins, Emma M, PA-C, 30 mg at 11/19/17 1509 .  furosemide (LASIX) 160 mg in dextrose 5 % 50 mL IVPB, 160 mg, Intravenous, TID, Deterding, Fayrene Fearing, MD .  guaiFENesin-dextromethorphan (ROBITUSSIN DM) 100-10 MG/5ML syrup 15 mL, 15 mL, Oral, Q4H PRN, Thomasena Edis, Emma M, PA-C .  hydrALAZINE (APRESOLINE) injection 5  mg, 5 mg, Intravenous, Q20 Min PRN, Collins, Emma M, PA-C .  hydrALAZINE (APRESOLINE) tablet 100 mg, 100 mg, Oral, TID, Clinton Gallant M, PA-C, 100 mg at 11/20/17 1610 .  HYDROmorphone (DILAUDID) injection 0.5-1 mg, 0.5-1 mg, Intravenous, Q2H PRN, Thomasena Edis, Emma M, PA-C .  insulin aspart (novoLOG) injection 0-15 Units, 0-15 Units, Subcutaneous, TID WC, Collins, Emma M, PA-C, 5 Units at 11/20/17 1201 .  insulin regular human CONCENTRATED (HUMULIN R) 500 UNIT/ML kwikpen 50 Units, 50 Units, Subcutaneous, BID WC, Sherren Kerns, MD, 50 Units at 11/20/17 0800 .  isosorbide mononitrate (IMDUR) 24 hr tablet 120 mg, 120 mg, Oral, Daily, Clinton Gallant M, PA-C, Stopped at 11/20/17 1145 .  labetalol (NORMODYNE,TRANDATE) injection 10 mg, 10 mg, Intravenous, Q10 min PRN, Thomasena Edis, Emma M, PA-C .  magnesium sulfate IVPB 2 g 50 mL, 2 g, Intravenous, Daily PRN, Thomasena Edis, Emma M, PA-C .  metoprolol tartrate (LOPRESSOR) injection 2-5 mg, 2-5 mg, Intravenous, Q2H PRN, Thomasena Edis, Emma M, PA-C .  metoprolol tartrate (LOPRESSOR) tablet 100 mg, 100 mg, Oral, BID, Clinton Gallant M, PA-C, 100 mg at 11/20/17 1146 .  ondansetron (ZOFRAN) injection 4 mg, 4 mg, Intravenous, Q6H PRN, Thomasena Edis, Emma M, PA-C .  oxyCODONE (Oxy IR/ROXICODONE) immediate release tablet 5-10 mg, 5-10 mg, Oral, Q4H PRN, Lars Mage, PA-C, 10 mg at 11/19/17 2159 .  pantoprazole (PROTONIX) EC tablet 40 mg, 40 mg, Oral, Daily, Clinton Gallant M, PA-C, 40 mg at 11/20/17 1146 .  phenol (CHLORASEPTIC) mouth spray 1 spray, 1 spray, Mouth/Throat, PRN, Thomasena Edis, Emma M, PA-C .  potassium chloride SA (K-DUR,KLOR-CON) CR tablet 20-40 mEq, 20-40 mEq, Oral, Daily PRN, Thomasena Edis, Emma M, PA-C .  senna-docusate (Senokot-S) tablet 1 tablet, 1 tablet, Oral, QHS PRN, Thomasena Edis, Emma M, PA-C .  tiZANidine (ZANAFLEX) tablet 2 mg, 2 mg, Oral, Q6H PRN, Lars Mage, PA-C   Exam: Current vital signs: BP (!) 129/58 (BP Location: Left Arm)   Pulse 73   Temp 98.4 F (36.9 C)  (Oral)   Resp 18   SpO2 94%  Vital signs in last 24 hours: Temp:  [98.3 F (36.8 C)-100.3 F (37.9 C)] 98.4 F (36.9 C) (10/16 1216) Pulse Rate:  [70-84] 73 (10/16 1216) Resp:  [16-20] 18 (10/16 1216) BP: (106-163)/(51-78) 129/58 (10/16 1216) SpO2:  [94 %-98 %] 94 % (10/16 1216) FiO2 (%):  [40 %] 40 % (10/16 0741)  Physical Exam  Constitutional: Appears well-developed and well-nourished.  Psych: Affect appropriate to situation Eyes: No scleral injection HENT: Left incision site from CEA Head: Normocephalic.  Cardiovascular: Normal rate and regular rhythm.  Respiratory: Effort normal, non-labored breathing GI: Soft.  No distension. There is no tenderness.  Skin: Left neck incision site from CEA  Neuro: Mental Status: Patient is awake, alert, oriented to person, place, month, year, and situation. Patient is able to give a clear and coherent history. No signs  of aphasia or neglect Cranial Nerves: II: Visual Fields are full..   III,IV, VI: EOMI without ptosis or diplopia.  Pupils are equal, round, and reactive to light V: Facial sensation is symmetric to temperature VII: Facial movement is symmetric.  VIII: hearing is intact to voice X: Uvula elevates symmetrically XI: Shoulder shrug is symmetric. XII: tongue is midline without atrophy or fasciculations.  Motor: Tone is normal. Bulk is normal. 5/5 strength was present in all four extremities.  Significant negative myoclonus (asterixis) was noted in both bilateral upper and lower extremities Sensory: Sensation is symmetric to light touch and temperature in the arms and legs. Deep Tendon Reflexes: Right knee DTR with trace reflex; no deep tendon reflex on the left knee secondary likely to prior knee surgery, bilateral upper extremity 1+ deep tendon reflexes Plantars: Toes are downgoing bilaterally.  Cerebellar: FNF and HKS manifest with prominent asterixis; however, no cerebellar ataxia is seen  Labs I have reviewed labs  in epic and the results pertinent to this consultation are:   CBC    Component Value Date/Time   WBC 17.3 (H) 11/19/2017 0410   RBC 4.38 11/19/2017 0410   HGB 11.7 (L) 11/19/2017 0410   HGB 13.7 10/16/2016 0938   HCT 41.7 11/19/2017 0410   HCT 41.6 10/16/2016 0938   PLT 290 11/19/2017 0410   PLT 307 10/16/2016 0938   MCV 95.2 11/19/2017 0410   MCV 86 10/16/2016 0938   MCH 26.7 11/19/2017 0410   MCHC 28.1 (L) 11/19/2017 0410   RDW 14.7 11/19/2017 0410   RDW 14.6 10/16/2016 0938   LYMPHSABS 1.3 06/26/2016 0852   MONOABS 1.4 (H) 06/26/2016 0852   EOSABS 0.7 06/26/2016 0852   BASOSABS 0.1 06/26/2016 0852    CMP     Component Value Date/Time   NA 134 (L) 11/20/2017 0316   NA 142 10/16/2016 0938   K 5.2 (H) 11/20/2017 0316   CL 98 11/20/2017 0316   CO2 25 11/20/2017 0316   GLUCOSE 243 (H) 11/20/2017 0316   BUN 46 (H) 11/20/2017 0316   BUN 27 10/16/2016 0938   CREATININE 2.66 (H) 11/20/2017 0316   CALCIUM 8.6 (L) 11/20/2017 0316   PROT 6.8 11/12/2017 1429   ALBUMIN 3.1 (L) 11/12/2017 1429   AST 25 11/12/2017 1429   ALT 25 11/12/2017 1429   ALKPHOS 97 11/12/2017 1429   BILITOT 0.5 11/12/2017 1429   GFRNONAA 23 (L) 11/20/2017 0316   GFRAA 27 (L) 11/20/2017 0316    Lipid Panel  No results found for: CHOL, TRIG, HDL, CHOLHDL, VLDL, LDLCALC, LDLDIRECT   Imaging I have reviewed the images obtained:  CT-scan of the brain Small area of left parietal lobe sensory strip age indeterminate ischemia. No other changes of acute cortically based infarct identified. ASPECTS is 9.  Felicie Morn PA-C [facility] [phone] M-F  (9:00 am- 5:00 PM) 11/20/2017, 2:37 PM    Assessment: 67 year old male status post left CEA 2 days ago.  Code Stroke was called for right-sided weakness that was transient, in the setting of persistent post-op confusion and renal insufficiency.   1. Given prominent asterixis as well as confusion on exam, the most likely component of the DDx  is metabolic encephalopathy (renal insufficiency with possible transient hypercarbic/hypoxic encephalopathy). Transient hypoperfusion and TIA are possible, but felt to be less likely. If a TIA, then most likely source would be the site of the recent endarterectomy.  2. Patient was not a tPA candidate secondary to recent surgery in addition  to rapid improvement and more likely etiology for his acute worsening being metabolic encephalopathy.  3. Patient also was not an interventional candidate secondary to minimal symptoms.   Recommendations: # MRI of the brain without contrast # continue Aggrenox  # continue Atorvastatin  # BP goal: Per surgery # Telemetry monitoring # Frequent neuro checks #Discontue sedating medications -- robitussin, Oxycodone and Dilaudid if possible.  # NPO until passes stroke swallow screen # Vascular surgery to consider a follow up high spatial resolution left carotid ultrasound to assess for possible focal source of thrombus/embolus.  # Consider obtaining an ABG # please page stroke NP  Or  PA  Or MD from 8am -4 pm  as this patient from this time will be  followed by the stroke.   You can look them up on [email or web address]  Password [email or web address]  I have seen and examined the patient. I have formulated/amended the assessment and recommendations above.  Electronically signed: Dr. Caryl Pina
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
## 2017-11-20 NOTE — Consult Note (Addendum)
PULMONARY / CRITICAL CARE MEDICINE   NAME:  Justin Bartlett, MRN:  [id number], DOB:  11-Sep-1950, LOS: 2 ADMISSION DATE:  11/18/2017, CONSULTATION DATE: 11/20/2017 REFERRING MD: Vascular surgery, CHIEF COMPLAINT: Hypoxic  BRIEF HISTORY:    67 year old male who had left carotid endarterectomy 11/18/2017 with subsequent choking and questional aspiration and hypoxia. HISTORY OF PRESENT ILLNESS   67 year old male with a plethora of health issues that include coronary bypass grafting with repeat cardiac cath showing severe native vessel disease.  He is being treated medically he has had a high-grade left carotid stenosis and underwent left carotid endarterectomy on 11/18/2017.  He was doing well post surgical was eating choking and became hypoxic. He has a history of stage II chronic renal disease, insulin dependent diabetic, peripheral vascular disease congestive heart failure which she is on daily Lasix. SIGNIFICANT PAST MEDICAL HISTORY   Well documented below  SIGNIFICANT EVENTS:  11/18/2017 left carotid endarterectomy 11/19/2017 choking episode while eating 15 2019 hypoxia post choking episode with suspected aspiration STUDIES:    CULTURES:    ANTIBIOTICS:    LINES/TUBES:    CONSULTANTS:  11/20/2017 pulmonary critical care SUBJECTIVE:  67 year old male currently on Ventimask but no acute respiratory distress.  CONSTITUTIONAL: BP (!) 151/71 (BP Location: Left Arm)   Pulse 84   Temp 99.4 F (37.4 C) (Oral)   Resp 18   SpO2 96%   I/O last 3 completed shifts: In: 1450 [P.O.:1350; IV Piggyback:100] Out: 2250 [Urine:2250]     FiO2 (%):  [40 %] 40 %  PHYSICAL EXAM: General: Obese male in no acute distress currently on Ventimask at 40% satting 97% Neuro: Awake alert moves all extremities to command.  No focal defects but he does have a strange affect and reports talking to his sister who is passed away. HEENT: Left carotid endarterectomy scar well approximated, no JVD or  lymphadenopathy is appreciated Cardiovascular: Sounds are regular regular rate and rhythm Lungs: Decreased breath sounds in the bases faint crackles Abdomen: Obese, soft nontender Musculoskeletal: Intact Skin: Dry  RESOLVED PROBLEM LIST   ASSESSMENT AND PLAN   Hypoxia postoperative day #2 from left carotid endarterectomy with choking episode on 11/19/2017 which led to desaturations, increased choking and coughing.  Note he did receive oxycodone 10 mg during the night. Suspected aspiration Questionable pulmonary embolus Wean FiO2 as needed Pulmonary toilet with flutter valve incentive spirometer Mobilize as tolerated If decompensated moved to the intensive care unit. No antibiotics at this time for suspected aspiration Swallowing evaluation planned Chronic kidney disease precludes CTA Can entertain the idea of nuclear medicine perfusion scan to ro pe. Agree with lower extremity Doppler studies which were negative.  Status post left carotid enterectomy Coronary artery disease with coronary bypass graft in 2000 with subsequent cardiac cath showing severe three-vessel disease Congestive heart failure with grade 2 diastolic dysfunction with a EF of 55 to 60% on 04/30/1998  Continue with current diuresis Consider additional dose of diuretic Surgical interventions per vascular surgery  Chronic kidney disease 2 base creatinine 2.2 Hyperkalemia potassium 5.2 on 11/21/1998  Reported to be followed by nephrology Questionable if he can tolerate increased diuresis Monitor renal function and electrolytes  Insulin-dependent diabetic poorly controlled CBG (last 3)  Recent Labs    11/19/17 1629 11/19/17 2200 11/20/17 0639  GLUCAP 204* 207* 247*    Sliding scale insulin He is currently n.p.o. therefore insulin at this time  Altered mental status with strange effect This could be the effects of sensory overload coupled with narcotics.  Or could be effects of vascular surgery on  carotid.  No overt neurological deficit he just has a strange affect.  Monitor neurologic status If worsens consider radiological imaging for further evaluation and treatment and/or neurological consult   SUMMARY OF TODAY'S PLAN:  Wean oxygen as tolerated. Pulmonary toilet with flutter valve incentive spirometer Mobilizing out of bed Swallow evaluation planned for today If he decompensates will need to go to the intensive care unit  Best Practice / Goals of Care / Disposition.   DVT PROPHYLAXIS: Lovenox SUP: Protonix NUTRITION: Currently n.p.o. until he has swallowing evaluation MOBILITY: Should get out of bed as tolerated GOALS OF CARE: Currently full code FAMILY DISCUSSIONS: Patient and wife updated at bedside 11/20/2016 DISPOSITION currently in stepdown unit with no urgent need to move to intensive care unit at this time.  LABS  Glucose Recent Labs  Lab 11/18/17 2308 11/19/17 0628 11/19/17 1121 11/19/17 1629 11/19/17 2200 11/20/17 0639  GLUCAP 205* 125* 165* 204* 207* 247*    BMET Recent Labs  Lab 11/18/17 1231 11/19/17 0410 11/20/17 0316  NA  --  139 134*  K  --  5.1 5.2*  CL  --  107 98  CO2  --  26 25  BUN  --  36* 46*  CREATININE 2.16* 2.51* 2.66*  GLUCOSE  --  146* 243*    Liver Enzymes No results for input(s): AST, ALT, ALKPHOS, BILITOT, ALBUMIN in the last 168 hours.  Electrolytes Recent Labs  Lab 11/19/17 0410 11/20/17 0316  CALCIUM 8.2* 8.6*    CBC Recent Labs  Lab 11/18/17 1231 11/19/17 0410  WBC 14.5* 17.3*  HGB 11.6* 11.7*  HCT 41.0 41.7  PLT 282 290    ABG No results for input(s): PHART, PCO2ART, PO2ART in the last 168 hours.  Coag's No results for input(s): APTT, INR in the last 168 hours.  Sepsis Markers No results for input(s): LATICACIDVEN, PROCALCITON, O2SATVEN in the last 168 hours.  Cardiac Enzymes No results for input(s): TROPONINI, PROBNP in the last 168 hours.  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  PAST SURGICAL HISTORY:  He  has a past surgical history that includes Cardiovascular stress test (06/2008); ERCP (06/13/2011); sphincterotomy (06/13/2011); biliary stent placement (06/13/2011); ERCP (08/22/2011); ERCP (10/24/2011); spyglass lithotripsy (10/24/2011); Vasectomy (1995); Esophagogastroduodenoscopy; Colonoscopy; Cholecystectomy (01/21/2012); Cholecystectomy (01/21/2012); Intraoperative cholangiogram (01/21/2012); left heart catheterization with coronary/graft angiogram (N/A, 04/29/2014); Total hip arthroplasty (Left, 07/04/2016); Endarterectomy (Left, 11/18/2017); Patch angioplasty (Left, 11/18/2017); Joint replacement; Patella fracture surgery (Bilateral, 12/1998); Coronary artery bypass graft (2001); Fracture surgery; Ankle fracture surgery (Left, 1982); Cataract extraction w/ intraocular lens  implant, bilateral (Bilateral); Incision and drainage abscess (08/2003); and Cardiac catheterization (02/09/1999).  Allergies  Allergen Reactions  . Actos [Pioglitazone] Other (See Comments)    "Elevated kidney function"  . Hctz [Hydrochlorothiazide] Other (See Comments)    "BP issues"    No current facility-administered medications on file prior to encounter.    Current Outpatient Medications on File Prior to Encounter  Medication Sig  . atorvastatin (LIPITOR) 40 MG tablet TAKE 1 TABLET(40 MG) BY MOUTH DAILY (Patient taking differently: Take 40 mg by mouth daily. )  .  Cholecalciferol (VITAMIN D-3) 1000 units CAPS Take 3,000 Units by mouth daily.  Marland Kitchen dipyridamole-aspirin (AGGRENOX) 200-25 MG per 12 hr  capsule Take 1 capsule by mouth 2 (two) times daily.  Marland Kitchen docusate sodium (COLACE) 100 MG capsule Take 100 mg by mouth 2 (two) times daily.  . furosemide (LASIX) 80 MG tablet Take 1 tablet (80 mg total) by mouth daily.  Marland Kitchen HUMULIN R U-500 KWIKPEN 500 UNIT/ML injection Inject 70-90 Units into the skin See admin instructions. Inject 90 units into the skin before breakfast and 70 units before supper  . hydrALAZINE (APRESOLINE) 100 MG tablet Take 1 tablet (100 mg total) by mouth 3 (three) times daily.  . isosorbide mononitrate (IMDUR) 120 MG 24 hr tablet TAKE 1 TABLET(120 MG) BY MOUTH DAILY (Patient taking differently: Take 120 mg by mouth daily. )  . MEGARED OMEGA-3 KRILL OIL PO Take 1 capsule by mouth daily.   . metoprolol (LOPRESSOR) 100 MG tablet Take 100 mg by mouth 2 (two) times daily.   . Multiple Vitamin (MULTIVITAMIN WITH MINERALS) TABS tablet Take 1 tablet by mouth daily.  . Insulin Syringe-Needle U-100 (INSULIN SYRINGE .3CC/29GX1/2") 29G X 1/2" 0.3 ML MISC Inject as directed as directed.  Marland Kitchen tiZANidine (ZANAFLEX) 2 MG tablet Take 1 tablet (2 mg total) by mouth every 6 (six) hours as needed for muscle spasms.    FAMILY HISTORY:   His family history includes Cancer in his mother; Colon cancer in his mother; Diabetes in his sister; Heart attack in his father; Hypertension in his father and sister.  SOCIAL HISTORY:  He  reports that he has never smoked. He has never used smokeless tobacco. He reports that he drank alcohol. He reports that he does not use drugs.  REVIEW OF SYSTEMS:    10 point review of system taken, please see HPI for positives and negatives.    Brett Canales Santiaga Butzin ACNP [facility] Pager [phone] till 1 pm If no answer page [phone] 11/20/2017, 9:43 AM

## 2017-11-20 NOTE — Progress Notes (Signed)
[facility] [place]  Subjective  - POD#2 s/p L CEA.  Was doing well in am yesterday and then had choking event yesterday afternoon with chicken and discharge delayed.  8 L venturi mask this am.    Objective (!) 151/71 84 99.4 F (37.4 C) (Oral) 18 96%  Intake/Output Summary (Last 24 hours) at 11/20/2017 0831 Last data filed at 11/20/2017 0457 Gross per 24 hour  Intake 840 ml  Output 1950 ml  Net -1110 ml    General Resting in bed 8L venturi mask Left neck incision ok CN II-XII grossly intact No neuro deficits Bruising around neck incision, no hematoma appreciable  Laboratory Lab Results: Recent Labs    11/18/17 1231 11/19/17 0410  WBC 14.5* 17.3*  HGB 11.6* 11.7*  HCT 41.0 41.7  PLT 282 290   BMET Recent Labs    11/19/17 0410 11/20/17 0316  NA 139 134*  K 5.1 5.2*  CL 107 98  CO2 26 25  GLUCOSE 146* 243*  BUN 36* 46*  CREATININE 2.51* 2.66*  CALCIUM 8.2* 8.6*    COAG Lab Results  Component Value Date   INR 1.08 11/12/2017   INR 1.00 06/26/2016   INR 1.20 04/28/2014   No results found for: PTT  Assessment/Planning: POD #2 s/p L CEA.  Now  8 L venturi mask and coughing all night.  Will delay discharge.  CXR this am.  Will ask speech to evaluate swallowing - concern he could have aspirated.  Also ask pulmonology and renal to assist given rising Cr and oxygen requirement.  Significant co-morbidities.  Asked him to get OOB this am and work on pulmonary toilet.     Cephus Shelling 11/20/2017 8:31 AM --

## 2017-11-21 ENCOUNTER — Inpatient Hospital Stay (HOSPITAL_COMMUNITY): Payer: Medicare Other

## 2017-11-21 DIAGNOSIS — J9601 Acute respiratory failure with hypoxia: Secondary | ICD-10-CM

## 2017-11-21 LAB — GLUCOSE, CAPILLARY
GLUCOSE-CAPILLARY: 146 mg/dL — AB (ref 70–99)
Glucose-Capillary: 130 mg/dL — ABNORMAL HIGH (ref 70–99)
Glucose-Capillary: 129 mg/dL — ABNORMAL HIGH (ref 70–99)
Glucose-Capillary: 149 mg/dL — ABNORMAL HIGH (ref 70–99)

## 2017-11-21 LAB — BASIC METABOLIC PANEL
Anion gap: 7 (ref 5–15)
BUN: 51 mg/dL — AB (ref 8–23)
CALCIUM: 8.6 mg/dL — AB (ref 8.9–10.3)
CO2: 30 mmol/L (ref 22–32)
Chloride: 99 mmol/L (ref 98–111)
Creatinine, Ser: 2.33 mg/dL — ABNORMAL HIGH (ref 0.61–1.24)
GFR calc Af Amer: 32 mL/min — ABNORMAL LOW (ref >60)
GFR, EST NON AFRICAN AMERICAN: 27 mL/min — AB (ref >60)
GLUCOSE: 134 mg/dL — AB (ref 70–99)
Potassium: 4.1 mmol/L (ref 3.5–5.1)
SODIUM: 136 mmol/L (ref 135–145)

## 2017-11-21 LAB — PARATHYROID HORMONE, INTACT (NO CA): PTH: 59 pg/mL (ref 15–65)

## 2017-11-21 MED ORDER — FAMOTIDINE 20 MG PO TABS
20.0000 mg | ORAL_TABLET | Freq: Every day | ORAL | Status: DC
  Administered 2017-11-21 – 2017-11-22 (×2): 20 mg via ORAL
  Filled 2017-11-21 (×2): qty 1

## 2017-11-21 MED ORDER — FUROSEMIDE 80 MG PO TABS
160.0000 mg | ORAL_TABLET | Freq: Two times a day (BID) | ORAL | Status: DC
  Administered 2017-11-21 – 2017-11-22 (×3): 160 mg via ORAL
  Filled 2017-11-21 (×3): qty 2

## 2017-11-21 MED ORDER — HYDRALAZINE HCL 50 MG PO TABS
50.0000 mg | ORAL_TABLET | Freq: Three times a day (TID) | ORAL | Status: DC
  Administered 2017-11-21 – 2017-11-22 (×3): 50 mg via ORAL
  Filled 2017-11-21 (×3): qty 1

## 2017-11-21 NOTE — Progress Notes (Signed)
Modified Barium Swallow Progress Note  Patient Details  Name: Justin Bartlett MRN: [id number] Date of Birth: 1950/09/13  Today's Date: 11/21/2017  Modified Barium Swallow completed.  Full report located under Chart Review in the Imaging Section.  Brief recommendations include the following:  Clinical Impression  Pt demonstrated mild pharyngeal dysphagia characterized by penetration of thin into laryngeal vestibule believed secondary to pharyngeal edema (caused by recent surgery). Chin tuck postural maneuver was effective to provide additional airway protection/closure with thin, with only 1 instance of flash penetration. Although all instrusions to the vestibule was ejected during the swallow, slightly deeper and more frequent penetration was observed with thin without use of a chin tuck. Penetration to the vocal cords without sensation noted with thin bolus delivered via straw. No oral or pharyngeal impairements noted during deglutition of puree or regular textures. Recommend regular texture diet, thin liquids using a chin tuck, meds whole in puree as an added precaution, no straws,  and ST will continue to follow acutely to provide further education and treatment wtih diet safety and efficiency.   Swallow Evaluation Recommendations       SLP Diet Recommendations: Regular solids;Thin liquid   Liquid Administration via: Cup;No straw   Medication Administration: Whole meds with puree   Supervision: Patient able to self feed   Compensations: Small sips/bites;Slow rate   Postural Changes: Seated upright at 90 degrees   Oral Care Recommendations: Oral care BID        Royce Macadamia 11/21/2017,2:06 PM   Breck Coons Lonell Face.Ed Nurse, children's [phone] Office [phone]

## 2017-11-21 NOTE — Progress Notes (Addendum)
Progress Note    11/21/2017 7:13 AM 3 Days Post-Op  Subjective:  Sitting in chair; alert this morning-says he feels better; says he feels like his swallowing is better  Tm 99 now afebrile HR 50's-70's NSR 110's-130's systolic 97% 1LO2NC  Vitals:   11/21/17 0445 11/21/17 0610  BP:    Pulse: (!) 57 (!) 59  Resp: 15 15  Temp:    SpO2: 94% 96%    Physical Exam: General:  No distress Lungs:  Non labored Incisions:  Clean and dry; still with ecchymosis Extremities:  Moving all extremities equally; tongue is midline   CBC    Component Value Date/Time   WBC 17.3 (H) 11/19/2017 0410   RBC 4.38 11/19/2017 0410   HGB 11.7 (L) 11/19/2017 0410   HGB 13.7 10/16/2016 0938   HCT 41.7 11/19/2017 0410   HCT 41.6 10/16/2016 0938   PLT 290 11/19/2017 0410   PLT 307 10/16/2016 0938   MCV 95.2 11/19/2017 0410   MCV 86 10/16/2016 0938   MCH 26.7 11/19/2017 0410   MCHC 28.1 (L) 11/19/2017 0410   RDW 14.7 11/19/2017 0410   RDW 14.6 10/16/2016 0938   LYMPHSABS 1.3 06/26/2016 0852   MONOABS 1.4 (H) 06/26/2016 0852   EOSABS 0.7 06/26/2016 0852   BASOSABS 0.1 06/26/2016 0852    BMET    Component Value Date/Time   NA 136 11/21/2017 0449   NA 142 10/16/2016 0938   K 4.1 11/21/2017 0449   CL 99 11/21/2017 0449   CO2 30 11/21/2017 0449   GLUCOSE 134 (H) 11/21/2017 0449   BUN 51 (H) 11/21/2017 0449   BUN 27 10/16/2016 0938   CREATININE 2.33 (H) 11/21/2017 0449   CALCIUM 8.6 (L) 11/21/2017 0449   GFRNONAA 27 (L) 11/21/2017 0449   GFRAA 32 (L) 11/21/2017 0449    INR    Component Value Date/Time   INR 1.08 11/12/2017 1429     Intake/Output Summary (Last 24 hours) at 11/21/2017 0713 Last data filed at 11/21/2017 0500 Gross per 24 hour  Intake 486.39 ml  Output 2425 ml  Net -1938.61 ml   CT head 11/20/17: IMPRESSION: 1. Small area of left parietal lobe sensory strip age indeterminate ischemia. No other changes of acute cortically based infarct identified. ASPECTS  is 9. 2. Progressed and moderate for age cerebral white matter disease since 2005. 3. Progressed bilateral middle ear and mastoid opacification. Consider otitis media. 4. These results were communicated to Dr. Otelia Limes at 2:26 pmon 10/16/2019by text page via the [facility] messaging system.  Mri 11/20/17: IMPRESSION: 1. No acute intracranial abnormality. 2. Chronic ischemic microangiopathy and generalized volume loss.  Assessment:  67 y.o. male is s/p:  Left CEA  3 Days Post-Op  Plan: -pt doing much better this am - MRI unremarkable for acute stroke -pt for swallow study today -renal function improved  -pulm:   He is down to 1LO2NC and walked 118ft with 2L and did not desaturate.  -continue mobilization   Doreatha Massed, PA-C [facility] [phone] 11/21/2017   I have seen and evaluated the patient. I agree with the PA note as documented above. Looks much better today.  Weaned to 2 L [place] from 8 L [place] with diuresis.  Cr back to baseline.  MRI last night with no evidence of acute stroke.  A&Ox3 and no deficits.  Continue aggrenox.  Swallow study today but tolerating thickened liquids ok.  Appreciate assistance from nephrology, pulmonology, and speech.   Cephus Shelling,  MD [facility] [place] Office: [phone] Pager: [place]

## 2017-11-21 NOTE — Progress Notes (Signed)
  Speech Language Pathology   Patient Details Name: Justin Bartlett MRN: [id number] DOB: 09-29-1950 Today's Date: 11/21/2017 Time:  -     MBS today at 10:30                        Royce Macadamia 11/21/2017, 8:58 AM   Breck Coons Lonell Face.Ed Nurse, children's [phone] Office [phone]

## 2017-11-21 NOTE — Consult Note (Signed)
PULMONARY / CRITICAL CARE MEDICINE   NAME:  Justin Bartlett, MRN:  [id number], DOB:  1950/04/20, LOS: 3 ADMISSION DATE:  11/18/2017, CONSULTATION DATE: 11/20/2017 REFERRING MD: Vascular surgery, CHIEF COMPLAINT: Hypoxic  BRIEF HISTORY:    67 year old male who had left carotid endarterectomy 11/18/2017 with subsequent choking and questional aspiration and hypoxia. HISTORY OF PRESENT ILLNESS   67 year old male with a plethora of health issues that include coronary bypass grafting with repeat cardiac cath showing severe native vessel disease.  He is being treated medically he has had a high-grade left carotid stenosis and underwent left carotid endarterectomy on 11/18/2017.  He was doing well post surgical was eating choking and became hypoxic.  SIGNIFICANT PAST MEDICAL HISTORY   He has a history of stage II chronic renal disease, insulin dependent diabetic, peripheral vascular disease congestive heart failure which she is on daily Lasix.  SIGNIFICANT EVENTS:  11/18/2017 left carotid endarterectomy 11/19/2017 choking episode while eating 11/19/2017 hypoxia post choking episode with suspected aspiration STUDIES:   SLP evaluation from 10/16: showed signs of decreased airway protection. For MBS today.  CULTURES:  N/A  ANTIBIOTICS:  N/A  LINES/TUBES:  PIV  CONSULTANTS:  11/20/2017 pulmonary critical care for hypoxia 11/20/2017 neurology for confusion and right arm weakness. No evidence of acute stroke. SUBJECTIVE:  Patient states that his breathing and swallowing have improved considerably since yesterday and he is able to swallow even a modified diet better than he did previously.   CONSTITUTIONAL: BP (!) 131/55 (BP Location: Left Arm)   Pulse (!) 59   Temp 98.2 F (36.8 C) (Oral)   Resp 15   Ht 5\' 11"  (1.803 m)   Wt 120.1 kg   SpO2 96%   BMI 36.93 kg/m   I/O last 3 completed shifts: In: 486.4 [P.O.:420; IV Piggyback:66.4] Out: 3075 [Urine:3075]        PHYSICAL  EXAM: General: Obese male in no acute distress and now on only 1 Lpm via [place]. Neuro: Awake alert. No cranial nerve deficits: normal sensation, no facial asymmetry, symmetric palatal elevation, normal head strength, phonation is normal, no dysarthria. Full strength in the extremities. HEENT: Left carotid endarterectomy scar well approximated, with some fullness and bruising.  Cardiovascular:No JVD, HS are normal. Lungs: normal vesicular breath sounds with no adventitial sounds  Abdomen: Obese, soft nontender Musculoskeletal: Intact Skin: Dry  RESOLVED PROBLEM LIST   ASSESSMENT AND PLAN   Hypoxia postoperative day #2 from left carotid endarterectomy with choking episode on 11/19/2017 which led to desaturations, increased choking and coughing.   Has spontaneously improved overnight suggesting transient swallowing dysfunction. Continue to wean O2 as tolerated. Usual post operative pulmonary toilette with progressive ambulation. Continue modified diet as per SLP, follow up results of MBS.  Status post left carotid enterectomy Coronary artery disease with coronary bypass graft in 2000 with subsequent cardiac cath showing severe three-vessel disease Congestive heart failure with grade 2 diastolic dysfunction with a EF of 55 to 60% on 04/30/1998 Continue with home diuretic regimen Surgical interventions per vascular surgery  Chronic kidney disease 2 base creatinine 2.2 Renal function is stable and tolerating diuresis.  Insulin-dependent diabetic poorly controlled CBG (last 3)  Glucose control has improved, but oral intake is minimal.  Will need to adjust insulin regimen as patient begins to eat.  Altered mental status with strange affect yesterday has resolved. Suspect was due to hypoxia and narcotic pain medications.   SUMMARY OF TODAY'S PLAN:  Wean oxygen as tolerated. Pulmonary toilet with flutter valve incentive  spirometer Mobilizing out of bed Swallow evaluation planned for  today  At this point patient is improving as expected. PCCM has no further recommendations. Will sign off. Please contact if further questions arise.  Best Practice / Goals of Care / Disposition.   DVT PROPHYLAXIS: Lovenox SUP: Protonix NUTRITION: Currently n.p.o. until he has swallowing evaluation MOBILITY: Should get out of bed as tolerated GOALS OF CARE: Currently full code FAMILY DISCUSSIONS: Patient and wife updated at bedside 11/20/2016 DISPOSITION currently in stepdown unit and can progress to normal care.  LABS  Glucose Recent Labs  Lab 11/20/17 0639 11/20/17 1141 11/20/17 1709 11/20/17 1928 11/20/17 2124 11/21/17 0613  GLUCAP 247* 212* 226* 217* 204* 130*    BMET Recent Labs  Lab 11/19/17 0410 11/20/17 0316 11/21/17 0449  NA 139 134* 136  K 5.1 5.2* 4.1  CL 107 98 99  CO2 26 25 30   BUN 36* 46* 51*  CREATININE 2.51* 2.66* 2.33*  GLUCOSE 146* 243* 134*    Liver Enzymes No results for input(s): AST, ALT, ALKPHOS, BILITOT, ALBUMIN in the last 168 hours.  Electrolytes Recent Labs  Lab 11/19/17 0410 11/20/17 0316 11/21/17 0449  CALCIUM 8.2* 8.6* 8.6*    CBC Recent Labs  Lab 11/18/17 1231 11/19/17 0410  WBC 14.5* 17.3*  HGB 11.6* 11.7*  HCT 41.0 41.7  PLT 282 290    ABG No results for input(s): PHART, PCO2ART, PO2ART in the last 168 hours.  Coag's No results for input(s): APTT, INR in the last 168 hours.   Lynnell Catalan, MD [employer] ICU Physician [facility] [place] [facility]  Pager: [phone] Mobile: [phone] After hours: [phone].  11/21/2017, 7:50 AM

## 2017-11-21 NOTE — Care Management Important Message (Signed)
Important Message  Patient Details  Name: Justin Bartlett MRN: [id number] Date of Birth: Oct 22, 1950   Medicare Important Message Given:  Yes    Samarie Pinder P Nyelli Samara 11/21/2017, 11:46 AM

## 2017-11-21 NOTE — Progress Notes (Signed)
Subjective: Interval History: has no complaint , a lot better.  Objective: Vital signs in last 24 hours: Temp:  [98.1 F (36.7 C)-99 F (37.2 C)] 98.2 F (36.8 C) (10/17 0424) Pulse Rate:  [57-73] 59 (10/17 0610) Resp:  [13-19] 15 (10/17 0610) BP: (115-157)/(55-77) 131/55 (10/17 0424) SpO2:  [94 %-97 %] 96 % (10/17 0610) Weight:  [120.1 kg-126.3 kg] 120.1 kg (10/17 0455) Weight change:   Intake/Output from previous day: 10/16 0701 - 10/17 0700 In: 486.4 [P.O.:420; IV Piggyback:66.4] Out: 2425 [Urine:2425] Intake/Output this shift: No intake/output data recorded.  General appearance: alert, cooperative, no distress, moderately obese and sitting in chair on 2 L Neck: bruising, incision on L Resp: diminished breath sounds bilaterally and rales bibasilar Cardio: S1, S2 normal and systolic murmur: systolic ejection 2/6, crescendo and decrescendo at 2nd left intercostal space GI: obese, pos bs, liver down 5 cm Extremities: edema 2+  Lab Results: Recent Labs    11/18/17 1231 11/19/17 0410  WBC 14.5* 17.3*  HGB 11.6* 11.7*  HCT 41.0 41.7  PLT 282 290   BMET:  Recent Labs    11/20/17 0316 11/21/17 0449  NA 134* 136  K 5.2* 4.1  CL 98 99  CO2 25 30  GLUCOSE 243* 134*  BUN 46* 51*  CREATININE 2.66* 2.33*  CALCIUM 8.6* 8.6*   Recent Labs    11/20/17 1121  PTH 59   Iron Studies: No results for input(s): IRON, TIBC, TRANSFERRIN, FERRITIN in the last 72 hours.  Studies/Results: Dg Chest 2 View  Result Date: 11/20/2017 CLINICAL DATA:  Shortness of breath. History of coronary artery disease, NSTEMI, CABG. EXAM: CHEST - 2 VIEW COMPARISON:  PA and lateral chest x-ray of November 19, 2017 FINDINGS: The right lung is mildly hypoinflated in part due to a chronically elevated right hemidiaphragm. The left lung is well-expanded. The interstitial markings are increased. The pulmonary vascularity is engorged. The cardiac silhouette is enlarged. Patient has undergone previous  median sternotomy. The uppermost sternal wire is chronically broken. There is mild multilevel degenerative disc disease of the thoracic spine. IMPRESSION: CHF with mild pulmonary vascular congestion and interstitial edema. Chronic elevation of the right hemidiaphragm. Electronically Signed   By: David  [place] M.D.   On: 11/20/2017 09:41   Dg Chest 2 View  Result Date: 11/19/2017 CLINICAL DATA:  Dyspnea EXAM: CHEST - 2 VIEW COMPARISON:  10/26/2017 FINDINGS: Obscuration of the right hemidiaphragm and portions of the right heart border. This may be secondary to right basilar atelectasis and/or development of small right effusion superimposed on chronic elevation of the right hemidiaphragm. Mild crowding of interstitial lung markings due to low lung volumes. Heart size is mildly enlarged likely due to the AP upright projection. Median sternotomy sutures are in place. Aortic arch is not well visualized similar to prior. No acute osseous abnormality. IMPRESSION: Slight obscuration of the right hemidiaphragm and right heart border presumably from right basilar atelectasis and/or a small effusion superimposed on chronic elevation of the right hemidiaphragm. Crowding of interstitial lung markings without active pulmonary disease noted. Electronically Signed   By: Tollie Eth M.D.   On: 11/19/2017 13:30   Mr Brain Wo Contrast  Result Date: 11/20/2017 CLINICAL DATA:  Transient ischemic attack EXAM: MRI HEAD WITHOUT CONTRAST TECHNIQUE: Multiplanar, multiecho pulse sequences of the brain and surrounding structures were obtained without intravenous contrast. COMPARISON:  Head CT 11/20/2017 Brain MRI 10/17/2003 FINDINGS: BRAIN: No acute infarct. No hemorrhage or extra-axial collection. The midline structures are normal. No midline  shift or other mass effect. Early confluent hyperintense T2-weighted signal of the periventricular and deep white matter, most commonly due to chronic ischemic microangiopathy. Generalized  atrophy without lobar predilection. No hydrocephalus. There are approximately 5 scattered foci of chronic microhemorrhage in a nonspecific, predominantly peripheral distribution. VASCULAR: Major intracranial arterial and venous sinus flow voids are normal. SKULL AND UPPER CERVICAL SPINE: Calvarial bone marrow signal is normal. There is no skull base mass. Visualized upper cervical spine and soft tissues are normal. SINUSES/ORBITS: Bilateral mastoid effusions.  The orbits are normal. IMPRESSION: 1. No acute intracranial abnormality. 2. Chronic ischemic microangiopathy and generalized volume loss. Electronically Signed   By: Deatra Robinson M.D.   On: 11/20/2017 21:06   US Renal  Result Date: 11/20/2017 CLINICAL DATA:  67 year old male with acute renal injury. EXAM: RENAL / URINARY TRACT ULTRASOUND COMPLETE COMPARISON:  None. FINDINGS: Right Kidney: Length: 8.8 centimeters. Echogenicity within normal limits. No mass or hydronephrosis visualized. Left Kidney: Length: Estimated at 8.0 centimeters, not as well visualized as the right kidney. Echogenicity within normal limits. No mass or hydronephrosis visualized. Bladder: Appears normal for degree of bladder distention. Estimated bladder volume 286 milliliters. The patient was reportedly unable to void. IMPRESSION: 1. Distended urinary bladder with estimated volume 286 mL compatible with urinary retention. 2. No acute renal findings. Electronically Signed   By: Odessa Fleming M.D.   On: 11/20/2017 21:10   Ct Head Code Stroke Wo Contrast  Result Date: 11/20/2017 CLINICAL DATA:  Code stroke. 67 year old male with new right side weakness. EXAM: CT HEAD WITHOUT CONTRAST TECHNIQUE: Contiguous axial images were obtained from the base of the skull through the vertex without intravenous contrast. COMPARISON:  Brain MRI, head and neck MRA 10/17/2003. Head CT 10/16/2003. FINDINGS: Brain: Progressed bilateral nonspecific cerebral white matter hypodensity since 2005, moderate for  age now. There is an area of conspicuous cortical and subcortical white matter hypodensity at the left sensory strip on series 3, image 27. No other possible cytotoxic edema identified in the left hemisphere. No acute intracranial hemorrhage identified. No midline shift, mass effect, or evidence of intracranial mass lesion. No ventriculomegaly. No other cortical encephalomalacia. Vascular: Calcified atherosclerosis at the skull base. No suspicious intracranial vascular hyperdensity. Skull: No acute osseous abnormality identified. Sinuses/Orbits: Visualized paranasal sinuses and mastoids are stable and well pneumatized. However, bilateral mastoid effusions and tympanic cavity opacification is new or progressed since 2005 (minimal to mild mastoid effusions at that time). Subtotal opacification of the right tympanic cavity. Other: Postoperative changes to both globes since 2005. No acute scalp soft tissue finding. ASPECTS [facility] Stroke Program Early CT Score) - Ganglionic level infarction (caudate, lentiform nuclei, internal capsule, insula, M1-M3 cortex): 7 - Supraganglionic infarction (M4-M6 cortex): 2 Total score (0-10 with 10 being normal): 9 IMPRESSION: 1. Small area of left parietal lobe sensory strip age indeterminate ischemia. No other changes of acute cortically based infarct identified. ASPECTS is 9. 2. Progressed and moderate for age cerebral white matter disease since 2005. 3. Progressed bilateral middle ear and mastoid opacification. Consider otitis media. 4. These results were communicated to Dr. Otelia Limes at 2:26 pmon 10/16/2019by text page via the [facility] messaging system. Electronically Signed   By: Odessa Fleming M.D.   On: 11/20/2017 14:26    I have reviewed the patient's current medications.  Assessment/Plan: 1 CKD4/AKI improving Cr and vol.  Hypoxia better with diruesis.  Has some component obstruction on U/S. ?? Accuracy of bladder scans. Shrunken kidney c/w nephrosclerosis as primary injury. 2  Enceph improved with better O2 and less drugs 3 DM 4 Obesity 5 Anemia mild 6 CE 7 CAD 8 ^ Lipids 9 HTN lower vol , lower meds P po lasix, lower hydral,  Sugar control, diuresis. Monitor accuracy of bladder scans.    LOS: 3 days   Fayrene Fearing Zeke Aker 11/21/2017,8:30 AM

## 2017-11-22 LAB — RENAL FUNCTION PANEL
ALBUMIN: 2.6 g/dL — AB (ref 3.5–5.0)
Anion gap: 11 (ref 5–15)
BUN: 59 mg/dL — ABNORMAL HIGH (ref 8–23)
CHLORIDE: 97 mmol/L — AB (ref 98–111)
CO2: 31 mmol/L (ref 22–32)
Calcium: 8.7 mg/dL — ABNORMAL LOW (ref 8.9–10.3)
Creatinine, Ser: 2.13 mg/dL — ABNORMAL HIGH (ref 0.61–1.24)
GFR, EST AFRICAN AMERICAN: 35 mL/min — AB (ref >60)
GFR, EST NON AFRICAN AMERICAN: 30 mL/min — AB (ref >60)
Glucose, Bld: 119 mg/dL — ABNORMAL HIGH (ref 70–99)
PHOSPHORUS: 3.9 mg/dL (ref 2.5–4.6)
POTASSIUM: 3.7 mmol/L (ref 3.5–5.1)
Sodium: 139 mmol/L (ref 135–145)

## 2017-11-22 LAB — GLUCOSE, CAPILLARY
GLUCOSE-CAPILLARY: 147 mg/dL — AB (ref 70–99)
Glucose-Capillary: 258 mg/dL — ABNORMAL HIGH (ref 70–99)

## 2017-11-22 MED ORDER — METOPROLOL TARTRATE 50 MG PO TABS: 50.0000 mg | ORAL_TABLET | Freq: Two times a day (BID) | ORAL | Status: DC
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
## 2017-11-22 NOTE — Progress Notes (Signed)
  Speech Language Pathology Treatment: Dysphagia  Patient Details Name: Justin Bartlett MRN: [id number] DOB: [birth year] Today's Date: 11/22/2017 Time: 9390-3009 SLP Time Calculation (min) (ACUTE ONLY): 11 min  Assessment / Plan / Recommendation Clinical Impression  Justin Bartlett recalled chin tuck strategy without cueing as recommended post MBS yesterday. Needed min prompts to keep head at neutral when taking sip then tucking chin. He states significantly decreased coughing from several days ago likely due to decreased swelling and possible introduction to chin tuck. Oral mastication and transit swift with regular texture although he is cautious and takes small bites. Recommended he continue chin tuck with liquids over the weekend (discharging today) and slowly fade out chin tuck as tolerated. ST will sign off.    HPI HPI: Pt is 67 y.o. male, PMH significant for type 2 diabetes, stroke (2006), obesity, NSTEMI, HTN, stage 4 CKD, CHF, CAD. Per chart, he has high-grade left carotid stenosis and underwent left carotid endarterectomy on 11/18/2017, was doing well post-surgical and set to d/c until he became hypoxic from choking during eating, O2 sats 94-97%. Head CT small left parietal lobe sensory strip age indeterminate ischemia; no other acute cortical changes; progressed and moderate for age white matter disease since 2005. CXR CHF with mild pulmonary vascular congestion and interstitial edema; chronic elevation of the right hemidiaphragm.      SLP Plan  All goals met;Discharge SLP treatment due to (comment)       Recommendations  Diet recommendations: Regular;Thin liquid Liquids provided via: Cup;No straw Medication Administration: Crushed with puree Supervision: Patient able to self feed Compensations: Chin tuck(with thin) Postural Changes and/or Swallow Maneuvers: Seated upright 90 degrees                Oral Care Recommendations: Oral care BID Follow up Recommendations: None SLP Visit  Diagnosis: Dysphagia, pharyngeal phase (R13.13) Plan: All goals met;Discharge SLP treatment due to (comment)       GO                Houston Siren 11/22/2017, 11:33 AM  Orbie Pyo Colvin Caroli.Ed Risk analyst [phone] Office [phone]

## 2017-11-22 NOTE — Progress Notes (Signed)
  Progress Note    11/22/2017 11:00 AM 3 Days Post-Op  Subjective:  No acute events.  Weaned to room air.  Feels better.    Vitals:   11/22/17 0500 11/22/17 0852  BP: 133/66 (!) 95/57  Pulse: 71   Resp: 17 19  Temp: 98.7 F (37.1 C) 98 F (36.7 C)  SpO2: 97% 97%    Physical Exam: General:  No distress, resting in bed Lungs:  Non labored Incisions:  Clean and dry; still with ecchymosis to left neck Extremities:  Moving all extremities equally; tongue is midline; CN II-XII grossly intact   CBC    Component Value Date/Time   WBC 17.3 (H) 11/19/2017 0410   RBC 4.38 11/19/2017 0410   HGB 11.7 (L) 11/19/2017 0410   HGB 13.7 10/16/2016 0938   HCT 41.7 11/19/2017 0410   HCT 41.6 10/16/2016 0938   PLT 290 11/19/2017 0410   PLT 307 10/16/2016 0938   MCV 95.2 11/19/2017 0410   MCV 86 10/16/2016 0938   MCH 26.7 11/19/2017 0410   MCHC 28.1 (L) 11/19/2017 0410   RDW 14.7 11/19/2017 0410   RDW 14.6 10/16/2016 0938   LYMPHSABS 1.3 06/26/2016 0852   MONOABS 1.4 (H) 06/26/2016 0852   EOSABS 0.7 06/26/2016 0852   BASOSABS 0.1 06/26/2016 0852    BMET    Component Value Date/Time   NA 139 11/22/2017 0330   NA 142 10/16/2016 0938   K 3.7 11/22/2017 0330   CL 97 (L) 11/22/2017 0330   CO2 31 11/22/2017 0330   GLUCOSE 119 (H) 11/22/2017 0330   BUN 59 (H) 11/22/2017 0330   BUN 27 10/16/2016 0938   CREATININE 2.13 (H) 11/22/2017 0330   CALCIUM 8.7 (L) 11/22/2017 0330   GFRNONAA 30 (L) 11/22/2017 0330   GFRAA 35 (L) 11/22/2017 0330    INR    Component Value Date/Time   INR 1.08 11/12/2017 1429     Intake/Output Summary (Last 24 hours) at 11/22/2017 1100 Last data filed at 11/22/2017 0857 Gross per 24 hour  Intake 180 ml  Output 475 ml  Net -295 ml   CT head 11/20/17: IMPRESSION: 1. Small area of left parietal lobe sensory strip age indeterminate ischemia. No other changes of acute cortically based infarct identified. ASPECTS is 9. 2. Progressed and moderate  for age cerebral white matter disease since 2005. 3. Progressed bilateral middle ear and mastoid opacification. Consider otitis media. 4. These results were communicated to Dr. Otelia Limes at 2:26 pmon 10/16/2019by text page via the [facility] messaging system.  Mri 11/20/17: IMPRESSION: 1. No acute intracranial abnormality. 2. Chronic ischemic microangiopathy and generalized volume loss.  Assessment:  67 y.o. male is s/p:  Left CEA  4 Days Post-Op  Plan: Doing much better POD#4 s/p L CEA.  Weaned off O2 with diuresis and kidney numbers improved.  Speech has evaluated pat and ok for regular diet and thin liquids with chin tuck.  Plan for D/C home today on aggrenox.  Will follow-up in one month with duplex of carotid.    Cephus Shelling, MD [facility] [place] [place] Office: [phone] Pager: [phone]  Cephus Shelling

## 2017-11-22 NOTE — Plan of Care (Signed)
  Problem: Education: Goal: Knowledge of General Education information will improve Description Including pain rating scale, medication(s)/side effects and non-pharmacologic comfort measures Outcome: Adequate for Discharge   Problem: Health Behavior/Discharge Planning: Goal: Ability to manage health-related needs will improve Outcome: Adequate for Discharge   Problem: Clinical Measurements: Goal: Ability to maintain clinical measurements within normal limits will improve Outcome: Adequate for Discharge Goal: Will remain free from infection Outcome: Adequate for Discharge Goal: Diagnostic test results will improve Outcome: Adequate for Discharge Goal: Respiratory complications will improve Outcome: Adequate for Discharge Goal: Cardiovascular complication will be avoided Outcome: Adequate for Discharge   Problem: Activity: Goal: Risk for activity intolerance will decrease Outcome: Adequate for Discharge   Problem: Nutrition: Goal: Adequate nutrition will be maintained Outcome: Adequate for Discharge   Problem: Coping: Goal: Level of anxiety will decrease Outcome: Adequate for Discharge   Problem: Elimination: Goal: Will not experience complications related to bowel motility Outcome: Adequate for Discharge Goal: Will not experience complications related to urinary retention Outcome: Adequate for Discharge   Problem: Pain Managment: Goal: General experience of comfort will improve Outcome: Adequate for Discharge   Problem: Safety: Goal: Ability to remain free from injury will improve Outcome: Adequate for Discharge   Problem: Skin Integrity: Goal: Risk for impaired skin integrity will decrease Outcome: Adequate for Discharge   Problem: Clinical Measurements: Goal: Postoperative complications will be avoided or minimized Outcome: Adequate for Discharge   Problem: Skin Integrity: Goal: Demonstration of wound healing without infection will improve Outcome: Adequate  for Discharge   Problem: Metabolic: Goal: Ability to maintain appropriate glucose levels will improve Outcome: Adequate for Discharge

## 2017-11-25 ENCOUNTER — Encounter: Payer: Self-pay | Admitting: *Deleted

## 2017-11-25 NOTE — Congregational Nurse Program (Signed)
[id number] is now home and doing well/tired but getting strength back, saw while in hospital and was improving.  Did have some set back last week but seems to have recovered and is improving. Rhonda Davis,RN3,CN

## 2017-12-10 ENCOUNTER — Other Ambulatory Visit: Payer: Self-pay

## 2017-12-10 ENCOUNTER — Ambulatory Visit (INDEPENDENT_AMBULATORY_CARE_PROVIDER_SITE_OTHER): Payer: Self-pay | Admitting: Vascular Surgery

## 2017-12-10 ENCOUNTER — Encounter: Payer: Self-pay | Admitting: Vascular Surgery

## 2017-12-10 VITALS — BP 147/70 | HR 73 | Temp 97.6°F | Resp 18 | Ht 71.0 in | Wt 270.0 lb

## 2017-12-10 DIAGNOSIS — I6523 Occlusion and stenosis of bilateral carotid arteries: Secondary | ICD-10-CM
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
## 2017-12-11 ENCOUNTER — Ambulatory Visit (HOSPITAL_COMMUNITY)
Admission: RE | Admit: 2017-12-11 | Discharge: 2017-12-11 | Disposition: A | Discharge status: Home / Self Care | Payer: Medicare Other | Source: Ambulatory Visit | Attending: Vascular Surgery | Admitting: Vascular Surgery

## 2017-12-11 DIAGNOSIS — I6523 Occlusion and stenosis of bilateral carotid arteries: Secondary | ICD-10-CM | POA: Insufficient documentation

## 2017-12-20 ENCOUNTER — Other Ambulatory Visit: Payer: Self-pay | Admitting: Gastroenterology

## 2017-12-23 ENCOUNTER — Telehealth: Payer: Self-pay | Admitting: *Deleted

## 2017-12-23 ENCOUNTER — Telehealth: Payer: Self-pay | Admitting: Nurse Practitioner

## 2017-12-23 NOTE — Telephone Encounter (Signed)
[id number]/Graeme Menees,BSN,RN3,CCM,CN/tct-patient, call is for check up on recent hospital stay/per the patient incision is healing well and he is feeling almost back to normal.  Has been able to attend church and family activities without trouble.

## 2017-12-23 NOTE — Telephone Encounter (Signed)
Received phone call today from Dr. Randa EvensEdwards with [facility]. Justin Bartlett has had a positive stool for blood. Has +FH for colon cancer.   Justin Bartlett is high risk for all procedures based on last cath and has had multiple issues. He is on chronic Aggrenox for prior stroke. No virtual colonoscopy due to CKD.   Dr. Randa EvensEdwards is going to proceed with barium enema and sigmoidoscopy. If full colonoscopy is warranted - will be done in the hospital - and remains at increased risk.   Rosalio MacadamiaLori C. Malakhai Beitler, RN, ANP-C [facility] [address] [place], [place]  [address] [phone]

## 2017-12-25 ENCOUNTER — Other Ambulatory Visit: Payer: Self-pay | Admitting: Gastroenterology

## 2017-12-25 DIAGNOSIS — Z800 Family history of malignant neoplasm of digestive organs: Secondary | ICD-10-CM

## 2017-12-31 ENCOUNTER — Ambulatory Visit
Admission: RE | Admit: 2017-12-31 | Discharge: 2017-12-31 | Disposition: A | Discharge status: Home / Self Care | Payer: Medicare Other | Source: Ambulatory Visit | Attending: Gastroenterology | Admitting: Gastroenterology

## 2017-12-31 DIAGNOSIS — Z800 Family history of malignant neoplasm of digestive organs: Secondary | ICD-10-CM

## 2018-02-05 DEATH — deceased

## 2018-02-11 ENCOUNTER — Ambulatory Visit: Payer: Medicare Other | Admitting: Nurse Practitioner

## 2018-02-12 ENCOUNTER — Ambulatory Visit: Payer: Medicare Other | Admitting: Nurse Practitioner

## 2018-03-03 ENCOUNTER — Ambulatory Visit (HOSPITAL_COMMUNITY): Admit: 2018-03-03 | Payer: Medicare Other | Admitting: Gastroenterology

## 2018-03-03 ENCOUNTER — Encounter (HOSPITAL_COMMUNITY): Payer: Self-pay

## 2018-03-03 SURGERY — COLONOSCOPY WITH PROPOFOL
Anesthesia: Monitor Anesthesia Care

## 2018-06-17 ENCOUNTER — Ambulatory Visit: Payer: Medicare Other | Admitting: Vascular Surgery

## 2018-06-17 ENCOUNTER — Ambulatory Visit (HOSPITAL_COMMUNITY): Payer: Medicare Other

## 2019-02-26 IMAGING — RF DG SWALLOWING FUNCTION - NRPT [facility]
13 of 14 series · 13 of 24 positions shown · non-contrast
Comparison: none

[Series 1: run · 1 of 67 frames shown (1 of 13)]
[frame 11/67]
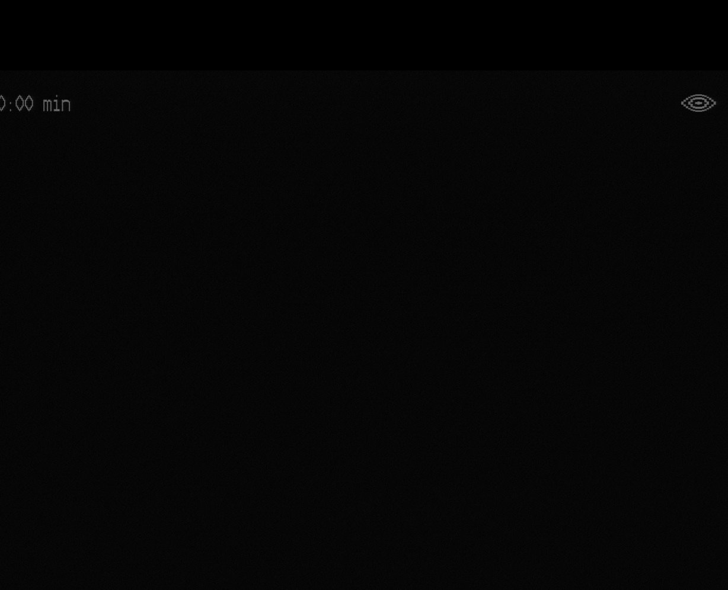

[Series 2: run · 1 of 43 frames shown (2 of 13)]
[frame 22/43]
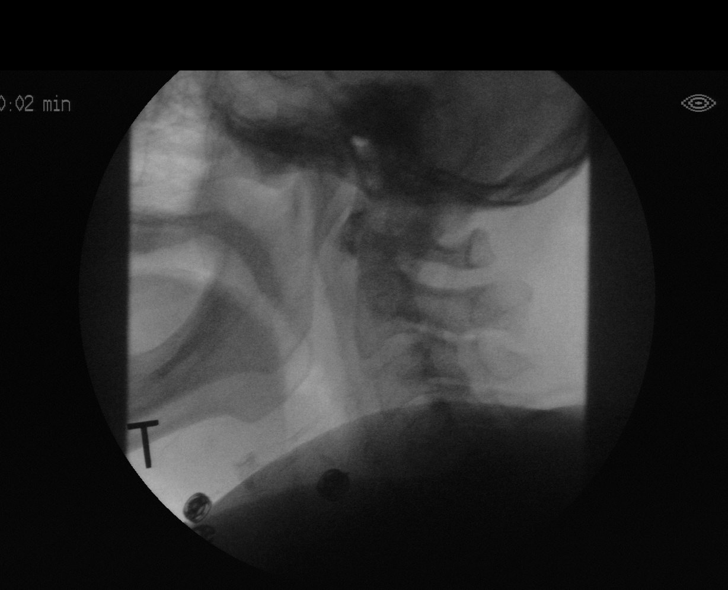

[Series 3: run · 1 of 193 frames shown (3 of 13)]
[frame 29/193]
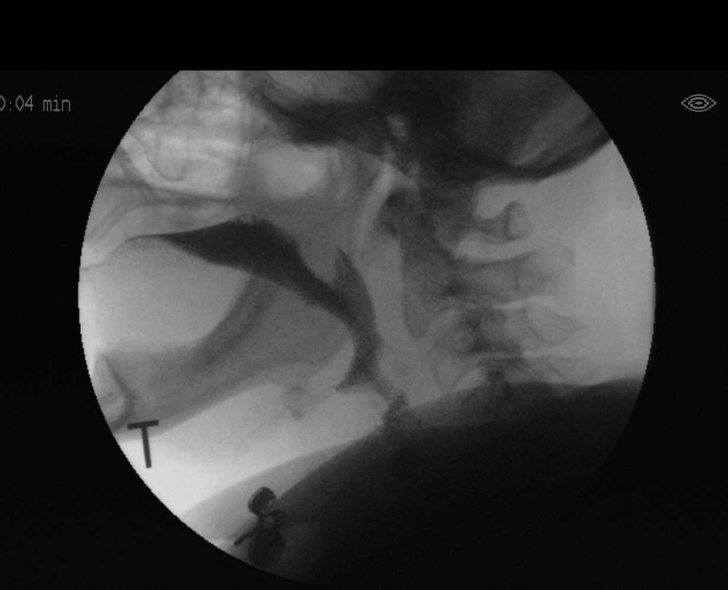

[Series 4: run · 1 of 348 frames shown (4 of 13)]
[frame 296/348]
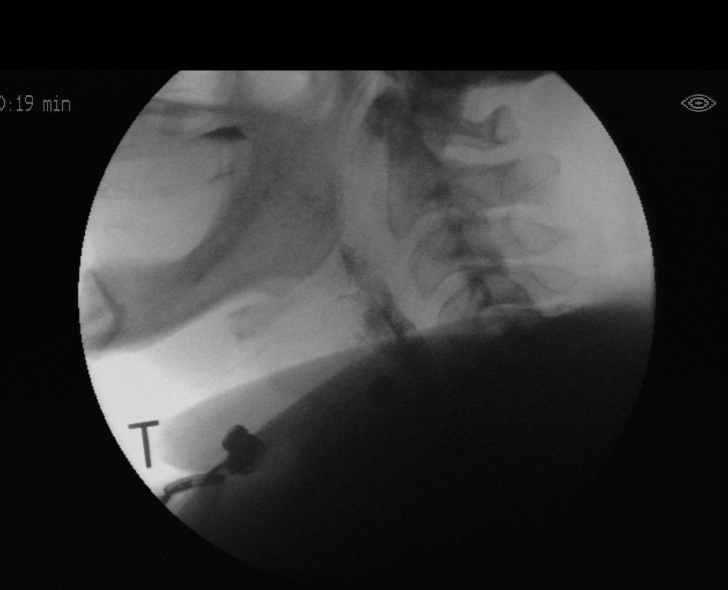

[Series 5: run · 1 of 158 frames shown (5 of 13)]
[frame 135/158]
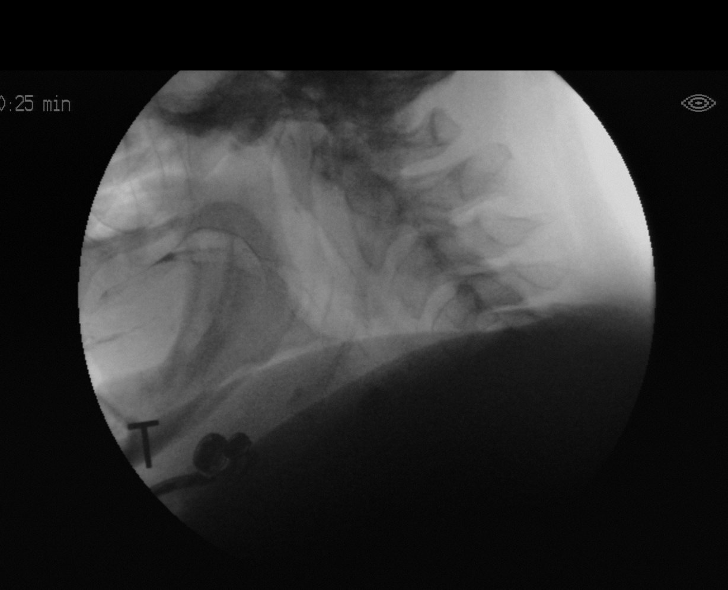

[Series 6: run · 1 of 238 frames shown (6 of 13)]
[frame 203/238]
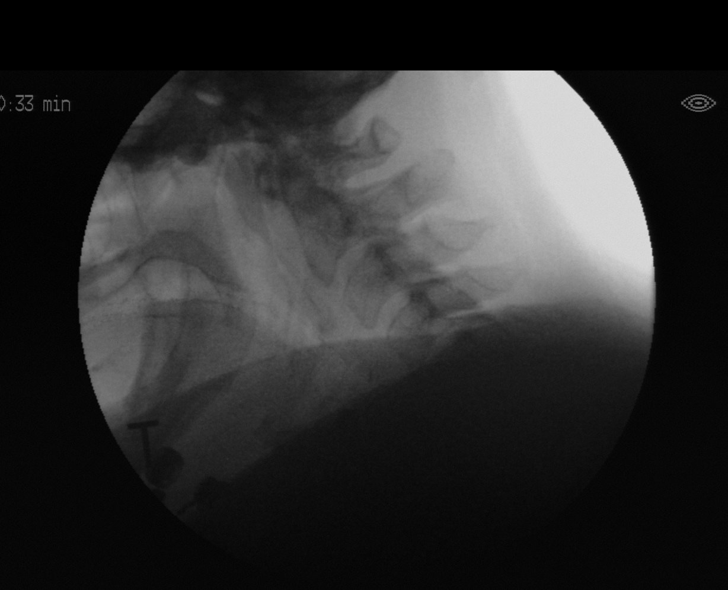

[Series 8: run · 1 of 114 frames shown (7 of 13)]
[frame 18/114]
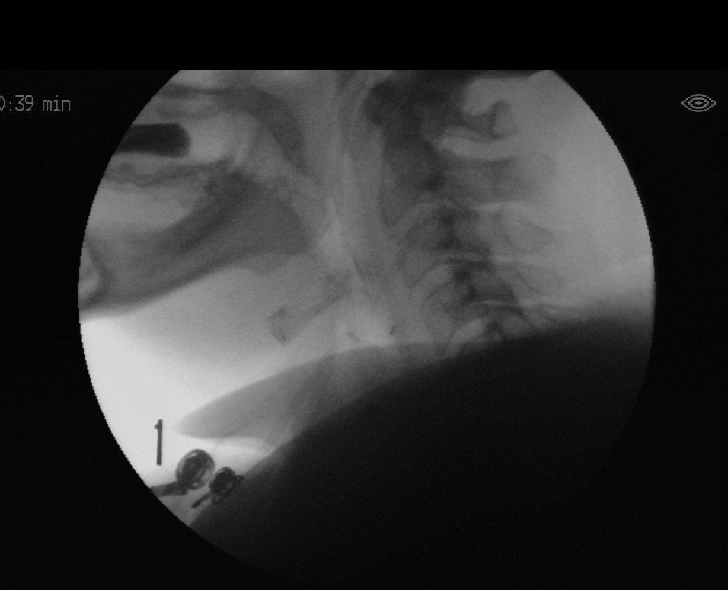

[Series 9: run · 1 of 232 frames shown (8 of 13)]
[frame 1/232]
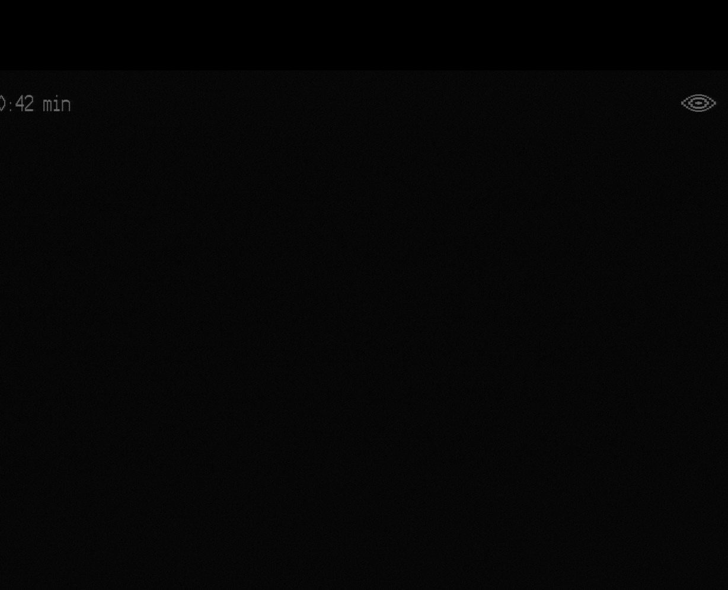

[Series 10: run · 1 of 202 frames shown (9 of 13)]
[frame 11/202]
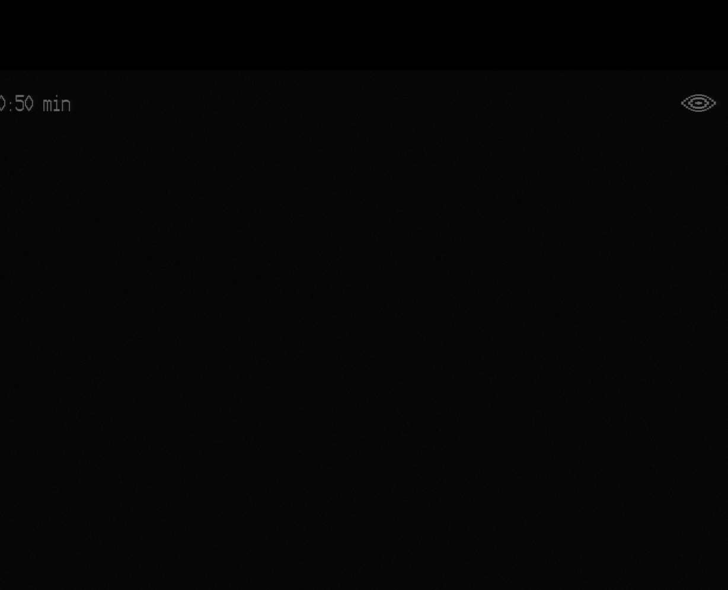

[Series 11: run · 1 of 119 frames shown (10 of 13)]
[frame 45/119]
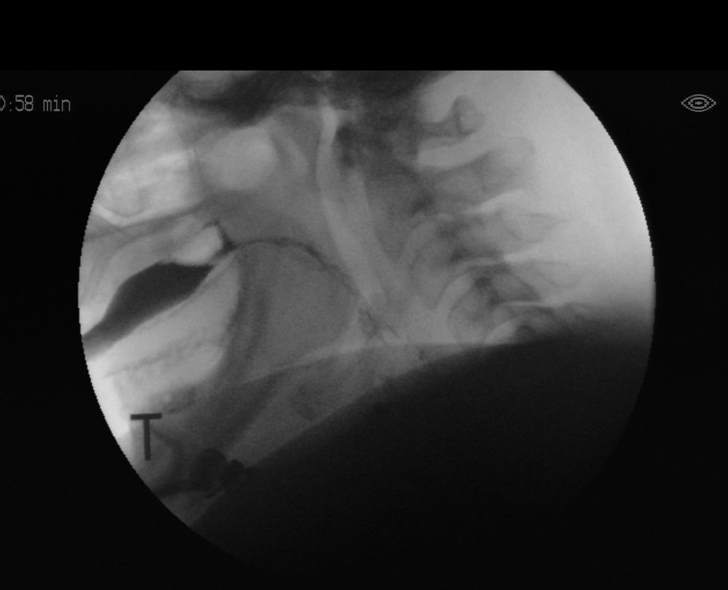

[Series 12: run · 1 of 191 frames shown (11 of 13)]
[frame 96/191]
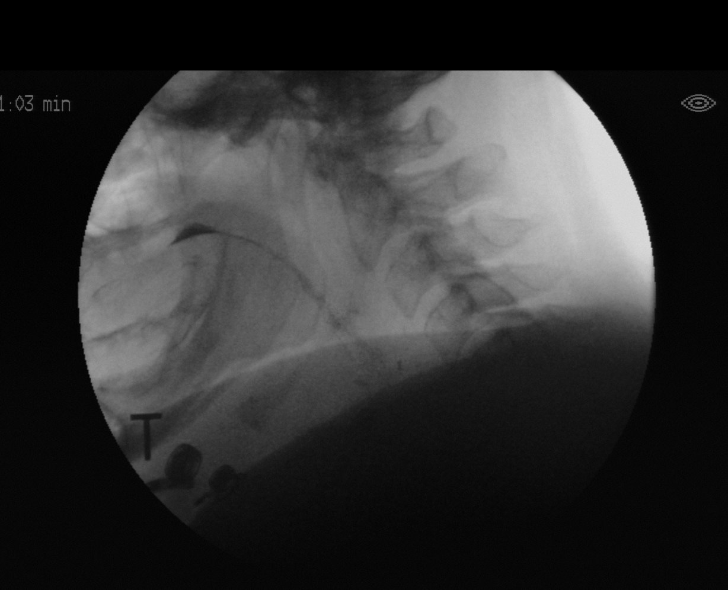

[Series 13: run · 1 of 81 frames shown (12 of 13)]
[frame 41/81]
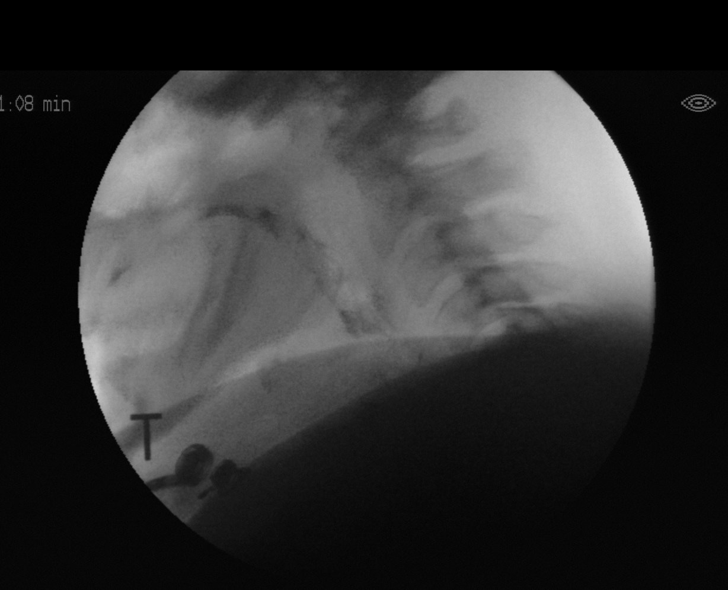

[Series 14: run · 1 of 133 frames shown (13 of 13)]
[frame 114/133]
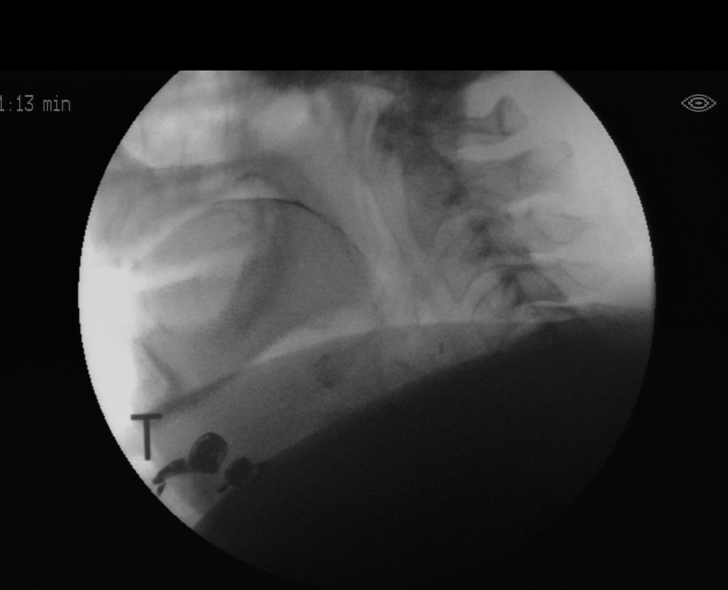

[13 of 24 positions shown; findings below may reference images not displayed]

FLUOROSCOPY FOR SWALLOWING FUNCTION STUDY:
Fluoroscopy was provided for swallowing function study, which was administered by a speech pathologist.  Final results and recommendations from this study are contained within the speech pathology report.
